# Patient Record
Sex: Female | Born: 1945 | Race: White | Smoking: Former smoker
Health system: Southern US, Community
[De-identification: ages and names within clinical notes are randomized; demographics above are authoritative.]

## PROBLEM LIST (undated history)

## (undated) DIAGNOSIS — M797 Fibromyalgia: Secondary | ICD-10-CM

## (undated) DIAGNOSIS — E785 Hyperlipidemia, unspecified: Secondary | ICD-10-CM

## (undated) DIAGNOSIS — D649 Anemia, unspecified: Secondary | ICD-10-CM

## (undated) DIAGNOSIS — E039 Hypothyroidism, unspecified: Secondary | ICD-10-CM

## (undated) DIAGNOSIS — K589 Irritable bowel syndrome without diarrhea: Secondary | ICD-10-CM

## (undated) DIAGNOSIS — F32A Depression, unspecified: Secondary | ICD-10-CM

## (undated) DIAGNOSIS — I10 Essential (primary) hypertension: Secondary | ICD-10-CM

## (undated) DIAGNOSIS — Z8719 Personal history of other diseases of the digestive system: Secondary | ICD-10-CM

## (undated) DIAGNOSIS — G709 Myoneural disorder, unspecified: Secondary | ICD-10-CM

## (undated) DIAGNOSIS — F419 Anxiety disorder, unspecified: Secondary | ICD-10-CM

## (undated) DIAGNOSIS — J449 Chronic obstructive pulmonary disease, unspecified: Secondary | ICD-10-CM

## (undated) DIAGNOSIS — F329 Major depressive disorder, single episode, unspecified: Secondary | ICD-10-CM

## (undated) DIAGNOSIS — E079 Disorder of thyroid, unspecified: Secondary | ICD-10-CM

## (undated) DIAGNOSIS — A0472 Enterocolitis due to Clostridium difficile, not specified as recurrent: Secondary | ICD-10-CM

## (undated) DIAGNOSIS — H269 Unspecified cataract: Secondary | ICD-10-CM

## (undated) HISTORY — DX: Anemia, unspecified: D64.9

## (undated) HISTORY — PX: FRACTURE SURGERY: SHX138

## (undated) HISTORY — DX: Hypothyroidism, unspecified: E03.9

## (undated) HISTORY — PX: DILATION AND CURETTAGE OF UTERUS: SHX78

## (undated) HISTORY — DX: Anxiety disorder, unspecified: F41.9

## (undated) HISTORY — DX: Personal history of other diseases of the digestive system: Z87.19

## (undated) HISTORY — PX: OTHER SURGICAL HISTORY: SHX169

## (undated) HISTORY — DX: Hyperlipidemia, unspecified: E78.5

## (undated) HISTORY — DX: Depression, unspecified: F32.A

## (undated) HISTORY — DX: Chronic obstructive pulmonary disease, unspecified: J44.9

## (undated) HISTORY — DX: Major depressive disorder, single episode, unspecified: F32.9

## (undated) HISTORY — PX: CLAVICLE SURGERY: SHX598

## (undated) HISTORY — DX: Fibromyalgia: M79.7

## (undated) HISTORY — DX: Myoneural disorder, unspecified: G70.9

## (undated) HISTORY — DX: Irritable bowel syndrome, unspecified: K58.9

---

## 2001-07-13 ENCOUNTER — Ambulatory Visit (HOSPITAL_COMMUNITY): Admission: RE | Admit: 2001-07-13 | Discharge: 2001-07-13 | Payer: Self-pay | Admitting: *Deleted

## 2002-05-09 ENCOUNTER — Ambulatory Visit (HOSPITAL_COMMUNITY): Admission: RE | Admit: 2002-05-09 | Discharge: 2002-05-09 | Payer: Self-pay | Admitting: Gastroenterology

## 2008-10-31 ENCOUNTER — Emergency Department (HOSPITAL_COMMUNITY): Admission: EM | Admit: 2008-10-31 | Discharge: 2008-10-31 | Payer: Self-pay | Admitting: Emergency Medicine

## 2009-10-15 ENCOUNTER — Ambulatory Visit: Payer: Self-pay | Admitting: Interventional Radiology

## 2009-10-15 ENCOUNTER — Emergency Department (HOSPITAL_BASED_OUTPATIENT_CLINIC_OR_DEPARTMENT_OTHER): Admission: EM | Admit: 2009-10-15 | Discharge: 2009-10-15 | Payer: Self-pay | Admitting: Emergency Medicine

## 2010-01-28 ENCOUNTER — Emergency Department (HOSPITAL_COMMUNITY): Admission: EM | Admit: 2010-01-28 | Discharge: 2010-01-28 | Payer: Self-pay | Admitting: Emergency Medicine

## 2010-02-21 ENCOUNTER — Ambulatory Visit: Payer: Self-pay | Admitting: Diagnostic Radiology

## 2010-02-21 ENCOUNTER — Emergency Department (HOSPITAL_BASED_OUTPATIENT_CLINIC_OR_DEPARTMENT_OTHER): Admission: EM | Admit: 2010-02-21 | Discharge: 2010-02-21 | Payer: Self-pay | Admitting: Emergency Medicine

## 2010-10-31 LAB — CBC
HCT: 42.6 % (ref 36.0–46.0)
HCT: 42.2 % (ref 36.0–46.0)
Hemoglobin: 14.5 g/dL (ref 12.0–15.0)
Hemoglobin: 14.7 g/dL (ref 12.0–15.0)
MCV: 91.7 fL (ref 78.0–100.0)
Platelets: 227 10*3/uL (ref 150–400)
RBC: 4.66 MIL/uL (ref 3.87–5.11)
RBC: 4.6 MIL/uL (ref 3.87–5.11)
RDW: 11.9 % (ref 11.5–15.5)

## 2010-10-31 LAB — DIFFERENTIAL
Basophils Absolute: 0.1 10*3/uL (ref 0.0–0.1)
Basophils Relative: 0 % (ref 0–1)
Basophils Relative: 1 % (ref 0–1)
Eosinophils Absolute: 0 10*3/uL (ref 0.0–0.7)
Eosinophils Relative: 0 % (ref 0–5)
Lymphocytes Relative: 10 % — ABNORMAL LOW (ref 12–46)
Lymphs Abs: 0.9 10*3/uL (ref 0.7–4.0)
Monocytes Absolute: 1 10*3/uL (ref 0.1–1.0)
Monocytes Relative: 10 % (ref 3–12)
Monocytes Relative: 7 % (ref 3–12)
Neutro Abs: 8.1 10*3/uL — ABNORMAL HIGH (ref 1.7–7.7)
Neutro Abs: 7.2 10*3/uL (ref 1.7–7.7)
Neutrophils Relative %: 80 % — ABNORMAL HIGH (ref 43–77)
Neutrophils Relative %: 82 % — ABNORMAL HIGH (ref 43–77)

## 2010-10-31 LAB — COMPREHENSIVE METABOLIC PANEL
ALT: 39 U/L — ABNORMAL HIGH (ref 0–35)
AST: 39 U/L — ABNORMAL HIGH (ref 0–37)
Albumin: 4.4 g/dL (ref 3.5–5.2)
Alkaline Phosphatase: 82 U/L (ref 39–117)
BUN: 11 mg/dL (ref 6–23)
BUN: 9 mg/dL (ref 6–23)
CO2: 27 mEq/L (ref 19–32)
CO2: 22 mEq/L (ref 19–32)
GFR calc Af Amer: >60 mL/min (ref >60)
GFR calc non Af Amer: >60 mL/min (ref >60)
Potassium: 4.3 mEq/L (ref 3.5–5.1)
Sodium: 137 mEq/L (ref 135–145)
Sodium: 137 mEq/L (ref 135–145)

## 2010-10-31 LAB — URINALYSIS, ROUTINE W REFLEX MICROSCOPIC
Bilirubin Urine: NEGATIVE
Glucose, UA: NEGATIVE mg/dL
Hgb urine dipstick: NEGATIVE
Ketones, ur: 15 mg/dL — AB
Nitrite: NEGATIVE
Protein, ur: NEGATIVE mg/dL
Specific Gravity, Urine: 1.011 (ref 1.005–1.030)

## 2010-10-31 LAB — GLUCOSE, CAPILLARY: Glucose-Capillary: 117 mg/dL — ABNORMAL HIGH (ref 70–99)

## 2010-10-31 LAB — HEMOCCULT GUIAC POC 1CARD (OFFICE): Fecal Occult Bld: NEGATIVE

## 2010-11-08 LAB — BASIC METABOLIC PANEL
BUN: 14 mg/dL (ref 6–23)
CO2: 27 mEq/L (ref 19–32)
Calcium: 9.2 mg/dL (ref 8.4–10.5)
Chloride: 105 mEq/L (ref 96–112)
Creatinine, Ser: 0.7 mg/dL (ref 0.4–1.2)
GFR calc Af Amer: >60 mL/min (ref >60)
GFR calc non Af Amer: >60 mL/min (ref >60)
Glucose, Bld: 104 mg/dL — ABNORMAL HIGH (ref 70–99)
Potassium: 4.2 mEq/L (ref 3.5–5.1)
Sodium: 142 mEq/L (ref 135–145)

## 2010-11-08 LAB — DIFFERENTIAL
Basophils Absolute: 0.2 10*3/uL — ABNORMAL HIGH (ref 0.0–0.1)
Monocytes Absolute: 1 10*3/uL (ref 0.1–1.0)
Monocytes Relative: 8 % (ref 3–12)

## 2010-11-08 LAB — CBC
HCT: 42.2 % (ref 36.0–46.0)
Hemoglobin: 14.4 g/dL (ref 12.0–15.0)
MCHC: 34.1 g/dL (ref 30.0–36.0)
MCV: 90.9 fL (ref 78.0–100.0)
Platelets: 217 10*3/uL (ref 150–400)
RBC: 4.64 MIL/uL (ref 3.87–5.11)
RDW: 12.1 % (ref 11.5–15.5)
WBC: 12.6 10*3/uL — ABNORMAL HIGH (ref 4.0–10.5)

## 2010-11-08 LAB — URINALYSIS, ROUTINE W REFLEX MICROSCOPIC
Glucose, UA: NEGATIVE mg/dL
Leukocytes, UA: NEGATIVE
Specific Gravity, Urine: 1.013 (ref 1.005–1.030)
Urobilinogen, UA: 0.2 mg/dL (ref 0.0–1.0)
pH: 5.5 (ref 5.0–8.0)

## 2010-11-08 LAB — URINE MICROSCOPIC-ADD ON

## 2010-11-08 LAB — URINE CULTURE

## 2010-11-25 LAB — CBC
HCT: 42.7 % (ref 36.0–46.0)
Platelets: 221 10*3/uL (ref 150–400)
RDW: 12.6 % (ref 11.5–15.5)
WBC: 11.1 10*3/uL — ABNORMAL HIGH (ref 4.0–10.5)

## 2010-11-25 LAB — BASIC METABOLIC PANEL
BUN: 12 mg/dL (ref 6–23)
Calcium: 9.5 mg/dL (ref 8.4–10.5)
Creatinine, Ser: 0.64 mg/dL (ref 0.4–1.2)
GFR calc non Af Amer: >60 mL/min (ref >60)
Glucose, Bld: 119 mg/dL — ABNORMAL HIGH (ref 70–99)
Potassium: 3.6 mEq/L (ref 3.5–5.1)

## 2010-11-25 LAB — DIFFERENTIAL
Basophils Absolute: 0 10*3/uL (ref 0.0–0.1)
Lymphocytes Relative: 6 % — ABNORMAL LOW (ref 12–46)
Lymphs Abs: 0.7 10*3/uL (ref 0.7–4.0)
Neutro Abs: 9.6 10*3/uL — ABNORMAL HIGH (ref 1.7–7.7)
Neutrophils Relative %: 87 % — ABNORMAL HIGH (ref 43–77)

## 2010-11-25 LAB — TROPONIN I: Troponin I: 0.01 ng/mL (ref 0.00–0.06)

## 2010-11-25 LAB — MAGNESIUM: Magnesium: 1.9 mg/dL (ref 1.5–2.5)

## 2010-11-25 LAB — BRAIN NATRIURETIC PEPTIDE: Pro B Natriuretic peptide (BNP): <30 pg/mL (ref 0.0–100.0)

## 2010-12-31 NOTE — Op Note (Signed)
   NAMEHALENA, MOHAR                         ACCOUNT NO.:  [id number]   MEDICAL RECORD NO.:  [id number]                   PATIENT TYPE:  AMB   LOCATION:  ENDO                                 FACILITY:  [facility]   PHYSICIAN:  James L. Malon Kindle., M.D.          DATE OF BIRTH:  1946-01-18   DATE OF PROCEDURE:  05/09/2002  DATE OF DISCHARGE:                                 OPERATIVE REPORT   PROCEDURE PERFORMED:  Colonoscopy.   ENDOSCOPIST:  Llana Aliment. Edwards, M.D.   MEDICATIONS:  Fentanyl 150 mcg, Versed 10 mg IV.   INSTRUMENT USED:  Pediatric Olympus video colonoscope.   INDICATIONS FOR PROCEDURE:  Rectal bleeding in a 65 year old with a very  strong family history of colon cancer.   DESCRIPTION OF PROCEDURE:  The procedure had been explained to the patient  and consent obtained.  With the patient in the left lateral decubitus  position, the pediatric Olympus video colonoscope was inserted and advanced  under direct visualization.  The prep was excellent and we were able to  reach the cecum.  The ileocecal valve and appendiceal orifice were seen.  The scope was withdrawn and the cecum, ascending colon, hepatic flexure,  transverse colon, splenic flexure, descending and sigmoid colon were seen  well.  Moderate diverticulosis throughout the entire left colon.  The scope  was withdrawn down to the rectum.  Internal hemorrhoids in the rectum.  The  scope was withdrawn.  The patient tolerated the procedure well, maintained  on low-flow oxygen and pulse oximeter throughout the procedure.   ASSESSMENT:  1. Rectal bleeding probably due to internal hemorrhoids.  2. Diverticulosis.  3. Family history of colon cancer.   PLAN:  Will follow up in the office as needed.  Give hemorrhoid sheet and  recommend repeating in five years.                                                James L. Malon Kindle., M.D.    Waldron Session  D:  05/09/2002  T:  05/09/2002  Job:  [id number]   cc:   Molly Maduro L.  Foy Gu[place], M.D.  [address] [place]  [place] [address]  Fax: [phone]

## 2011-02-28 IMAGING — CR DG CHEST 2V
2 series · 2 of 2 positions shown · non-contrast
Comparison: None

CLINICAL DATA: Short of breath and dizziness.

CHEST - 2 VIEW

[w chest pa]
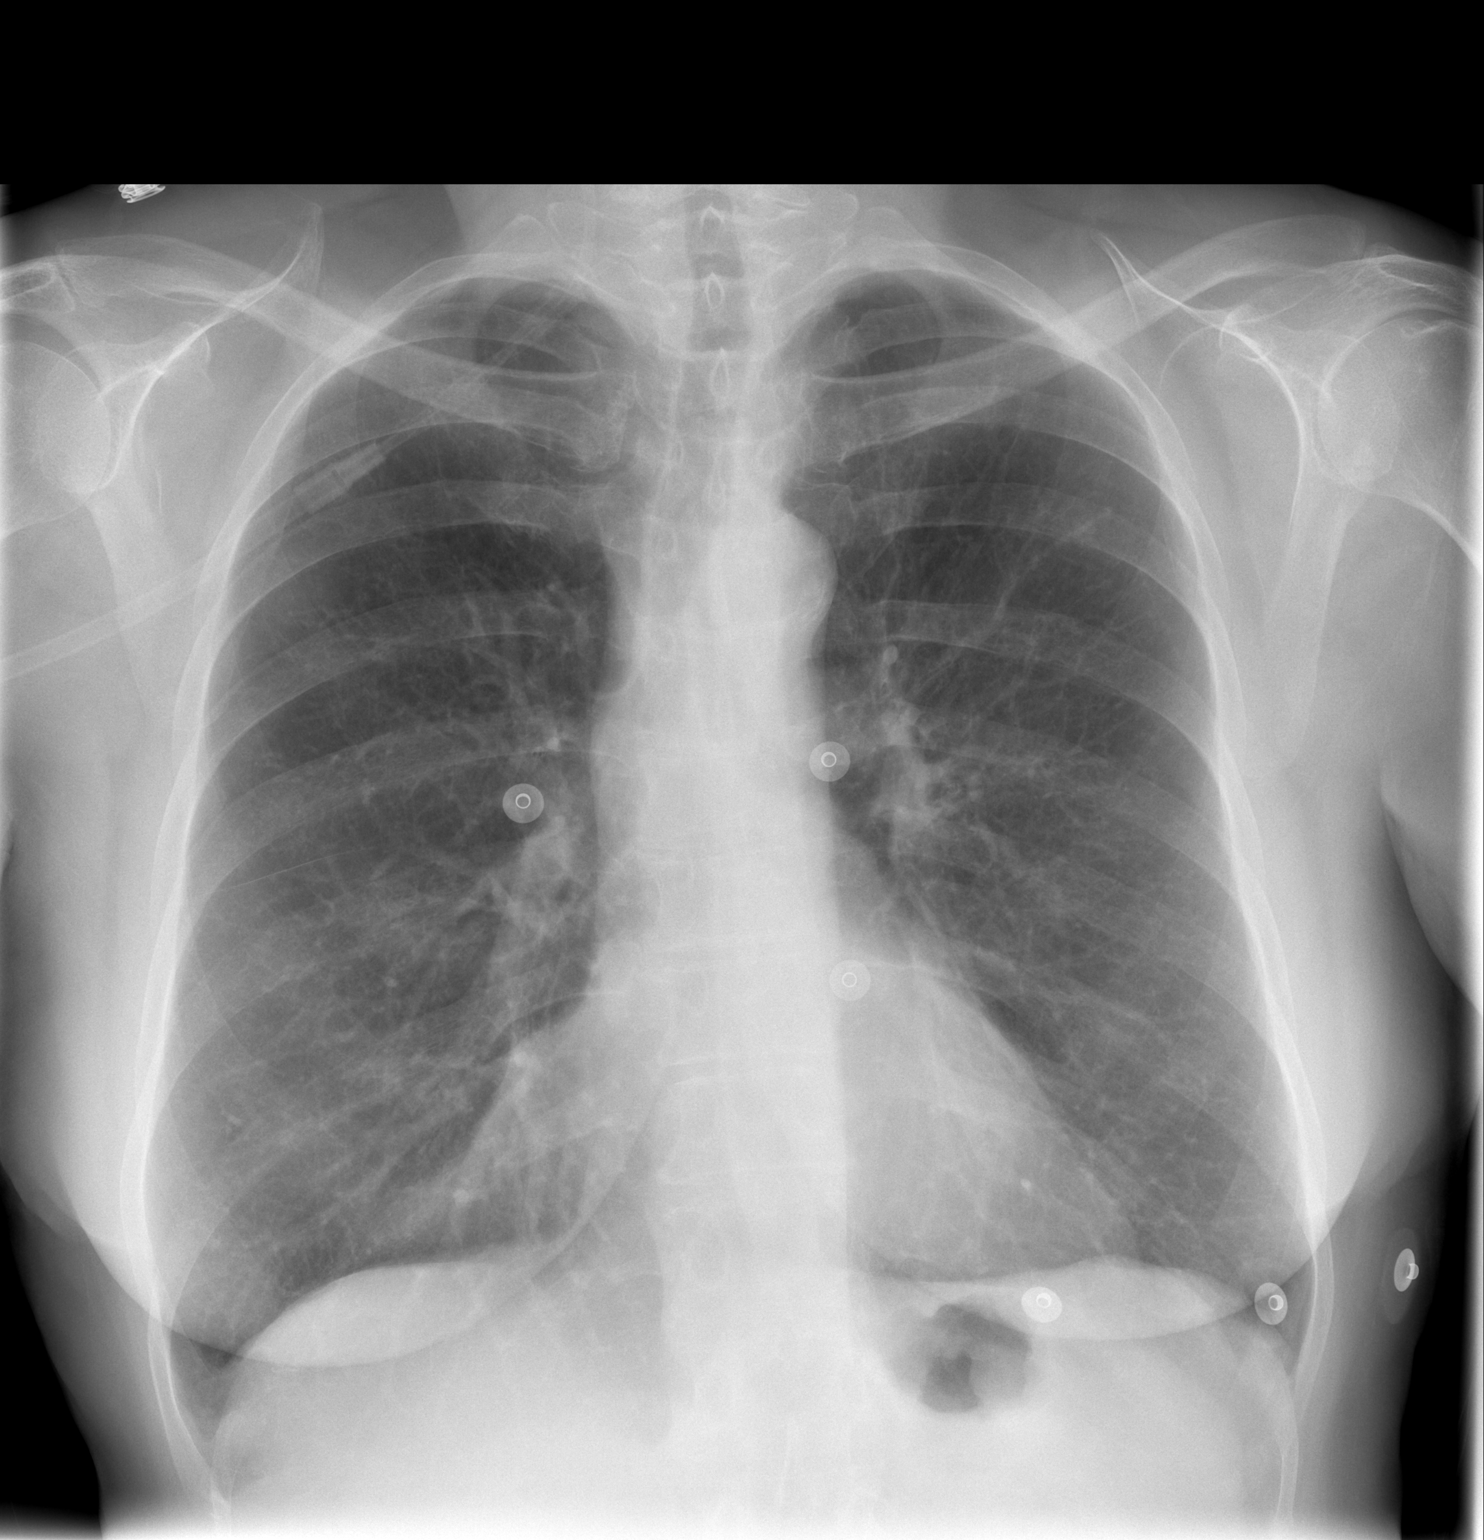

[w chest lat]
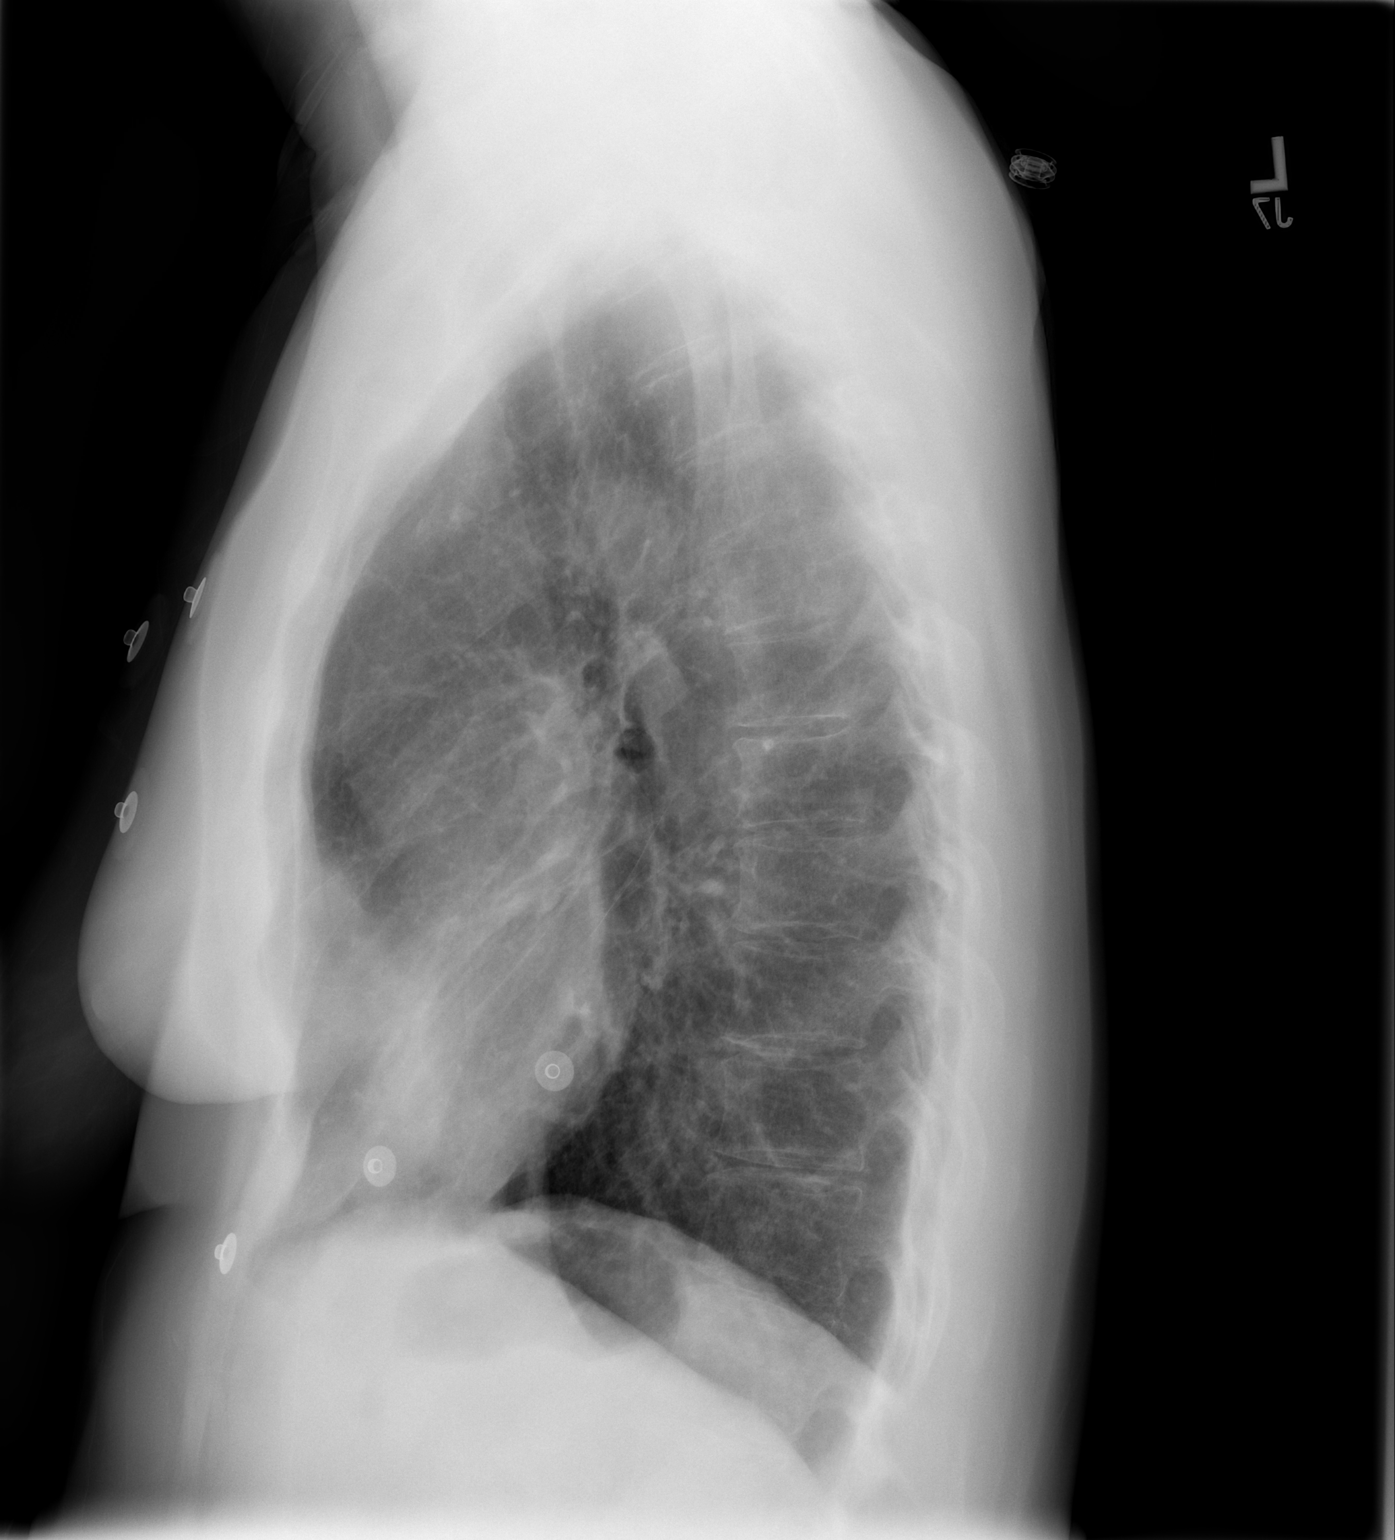

[2 of 2 positions shown; findings below may reference images not displayed]

FINDINGS: Heart size is normal and the vascularity is normal.  The
lungs show mild hyperinflation due to chronic lung disease.
Question early infiltrate in the right middle lobe best seen on the
lateral view.  No prior studies available for comparison to
determine if this is an acute finding.
IMPRESSION: COPD.

Possible early infiltrate in the right middle lobe.  This could
also be due to scarring or atelectasis.

## 2013-04-12 ENCOUNTER — Encounter: Payer: Self-pay | Admitting: *Deleted

## 2013-04-12 ENCOUNTER — Emergency Department (INDEPENDENT_AMBULATORY_CARE_PROVIDER_SITE_OTHER)
Admission: EM | Admit: 2013-04-12 | Discharge: 2013-04-12 | Disposition: A | Discharge status: Home / Self Care | Payer: Medicare Other | Source: Home / Self Care | Attending: Family Medicine | Admitting: Family Medicine

## 2013-04-12 ENCOUNTER — Emergency Department (INDEPENDENT_AMBULATORY_CARE_PROVIDER_SITE_OTHER): Payer: Medicare Other

## 2013-04-12 ENCOUNTER — Ambulatory Visit (INDEPENDENT_AMBULATORY_CARE_PROVIDER_SITE_OTHER): Payer: Medicare Other | Admitting: Sports Medicine

## 2013-04-12 DIAGNOSIS — M25519 Pain in unspecified shoulder: Secondary | ICD-10-CM

## 2013-04-12 DIAGNOSIS — M7710 Lateral epicondylitis, unspecified elbow: Secondary | ICD-10-CM

## 2013-04-12 DIAGNOSIS — M19019 Primary osteoarthritis, unspecified shoulder: Secondary | ICD-10-CM

## 2013-04-12 DIAGNOSIS — M778 Other enthesopathies, not elsewhere classified: Secondary | ICD-10-CM

## 2013-04-12 DIAGNOSIS — M25529 Pain in unspecified elbow: Secondary | ICD-10-CM

## 2013-04-12 DIAGNOSIS — M25511 Pain in right shoulder: Secondary | ICD-10-CM

## 2013-04-12 DIAGNOSIS — M7711 Lateral epicondylitis, right elbow: Secondary | ICD-10-CM | POA: Insufficient documentation

## 2013-04-12 HISTORY — DX: Disorder of thyroid, unspecified: E07.9

## 2013-04-12 HISTORY — DX: Essential (primary) hypertension: I10

## 2013-04-12 MED ORDER — MELOXICAM 15 MG PO TABS: ORAL_TABLET | ORAL | Status: DC

## 2013-04-12 NOTE — ED Notes (Addendum)
[place] c/o right arm and elbow pain x 2 weeks without injury. Pain is constant and worse with movement.

## 2013-04-12 NOTE — Progress Notes (Signed)
   Subjective:    I'm seeing this patient as a consultation for:  Dr. Alvester Morin  CC: Right elbow pain  HPI: This is a very pleasant 67 year old female who I've been asked to consult on by Dr. Alvester Morin, she has had a several week history of pain that she localizes over her right lateral upper condyle, without any change in her usual level of activity. Pain is localized, doesn't radiate, mild to moderate. She has not yet tried any medications for this. No pain in the neck, no numbness or tingling in the fingers. She also has some mild pain over the deltoid in her shoulder with overhead activities that she would like to discuss it later date.  Past medical history, Surgical history, Family history not pertinant except as noted below, Social history, Allergies, and medications have been entered into the medical record, reviewed, and no changes needed.   Review of Systems: No headache, visual changes, nausea, vomiting, diarrhea, constipation, dizziness, abdominal pain, skin rash, fevers, chills, night sweats, weight loss, swollen lymph nodes, body aches, joint swelling, muscle aches, chest pain, shortness of breath, mood changes, visual or auditory hallucinations.   Objective:   General: Well Developed, well nourished, and in no acute distress.  Neuro/Psych: Alert and oriented x3, extra-ocular muscles intact, able to move all 4 extremities, sensation grossly intact. Skin: Warm and dry, no rashes noted.  Respiratory: Not using accessory muscles, speaking in full sentences, trachea midline.  Cardiovascular: Pulses palpable, no extremity edema. Abdomen: Does not appear distended. Right Elbow: Unremarkable to inspection. Range of motion full pronation, supination, flexion, extension. Strength is full to all of the above directions Stable to varus, valgus stress. Negative moving valgus stress test. Tender to palpation over the common extensor tendon origin. Ulnar nerve does not sublux. Negative cubital  tunnel Tinel's.  X-rays were reviewed and are negative.  Impression and Recommendations:   This case required medical decision making of moderate complexity.

## 2013-04-12 NOTE — Assessment & Plan Note (Signed)
We will start conservatively with an elbow brace, Mobic, home exercises, she has some Lidoderm patches but she wants to put on which I think is appropriate. She'll come back to see me on an as-needed basis in 2 weeks and we can consider injection if no better.  She also had some right shoulder pain which we can address it at future visit.

## 2013-04-12 NOTE — ED Provider Notes (Signed)
CSN: [id number]     Arrival date & time 04/12/13  1310 History   First MD Initiated Contact with Patient 04/12/13 1311     Chief Complaint  Patient presents with  . Arm Pain    HPI  R elbow and shoulder pain x 1 week  No known injury.  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  Pain predominantly in R lateral elbow.  Minimal radicular sxs.  Grip strength intact.   Past Medical History  Diagnosis Date  . Hypertension   . Thyroid disease    History reviewed. No pertinent past surgical history. Family History  Problem Relation Age of Onset  . Cancer Mother     breast CA  . Diabetes Father   . Hypertension Father    History  Substance Use Topics  . Smoking status: Never Smoker   . Smokeless tobacco: Never Used  . Alcohol Use: No   OB History   Grav Para Term Preterm Abortions TAB SAB Ect Mult Living                 Review of Systems  All other systems reviewed and are negative.    Allergies  Demerol and Erythromycin  Home Medications   Current Outpatient Rx  Name  Route  Sig  Dispense  Refill  . busPIRone (BUSPAR) 15 MG tablet   Oral   Take 15 mg by mouth 3 (three) times daily.         . hydrALAZINE (APRESOLINE) 10 MG tablet   Oral   Take 10 mg by mouth 3 (three) times daily.         Marland Kitchen levothyroxine (SYNTHROID, LEVOTHROID) 50 MCG tablet   Oral   Take 50 mcg by mouth daily before breakfast.         . losartan (COZAAR) 100 MG tablet   Oral   Take 100 mg by mouth daily.          BP 176/75  Pulse 95  Resp 14  Ht 5' 7.5" (1.715 m)  Wt 163 lb (73.936 kg)  BMI 25.14 kg/m2  SpO2 100% Physical Exam  Constitutional: She appears well-developed and well-nourished.  HENT:  Head: Normocephalic and atraumatic.  Eyes: Conjunctivae are normal. Pupils are equal, round, and reactive to light.  Neck: Normal range of motion.  Cardiovascular: Regular rhythm.   Pulmonary/Chest: Effort normal and breath sounds normal.  Abdominal: Soft.  Musculoskeletal:     Arms: Neurological: She is alert.    ED Course  Procedures (including critical care time) Labs Review Labs Reviewed - No data to display Imaging Review Dg Shoulder Right  04/12/2013   CLINICAL DATA:  Shoulder pain.  EXAM: RIGHT SHOULDER - 2+ VIEW  COMPARISON:  None.  FINDINGS: The bones appear mildly demineralized. There is no evidence of acute fracture or dislocation. The subacromial space is preserved. There are acromioclavicular degenerative changes.  IMPRESSION: Acromioclavicular degenerative changes. No acute osseous findings or malalignment.   Electronically Signed   By: Roxy Horseman   On: 04/12/2013 14:06   Dg Elbow Complete Right  04/12/2013   CLINICAL DATA:  Elbow pain for 2 weeks. No known injury.  EXAM: RIGHT ELBOW - COMPLETE 3+ VIEW  COMPARISON:  None.  FINDINGS: The bones appear mildly demineralized. The joint spaces are preserved. There is no evidence of acute fracture, dislocation or elbow joint effusion.  IMPRESSION: No acute osseous findings.   Electronically Signed   By: Roxy Horseman   On:  04/12/2013 14:07    MDM   1. Pain in joint, shoulder region, right   2. Lateral epicondylitis  of elbow   3. Triceps tendonitis    Noted multi joint/tendon involvement.  Likely over use injury.  Pt reports multiple medication allergies including NSAIDs.  Will consult sports medicine to help with management.  Treatment and follow up per sports medicine.     The patient and/or caregiver has been counseled thoroughly with regard to treatment plan and/or medications prescribed including dosage, schedule, interactions, rationale for use, and possible side effects and they verbalize understanding. Diagnoses and expected course of recovery discussed and will return if not improved as expected or if the condition worsens. Patient and/or caregiver verbalized understanding.         Doree Albee, MD 04/12/13 [phone]

## 2013-07-16 ENCOUNTER — Encounter (HOSPITAL_BASED_OUTPATIENT_CLINIC_OR_DEPARTMENT_OTHER): Payer: Self-pay | Admitting: Emergency Medicine

## 2013-07-16 ENCOUNTER — Emergency Department (HOSPITAL_BASED_OUTPATIENT_CLINIC_OR_DEPARTMENT_OTHER)
Admission: EM | Admit: 2013-07-16 | Discharge: 2013-07-16 | Disposition: A | Discharge status: Home / Self Care | Payer: Medicare Other | Attending: Emergency Medicine | Admitting: Emergency Medicine

## 2013-07-16 ENCOUNTER — Emergency Department (HOSPITAL_BASED_OUTPATIENT_CLINIC_OR_DEPARTMENT_OTHER): Payer: Medicare Other

## 2013-07-16 DIAGNOSIS — IMO0002 Reserved for concepts with insufficient information to code with codable children: Secondary | ICD-10-CM | POA: Insufficient documentation

## 2013-07-16 DIAGNOSIS — Z79899 Other long term (current) drug therapy: Secondary | ICD-10-CM | POA: Insufficient documentation

## 2013-07-16 DIAGNOSIS — J40 Bronchitis, not specified as acute or chronic: Secondary | ICD-10-CM

## 2013-07-16 DIAGNOSIS — R079 Chest pain, unspecified: Secondary | ICD-10-CM | POA: Insufficient documentation

## 2013-07-16 DIAGNOSIS — E079 Disorder of thyroid, unspecified: Secondary | ICD-10-CM | POA: Insufficient documentation

## 2013-07-16 DIAGNOSIS — I10 Essential (primary) hypertension: Secondary | ICD-10-CM | POA: Insufficient documentation

## 2013-07-16 LAB — TROPONIN I: Troponin I: <0.3 ng/mL (ref <0.30)

## 2013-07-16 LAB — CBC WITH DIFFERENTIAL/PLATELET
Eosinophils Absolute: 0 10*3/uL (ref 0.0–0.7)
Hemoglobin: 13.9 g/dL (ref 12.0–15.0)
Lymphs Abs: 1.2 10*3/uL (ref 0.7–4.0)
MCH: 31.2 pg (ref 26.0–34.0)
Monocytes Relative: 10 % (ref 3–12)
Neutro Abs: 6.8 10*3/uL (ref 1.7–7.7)
Neutrophils Relative %: 76 % (ref 43–77)
Platelets: 232 10*3/uL (ref 150–400)
RBC: 4.46 MIL/uL (ref 3.87–5.11)
WBC: 9 10*3/uL (ref 4.0–10.5)

## 2013-07-16 LAB — BASIC METABOLIC PANEL
BUN: 15 mg/dL (ref 6–23)
Chloride: 101 mEq/L (ref 96–112)
GFR calc Af Amer: >90 mL/min (ref >90)
GFR calc non Af Amer: 87 mL/min — ABNORMAL LOW (ref >90)
Glucose, Bld: 113 mg/dL — ABNORMAL HIGH (ref 70–99)
Potassium: 3.7 mEq/L (ref 3.5–5.1)
Sodium: 137 mEq/L (ref 135–145)

## 2013-07-16 MED ORDER — PREDNISONE 20 MG PO TABS: 60.0000 mg | ORAL_TABLET | Freq: Every day | ORAL | Status: DC

## 2013-07-16 MED ORDER — IOHEXOL 350 MG/ML SOLN
100.0000 mL | Freq: Once | INTRAVENOUS | Status: AC | PRN
  Administered 2013-07-16: 100 mL via INTRAVENOUS

## 2013-07-16 MED ORDER — PREDNISONE 50 MG PO TABS
60.0000 mg | ORAL_TABLET | Freq: Once | ORAL | Status: AC
  Administered 2013-07-16: 60 mg via ORAL
  Filled 2013-07-16 (×2): qty 1

## 2013-07-16 MED ORDER — ALBUTEROL SULFATE HFA 108 (90 BASE) MCG/ACT IN AERS
2.0000 | INHALATION_SPRAY | Freq: Once | RESPIRATORY_TRACT | Status: AC
  Administered 2013-07-16: 2 via RESPIRATORY_TRACT
  Filled 2013-07-16: qty 6.7

## 2013-07-16 MED ORDER — ALBUTEROL SULFATE HFA 108 (90 BASE) MCG/ACT IN AERS: 2.0000 | INHALATION_SPRAY | RESPIRATORY_TRACT | Status: DC | PRN

## 2013-07-16 MED ORDER — ONDANSETRON HCL 4 MG/2ML IJ SOLN
4.0000 mg | Freq: Once | INTRAMUSCULAR | Status: AC
  Administered 2013-07-16: 4 mg via INTRAVENOUS
  Filled 2013-07-16: qty 2

## 2013-07-16 NOTE — ED Notes (Signed)
Pt returns from ct scan, states she feels "shakey". Pt given warm blankets, denies any other c/o..."nothing other than my usual aches and pains that I have every day.Marland KitchenMarland Kitchen"

## 2013-07-16 NOTE — ED Notes (Signed)
Patient transported to CT 

## 2013-07-16 NOTE — ED Notes (Signed)
Assisted Pt to restroom, Pt ambulated to restroom and then I place Pt in wheel chair from the restroom to Pts room

## 2013-07-16 NOTE — ED Provider Notes (Signed)
CSN: [id number]     Arrival date & time 07/16/13  1403 History   First MD Initiated Contact with Patient 07/16/13 1500     Chief Complaint  Patient presents with  . Shortness of Breath  . pain under left breast radiates to back    (Consider location/radiation/quality/duration/timing/severity/associated sxs/prior Treatment) Patient is a 67 y.o. female presenting with shortness of breath.  Shortness of Breath Associated symptoms: chest pain   Associated symptoms: no abdominal pain, no cough, no fever and no vomiting     This is a 67 year old female who presents with shortness of breath and chest pain. Patient reports onset of pain last night at approximately 10 PM. She reports pain under her left breast that radiates to her back. It is pleuritic and sharp. Patient also reports left lower extremity "charley horses." She denies any swelling or history of blood clots but does have family history of blood clots. Patient has a history of hypertension and an early family history of heart disease. She is a former smoker. Currently she rates her pain a 6/10.  She denies any recent fevers or cough.  Past Medical History  Diagnosis Date  . Hypertension   . Thyroid disease    History reviewed. No pertinent past surgical history. Family History  Problem Relation Age of Onset  . Cancer Mother     breast CA  . Diabetes Father   . Hypertension Father    History  Substance Use Topics  . Smoking status: Never Smoker   . Smokeless tobacco: Never Used  . Alcohol Use: No   OB History   Grav Para Term Preterm Abortions TAB SAB Ect Mult Living                 Review of Systems  Constitutional: Negative for fever.  Respiratory: Positive for shortness of breath. Negative for cough.   Cardiovascular: Positive for chest pain. Negative for leg swelling.  Gastrointestinal: Negative for nausea, vomiting and abdominal pain.  Genitourinary: Negative for dysuria.    Allergies  Clindamycin/lincomycin;  Demerol; Erythromycin; and Sulfa antibiotics  Home Medications   Current Outpatient Rx  Name  Route  Sig  Dispense  Refill  . cyclobenzaprine (FLEXERIL) 10 MG tablet   Oral   Take 10 mg by mouth 3 (three) times daily as needed for muscle spasms.         Marland Kitchen albuterol (PROVENTIL HFA;VENTOLIN HFA) 108 (90 BASE) MCG/ACT inhaler   Inhalation   Inhale 2 puffs into the lungs every 4 (four) hours as needed for wheezing or shortness of breath.   1 Inhaler   0   . busPIRone (BUSPAR) 15 MG tablet   Oral   Take 15 mg by mouth 3 (three) times daily.         . hydrALAZINE (APRESOLINE) 10 MG tablet   Oral   Take 10 mg by mouth 3 (three) times daily.         Marland Kitchen levothyroxine (SYNTHROID, LEVOTHROID) 50 MCG tablet   Oral   Take 50 mcg by mouth daily before breakfast.         . losartan (COZAAR) 100 MG tablet   Oral   Take 100 mg by mouth daily.         . meloxicam (MOBIC) 15 MG tablet      One tab PO qAM with breakfast for 2 weeks, then daily prn pain.   30 tablet   3   . predniSONE (DELTASONE) 20 MG tablet  Oral   Take 3 tablets (60 mg total) by mouth daily.   12 tablet   0    BP 174/83  Pulse 89  Temp(Src) 98 F (36.7 C) (Oral)  Resp 20  Ht 5\' 7"  (1.702 m)  Wt 162 lb (73.483 kg)  BMI 25.37 kg/m2  SpO2 95% Physical Exam  Nursing note and vitals reviewed. Constitutional: She is oriented to person, place, and time. She appears well-developed and well-nourished.  HENT:  Head: Normocephalic and atraumatic.  Eyes: Pupils are equal, round, and reactive to light.  Neck: Neck supple.  Cardiovascular: Normal rate, regular rhythm and normal heart sounds.   No murmur heard. Pulmonary/Chest: Effort normal. No respiratory distress. She has wheezes.  Scant expiratory wheezing in the bases  Abdominal: Soft. Bowel sounds are normal. There is no tenderness. There is no rebound.  Musculoskeletal: She exhibits no edema.  Tenderness to palpation of the left calf, no  asymmetrical swelling noted, good DP pulses  Neurological: She is alert and oriented to person, place, and time.  Skin: Skin is warm and dry.  Psychiatric: She has a normal mood and affect.    ED Course  Procedures (including critical care time) Labs Review Labs Reviewed  BASIC METABOLIC PANEL - Abnormal; Notable for the following:    Glucose, Bld 113 (*)    GFR calc non Af Amer 87 (*)    All other components within normal limits  CBC WITH DIFFERENTIAL  TROPONIN I  TROPONIN I   Imaging Review Dg Chest 2 View  07/16/2013   CLINICAL DATA:  History of hypertension now with chest pain and dyspnea  EXAM: CHEST  2 VIEW  COMPARISON:  Chest x-ray of November 10, 2012  FINDINGS: The lungs remain hyperinflated consistent with COPD. The cardiac silhouette is normal in size. The pulmonary vascularity is not engorged. The mediastinum is normal in width. Stable coarse lung markings are present in the right infrahilar region most compatible with pulmonary vessels. There is no pleural effusion or pneumothorax. The cardiopericardial silhouette is normal in size. The trachea is midline. The observed portions of the bony thorax exhibit no acute abnormalities.  IMPRESSION: There is hyperinflation consistent with COPD. There is no evidence of pneumonia nor CHF. I cannot exclude acute bronchitis in the appropriate clinical setting.   Electronically Signed   By: David  [place]   On: 07/16/2013 15:05   Ct Angio Chest W/cm &/or Wo Cm  07/16/2013   CLINICAL DATA:  Chest pain, back pain, and shortness of breath.  EXAM: CT ANGIOGRAPHY CHEST WITH CONTRAST  TECHNIQUE: Multidetector CT imaging of the chest was performed using the standard protocol during bolus administration of intravenous contrast. Multiplanar CT image reconstructions including MIPs were obtained to evaluate the vascular anatomy.  CONTRAST:  OMNIPAQUE IOHEXOL 350 MG/ML SOLN  COMPARISON:  Chest x-ray dated 07/16/2013  FINDINGS: There are no pulmonary  emboli, infiltrates, or effusions. RV/ LV ratio is normal at 0.77. The patient has diffuse emphysematous changes primarily in the upper lobes. Heart size is normal. No appreciable coronary artery calcification.  No acute osseous abnormality.  Degenerative disc disease at T8-9.  Tiny intrapulmonary nodes are seen along the left hemidiaphragm and along the right minor fissure. These are not felt to be significant. No adenopathy or mass lesions. Right lobe of the thyroid gland is slightly prominent but there is no definable nodule.  Review of the MIP images confirms the above findings.  IMPRESSION: 1. No pulmonary emboli or other acute  abnormalities. 2. Emphysema.   Electronically Signed   By: Geanie Cooley M.D.   On: 07/16/2013 17:51    EKG Interpretation    Date/Time:  Tuesday July 16 2013 14:23:46 EST Ventricular Rate:  103 PR Interval:  150 QRS Duration: 76 QT Interval:  338 QTC Calculation: 442 R Axis:   80 Text Interpretation:  Sinus tachycardia Biatrial enlargement Abnormal ECG Tachycardia present now when compared to prior Confirmed by Allaya Abbasi  MD, Hien Cunliffe ([id number]) on 07/16/2013 11:34:27 PM            MDM   1. Bronchitis   2. Chest pain    Patient presents with shortness of breath and chest pain. She is nontoxic-appearing on exam. Initial vital signs are notable for pulse of 102 and a blood pressure of 191/80. Considerations include PE, ACS, COPD (although the patient denies any formal diagnosis of COPD).  Patient has a moderate Wells risk score. For this reason she was sent for T. CT scan. Initial troponin is negative. EKG is nonischemic and shows sinus tachycardia. Chest x-ray shows evidence of hyperinflation consistent with COPD. CT scan is negative for PE but did show evidence of emphysema.  Patient was given albuterol and prednisone. Feel her chest pain may be secondary to COPD exacerbation given wheezing and imaging evidence of emphysema. Delta troponin is reassuring.   Patient has risk factors of early family history and hypertension. Her TIMI risk score is 1. I discussed with the patient and her family that I felt her chest pain shortness of breath was likely secondary to bronchitis in the setting of COPD. However, she does have risk factors for ACS and I feel she should have close followup with stress testing. Patient states understanding. She will call cardiology tomorrow to set up stress testing. Patient was given prednisone and albuterol for discharge. She was also instructed to followup with her primary care physician regarding formal testing for COPD.  After history, exam, and medical workup I feel the patient has been appropriately medically screened and is safe for discharge home. Pertinent diagnoses were discussed with the patient. Patient was given return precautions.     Shon Baton, MD 07/16/13 2337

## 2013-07-25 ENCOUNTER — Ambulatory Visit (INDEPENDENT_AMBULATORY_CARE_PROVIDER_SITE_OTHER): Payer: Medicare Other | Admitting: Cardiology

## 2013-07-25 ENCOUNTER — Encounter: Payer: Self-pay | Admitting: Cardiology

## 2013-07-25 VITALS — BP 166/72 | HR 80 | Ht 67.0 in | Wt 163.0 lb

## 2013-07-25 DIAGNOSIS — R079 Chest pain, unspecified: Secondary | ICD-10-CM

## 2013-07-25 DIAGNOSIS — I82409 Acute embolism and thrombosis of unspecified deep veins of unspecified lower extremity: Secondary | ICD-10-CM

## 2013-07-25 MED ORDER — FLUTICASONE-SALMETEROL 250-50 MCG/DOSE IN AEPB: INHALATION_SPRAY | RESPIRATORY_TRACT | Status: DC

## 2013-07-25 MED ORDER — CLONIDINE HCL 0.2 MG PO TABS: 0.2000 mg | ORAL_TABLET | Freq: Two times a day (BID) | ORAL | Status: DC

## 2013-07-25 NOTE — Patient Instructions (Addendum)
Your physician has requested that you have en exercise stress myoview. For further information please visit [email or web address] Please follow instruction sheet, as given.  Your physician has recommended you make the following change in your medication: inhale 2 puffs of Advair twice daily and take Clonidine 0.2 mg twice daily  Your physician has requested that you have a lower or upper extremity venous duplex. This test is an ultrasound of the veins in the legs or arms. It looks at venous blood flow that carries blood from the heart to the legs or arms. Allow one hour for a Lower Venous exam. Allow thirty minutes for an Upper Venous exam. There are no restrictions or special instructions.  Your physician recommends that you schedule a follow-up appointment in: 3 weeks

## 2013-07-25 NOTE — Progress Notes (Signed)
Patient ID: Kalei Meda, female   DOB: 1945-11-15, 67 y.o.   MRN: [id number]     Patient Name: Megan Glover Date of Encounter: 07/25/2013  Primary Care Provider:  Lenora Boys, MD Primary Cardiologist:  Tobias Alexander, H   Problem List   Past Medical History  Diagnosis Date  . Hypertension   . Thyroid disease    No past surgical history on file.  Allergies  Allergies  Allergen Reactions  . Clindamycin/Lincomycin   . Demerol [Meperidine]   . Erythromycin   . Sulfa Antibiotics     HPI  This is a 67 year old female with poorly controlled HTN who presented to the ER on 07/12/13 with with shortness of breath and chest pain. The pain started at rest, it was under her left breast that radiated to her back. It was pleuritic and sharp. Patient also reports left lower extremity "charley horses" and left lower extremity pain. She denies any swelling or history of blood clots but does have family history of blood clots. CT chest in the ER was negative for pulmonary embolism. Patient has a history of hypertension and an early family history of heart disease. She is a former smoker.   Her pain resolved in the ER, she was found to be wheezing and started on Prednisone and Albuterol for COPD exacerbation. The patient complains of severely elevated BP and states that she stopped taking Prednisone as she felt it was elevating her BP.  She admits to DOE after mild exertion. She has episodic left sided sharp chest pains not related to exertion.    Home Medications  Prior to Admission medications   Medication Sig Start Date End Date Taking? Authorizing Provider  albuterol (PROVENTIL HFA;VENTOLIN HFA) 108 (90 BASE) MCG/ACT inhaler Inhale 2 puffs into the lungs every 4 (four) hours as needed for wheezing or shortness of breath. 07/16/13  Yes Shon Baton, MD  busPIRone (BUSPAR) 15 MG tablet Take 15 mg by mouth 3 (three) times daily.   Yes Historical Provider, MD  cyclobenzaprine  (FLEXERIL) 10 MG tablet Take 10 mg by mouth 3 (three) times daily as needed for muscle spasms.   Yes Historical Provider, MD  hydrALAZINE (APRESOLINE) 10 MG tablet Take 10 mg by mouth 3 (three) times daily.   Yes Historical Provider, MD  levothyroxine (SYNTHROID, LEVOTHROID) 50 MCG tablet Take 50 mcg by mouth daily before breakfast.   Yes Historical Provider, MD  losartan (COZAAR) 100 MG tablet Take 100 mg by mouth daily.   Yes Historical Provider, MD  meloxicam (MOBIC) 15 MG tablet One tab PO qAM with breakfast for 2 weeks, then daily prn pain. 04/12/13  Yes Monica Becton, MD  predniSONE (DELTASONE) 20 MG tablet Take 3 tablets (60 mg total) by mouth daily. 07/16/13  Yes Shon Baton, MD    Family History  Family History  Problem Relation Age of Onset  . Cancer Mother     breast CA  . Diabetes Father   . Hypertension Father     Social History  History   Social History  . Marital Status: Married    Spouse Name: N/A    Number of Children: N/A  . Years of Education: N/A   Occupational History  . Not on file.   Social History Main Topics  . Smoking status: Never Smoker   . Smokeless tobacco: Never Used  . Alcohol Use: No  . Drug Use: No  . Sexual Activity: Not on file   Other Topics  Concern  . Not on file   Social History Narrative  . No narrative on file     Review of Systems, as per HPI, otherwise negative General:  No chills, fever, night sweats or weight changes.  Cardiovascular:  No chest pain, dyspnea on exertion, edema, orthopnea, palpitations, paroxysmal nocturnal dyspnea. Dermatological: No rash, lesions/masses Respiratory: No cough, dyspnea Urologic: No hematuria, dysuria Abdominal:   No nausea, vomiting, diarrhea, bright red blood per rectum, melena, or hematemesis Neurologic:  No visual changes, wkns, changes in mental status. All other systems reviewed and are otherwise negative except as noted above.  Physical Exam  Blood pressure 166/72,  pulse 80, height 5\' 7"  (1.702 m), weight 163 lb (73.936 kg).  General: Pleasant, NAD Psych: Normal affect. Neuro: Alert and oriented X 3. Moves all extremities spontaneously. HEENT: Normal  Neck: Supple without bruits or JVD. Lungs:  Resp regular and unlabored, wheezing B/L Heart: RRR no s3, s4, or murmurs. Abdomen: Soft, non-tender, non-distended, BS + x 4.  Extremities: No clubbing, cyanosis LLE edema up to the knee, bruising. DP/PT/Radials 2+ and equal bilaterally.  Labs:  No results found for this basename: CKTOTAL, CKMB, TROPONINI,  in the last 72 hours Lab Results  Component Value Date   WBC 9.0 07/16/2013   HGB 13.9 07/16/2013   HCT 40.3 07/16/2013   MCV 90.4 07/16/2013   PLT 232 07/16/2013   No results found for this basename: NA, K, CL, CO2, BUN, CREATININE, CALCIUM, LABALBU, PROT, BILITOT, ALKPHOS, ALT, AST, GLUCOSE,  in the last 168 hours No results found for this basename: CHOL, HDL, LDLCALC, TRIG   No results found for this basename: DDIMER   No components found with this basename: POCBNP,   Accessory Clinical Findings  echocardiogram  ECG - sinus rhythm, 93 beats per minute, biatrial enlargement, 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 Atypical chest pain - force factor including her smoking, poorly controlled hypertension, hyperlipidemia significant family history of CAD.   We will schedule a treadmill nuclear stress test dyspnea however do main focus right now should be on treating her hypertensive urgency  2. Hypertension - poorly controlled with hypertensive urgency 10 days ago, patient states that she was intolerant to multiple medication in the past she believes that medication including carvedilol, amlodipine, and bisoprolol were causing her significant hair loss and she states that she would rather have a stroke with high blood  pressure did be out of hair. We will start her on clonidine 0.2 mg twice a day and recheck in 3 weeks. If we cannot control her blood pressure we might consider reanal artery ultrasound, however her kidney function is completely normal so it's rather unlikely and we will wait for now.  3. Left lower extremity edema suspicious for DVT,  we will order venous  Duplex B/L.  4. COPD exacerbation - patient was actively wheezing at today's visit spill she is opposed to the idea of taking oral steroids for concern of elevated blood pressure, we will prescribe Advair diskus 250/50 2 puffs BID.    Lars Masson, MD, [facility] 07/25/2013, 2:26 PM

## 2013-07-26 ENCOUNTER — Ambulatory Visit (HOSPITAL_COMMUNITY): Payer: Medicare Other | Attending: Cardiology

## 2013-07-26 ENCOUNTER — Encounter: Payer: Self-pay | Admitting: Internal Medicine

## 2013-07-26 DIAGNOSIS — J449 Chronic obstructive pulmonary disease, unspecified: Secondary | ICD-10-CM | POA: Insufficient documentation

## 2013-07-26 DIAGNOSIS — M7989 Other specified soft tissue disorders: Secondary | ICD-10-CM

## 2013-07-26 DIAGNOSIS — R079 Chest pain, unspecified: Secondary | ICD-10-CM

## 2013-07-26 DIAGNOSIS — I10 Essential (primary) hypertension: Secondary | ICD-10-CM | POA: Insufficient documentation

## 2013-07-26 DIAGNOSIS — J4489 Other specified chronic obstructive pulmonary disease: Secondary | ICD-10-CM | POA: Insufficient documentation

## 2013-07-26 DIAGNOSIS — R0602 Shortness of breath: Secondary | ICD-10-CM | POA: Insufficient documentation

## 2013-07-26 DIAGNOSIS — Z87891 Personal history of nicotine dependence: Secondary | ICD-10-CM | POA: Insufficient documentation

## 2013-07-26 DIAGNOSIS — M79609 Pain in unspecified limb: Secondary | ICD-10-CM

## 2013-07-26 DIAGNOSIS — I82409 Acute embolism and thrombosis of unspecified deep veins of unspecified lower extremity: Secondary | ICD-10-CM

## 2013-08-01 ENCOUNTER — Encounter: Payer: Self-pay | Admitting: Cardiology

## 2013-08-01 ENCOUNTER — Ambulatory Visit (HOSPITAL_COMMUNITY): Payer: Medicare Other | Attending: Cardiology | Admitting: Radiology

## 2013-08-01 VITALS — BP 152/71 | HR 60 | Ht 67.5 in | Wt 160.0 lb

## 2013-08-01 DIAGNOSIS — R079 Chest pain, unspecified: Secondary | ICD-10-CM

## 2013-08-01 DIAGNOSIS — R0602 Shortness of breath: Secondary | ICD-10-CM

## 2013-08-01 MED ORDER — TECHNETIUM TC 99M SESTAMIBI GENERIC - CARDIOLITE
11.0000 | Freq: Once | INTRAVENOUS | Status: AC | PRN
  Administered 2013-08-01: 11 via INTRAVENOUS

## 2013-08-01 MED ORDER — TECHNETIUM TC 99M SESTAMIBI GENERIC - CARDIOLITE
33.0000 | Freq: Once | INTRAVENOUS | Status: AC | PRN
  Administered 2013-08-01: 33 via INTRAVENOUS

## 2013-08-01 NOTE — Progress Notes (Signed)
[facility] SITE 3 NUCLEAR MED [address] [place], [place] [address] [id number]    Cardiology Nuclear Med Study  Megan Glover is a 68 y.o. female     MRN : [id number]     DOB: November 08, 1945  Procedure Date: 08/01/2013  Nuclear Med Background Indication for Stress Test:  Evaluation for Ischemia and 07-2013  [facility] for Chest pain, SOB,R/O MI History: No prior known history of CAD, 13 yrs ago: Myocardial Perfusion Imaging-Normal per patient Cardiac Risk Factors: Family History - CAD, History of Smoking, Hypertension and Lipids  Symptoms: Chest Pain with/without exertion (last occurrence yesterday), Dizziness, DOE, Palpitations and SOB   Nuclear Pre-Procedure Caffeine/Decaff Intake:  None NPO After: 8:00pm   Lungs:  clear O2 Sat: 97% on room air. IV 0.9% NS with Angio Cath:  22g  IV Site: R Hand  IV Started by:  Bonnita Levan, RN  Chest Size (in):  40 Cup Size: B  Height: 5' 7.5" (1.715 m)  Weight:  160 lb (72.576 kg)  BMI:  Body mass index is 24.68 kg/(m^2). Tech Comments:  N/A    Nuclear Med Study 1 or 2 day study: 1 day  Stress Test Type:  Stress  Reading MD: [name], MD  Order Authorizing Provider:  Tobias Alexander, MD  Resting Radionuclide: Technetium 103m Sestamibi  Resting Radionuclide Dose: 11.0 mCi   Stress Radionuclide:  Technetium 74m Sestamibi  Stress Radionuclide Dose: 33.0 mCi           Stress Protocol Rest HR: 60 Stress HR: 144  Rest BP: 152/71 Stress BP: 220/69  Exercise Time (min): 5:32 METS: 7.0   Predicted Max HR: 152 bpm % Max HR: 94.74 bpm Rate Pressure Product: 21308   Dose of Adenosine (mg):  n/a Dose of Lexiscan: n/a mg  Dose of Atropine (mg): n/a Dose of Dobutamine: n/a mcg/kg/min (at max HR)  Stress Test Technologist: Irean Hong, RN  Nuclear Technologist:  Domenic Polite, CNMT     Rest Procedure:  Myocardial perfusion imaging was performed at rest 45 minutes following the intravenous administration of  Technetium 9m Sestamibi. Rest ECG: NSR, mildly peaked T waves.  Stress Procedure:  The patient exercised on the treadmill utilizing the Bruce Protocol for 5:32 minutes, RPE= 16. The patient stopped due to DOE and denied any chest pain. There was a marked hypertensive response to exercise with BP medications taken this am. Technetium 34m Sestamibi was injected at peak exercise and myocardial perfusion imaging was performed after a brief delay. Stress ECG: No significant ST segment change suggestive of ischemia.  QPS Raw Data Images:  Acquisition technically good; normal left ventricular size. Stress Images:  Normal homogeneous uptake in all areas of the myocardium. Rest Images:  Normal homogeneous uptake in all areas of the myocardium. Subtraction (SDS):  No evidence of ischemia. Transient Ischemic Dilatation (Normal <1.22):  0.98 Lung/Heart Ratio (Normal <0.45):  0.29  Quantitative Gated Spect Images QGS EDV:  68 ml QGS ESV:  19 ml  Impression Exercise Capacity:  Fair exercise capacity. BP Response:  Hypertensive blood pressure response. Clinical Symptoms:  There is dyspnea. ECG Impression:  No significant ST segment change suggestive of ischemia. Comparison with Prior Nuclear Study: No images to compare  Overall Impression:  Normal stress nuclear study.  LV Ejection Fraction: 73%.  LV Wall Motion:  NL LV Function; NL Wall Motion  [name]

## 2013-08-06 ENCOUNTER — Encounter: Payer: Self-pay | Admitting: *Deleted

## 2013-08-14 ENCOUNTER — Ambulatory Visit (HOSPITAL_COMMUNITY)
Admission: RE | Admit: 2013-08-14 | Discharge: 2013-08-14 | Disposition: A | Discharge status: Home / Self Care | Payer: Medicare Other | Source: Ambulatory Visit | Attending: Cardiology | Admitting: Cardiology

## 2013-08-14 ENCOUNTER — Encounter: Payer: Self-pay | Admitting: Cardiology

## 2013-08-14 ENCOUNTER — Ambulatory Visit (INDEPENDENT_AMBULATORY_CARE_PROVIDER_SITE_OTHER): Payer: Medicare Other | Admitting: Cardiology

## 2013-08-14 VITALS — BP 164/88 | HR 50 | Ht 67.5 in | Wt 161.0 lb

## 2013-08-14 DIAGNOSIS — R062 Wheezing: Secondary | ICD-10-CM | POA: Insufficient documentation

## 2013-08-14 DIAGNOSIS — J189 Pneumonia, unspecified organism: Secondary | ICD-10-CM

## 2013-08-14 DIAGNOSIS — R0989 Other specified symptoms and signs involving the circulatory and respiratory systems: Secondary | ICD-10-CM

## 2013-08-14 DIAGNOSIS — R079 Chest pain, unspecified: Secondary | ICD-10-CM | POA: Insufficient documentation

## 2013-08-14 DIAGNOSIS — J40 Bronchitis, not specified as acute or chronic: Secondary | ICD-10-CM | POA: Insufficient documentation

## 2013-08-14 MED ORDER — ISOSORBIDE MONONITRATE ER 30 MG PO TB24: 30.0000 mg | ORAL_TABLET | Freq: Every day | ORAL | Status: DC

## 2013-08-14 NOTE — Patient Instructions (Signed)
New Medication: Imdur 30mg  daily  Continue with your Albuterol as needed  A chest x-ray takes a picture of the organs and structures inside the chest, including the heart, lungs, and blood vessels. This test can show several things, including, whether the heart is enlarges; whether fluid is building up in the lungs; and whether pacemaker / defibrillator leads are still in place.   Your physician recommends that you schedule a follow-up appointment in: 1 month with Dr. Delton See

## 2013-08-14 NOTE — Progress Notes (Signed)
Patient ID: Megan Glover, female   DOB: 09-Jun-1946, 67 y.o.   MRN: [id number]     Patient Name: Megan Glover Date of Encounter: 08/14/2013  Primary Care Provider:  Lenora Boys, MD Primary Cardiologist:  Tobias Alexander, H   Problem List   Past Medical History  Diagnosis Date  . Hypertension   . Thyroid disease    No past surgical history on file.  Allergies  Allergies  Allergen Reactions  . Clindamycin/Lincomycin   . Demerol [Meperidine]   . Erythromycin   . Sulfa Antibiotics     HPI  This is a 67 year old female with poorly controlled HTN who presented to the ER on 07/12/13 with with shortness of breath and chest pain. The pain started at rest, it was under her left breast that radiated to her back. It was pleuritic and sharp. Patient also reports left lower extremity "charley horses" and left lower extremity pain. She denies any swelling or history of blood clots but does have family history of blood clots. CT chest in the ER was negative for pulmonary embolism. Patient has a history of hypertension and an early family history of heart disease. She is a former smoker.   Her pain resolved in the ER, she was found to be wheezing and started on Prednisone and Albuterol for COPD exacerbation. The patient complains of severely elevated BP and states that she stopped taking Prednisone as she felt it was elevating her BP.  She admits to DOE after mild exertion. She has episodic left sided sharp chest pains not related to exertion.   This is 2 week follow up. The patient feels that her SOB is improved but she is still wheezing.   Home Medications  Prior to Admission medications   Medication Sig Start Date End Date Taking? Authorizing Provider  albuterol (PROVENTIL HFA;VENTOLIN HFA) 108 (90 BASE) MCG/ACT inhaler Inhale 2 puffs into the lungs every 4 (four) hours as needed for wheezing or shortness of breath. 07/16/13  Yes Shon Baton, MD  busPIRone (BUSPAR) 15 MG  tablet Take 15 mg by mouth 3 (three) times daily.   Yes Historical Provider, MD  cyclobenzaprine (FLEXERIL) 10 MG tablet Take 10 mg by mouth 3 (three) times daily as needed for muscle spasms.   Yes Historical Provider, MD  hydrALAZINE (APRESOLINE) 10 MG tablet Take 10 mg by mouth 3 (three) times daily.   Yes Historical Provider, MD  levothyroxine (SYNTHROID, LEVOTHROID) 50 MCG tablet Take 50 mcg by mouth daily before breakfast.   Yes Historical Provider, MD  losartan (COZAAR) 100 MG tablet Take 100 mg by mouth daily.   Yes Historical Provider, MD  meloxicam (MOBIC) 15 MG tablet One tab PO qAM with breakfast for 2 weeks, then daily prn pain. 04/12/13  Yes Monica Becton, MD  predniSONE (DELTASONE) 20 MG tablet Take 3 tablets (60 mg total) by mouth daily. 07/16/13  Yes Shon Baton, MD    Family History  Family History  Problem Relation Age of Onset  . Cancer Mother     breast CA  . Diabetes Father   . Hypertension Father     Social History  History   Social History  . Marital Status: Married    Spouse Name: N/A    Number of Children: N/A  . Years of Education: N/A   Occupational History  . Not on file.   Social History Main Topics  . Smoking status: Never Smoker   . Smokeless tobacco: Never Used  .  Alcohol Use: No  . Drug Use: No  . Sexual Activity: Not on file   Other Topics Concern  . Not on file   Social History Narrative  . No narrative on file     Review of Systems, as per HPI, otherwise negative General:  No chills, fever, night sweats or weight changes.  Cardiovascular:  No chest pain, dyspnea on exertion, edema, orthopnea, palpitations, paroxysmal nocturnal dyspnea. Dermatological: No rash, lesions/masses Respiratory: No cough, dyspnea Urologic: No hematuria, dysuria Abdominal:   No nausea, vomiting, diarrhea, bright red blood per rectum, melena, or hematemesis Neurologic:  No visual changes, wkns, changes in mental status. All other systems  reviewed and are otherwise negative except as noted above.  Physical Exam  There were no vitals taken for this visit.  General: Pleasant, NAD Psych: Normal affect. Neuro: Alert and oriented X 3. Moves all extremities spontaneously. HEENT: Normal  Neck: Supple without bruits or JVD. Lungs:  Resp regular and unlabored, wheezing B/L Heart: RRR no s3, s4, or murmurs. Abdomen: Soft, non-tender, non-distended, BS + x 4.  Extremities: No clubbing, cyanosis LLE edema up to the knee, bruising. DP/PT/Radials 2+ and equal bilaterally.  Labs:  No results found for this basename: CKTOTAL, CKMB, TROPONINI,  in the last 72 hours Lab Results  Component Value Date   WBC 9.0 07/16/2013   HGB 13.9 07/16/2013   HCT 40.3 07/16/2013   MCV 90.4 07/16/2013   PLT 232 07/16/2013   No results found for this basename: NA, K, CL, CO2, BUN, CREATININE, CALCIUM, LABALBU, PROT, BILITOT, ALKPHOS, ALT, AST, GLUCOSE,  in the last 168 hours No results found for this basename: CHOL,  HDL,  LDLCALC,  TRIG   No results found for this basename: DDIMER   No components found with this basename: POCBNP,   Accessory Clinical Findings  Echocardiogram - none  ECG - sinus rhythm, 93 beats per minute, biatrial enlargement, 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  BP Response: Hypertensive blood pressure response.  Clinical Symptoms: There is dyspnea.  ECG Impression: No significant ST segment change suggestive of ischemia.  Comparison with Prior Nuclear Study: No images to compare  Overall Impression: Normal stress nuclear study.  LV Ejection Fraction: 73%. LV Wall Motion: NL LV Function; NL Wall Motion  [name]    Assessment & Plan  67 year old female  1. Atypical chest pain - risk factor including her smoking, poorly  controlled hypertension, hyperlipidemia significant family history of CAD. Exercise nuclear stress test showed poor exercise capacity but no ischemia and normal LVEF.   She had hypertensive response to exercise, we will focus on her BP control.  2. Hypertension - poorly controlled with hypertensive urgency the last month, patient states that she was intolerant to multiple medication in the past she believes that medication including carvedilol, amlodipine, and bisoprolol were causing her significant hair loss and she states that she would rather have a stroke with high blood pressure did be out of hair. She stopped taking hydralazine because of GI problems. Clonidine gave her fatigue and low BP. She was started on Coreg 6.25 PO BID. We will add Imdur 30 mg po daily and follow in 1 month.  3. Left lower extremity edema - no DVT on venous  Duplex B/L.  4. COPD exacerbation - started  on advair at the last visit, she still has residual wheezing, we will add albuterol x 5 days and order a CXR to rule out pneumonia.    Lars Masson, MD, [facility] 08/14/2013, 1:27 PM

## 2013-08-19 ENCOUNTER — Telehealth: Payer: Self-pay | Admitting: Cardiology

## 2013-08-19 NOTE — Telephone Encounter (Signed)
Pt given her CXR results, she verbalized understanding. The pt states that she cannot use the Advailr inhaler because

## 2013-08-19 NOTE — Telephone Encounter (Signed)
New Prob    Pt returning call regarding chest X-Ray results. Please call.

## 2013-08-23 NOTE — Telephone Encounter (Signed)
Spoke with Megan Glover, per my chart the Megan Glover has been having headaches since starting the isosorbide. Megan Glover instructed to take 1/2 tablet to see if she can tolerate. Also the advair is causing her to have ulcers in her mouth, Megan Glover instructed to stop the advair, she will use saline gargle to help her mouth. She has an albuterol inhaler to use if she needs it. She also wants to know if she can get an antibiotic,amoxicillin only, for bronchitis. Aware dr Delton Seenelson is not here today will forward for her review. Megan Glover agreed with this plan.

## 2013-08-26 NOTE — Telephone Encounter (Signed)
Hi Megan Glover, Thank you for managing this. I agree with taking half of isosorbide and not using Advair.  However if she wants to be treated with antibiotics, she needs to see her PCP or a doctor at the urgent care. At the last visit I didn't feel she needed antibiotics. Thank you, Aris LotKatarina

## 2013-08-27 NOTE — Telephone Encounter (Signed)
Spoke with pt, she has had to stop the isosorbide because the headaches did not change with decreasing the dosage. She has a follow up appt with her primary care tomorrow and will discuss antibiotics at that time. Will make dr Delton Seenelson aware she stopped the isosorbide.

## 2013-08-29 ENCOUNTER — Encounter: Payer: Self-pay | Admitting: *Deleted

## 2013-09-12 ENCOUNTER — Emergency Department (HOSPITAL_COMMUNITY): Payer: Medicare Other

## 2013-09-12 ENCOUNTER — Encounter (HOSPITAL_COMMUNITY): Payer: Self-pay | Admitting: Emergency Medicine

## 2013-09-12 ENCOUNTER — Ambulatory Visit (INDEPENDENT_AMBULATORY_CARE_PROVIDER_SITE_OTHER): Payer: Medicare Other | Admitting: Cardiology

## 2013-09-12 ENCOUNTER — Observation Stay (HOSPITAL_COMMUNITY)
Admission: EM | Admit: 2013-09-12 | Discharge: 2013-09-13 | Disposition: A | Discharge status: Home / Self Care | Payer: Medicare Other | Attending: Internal Medicine | Admitting: Internal Medicine

## 2013-09-12 ENCOUNTER — Encounter: Payer: Self-pay | Admitting: Cardiology

## 2013-09-12 VITALS — BP 218/110 | HR 94 | Ht 67.5 in | Wt 162.0 lb

## 2013-09-12 DIAGNOSIS — M7711 Lateral epicondylitis, right elbow: Secondary | ICD-10-CM

## 2013-09-12 DIAGNOSIS — R52 Pain, unspecified: Secondary | ICD-10-CM

## 2013-09-12 DIAGNOSIS — I10 Essential (primary) hypertension: Secondary | ICD-10-CM

## 2013-09-12 DIAGNOSIS — I160 Hypertensive urgency: Secondary | ICD-10-CM | POA: Insufficient documentation

## 2013-09-12 DIAGNOSIS — K581 Irritable bowel syndrome with constipation: Secondary | ICD-10-CM | POA: Diagnosis present

## 2013-09-12 DIAGNOSIS — M797 Fibromyalgia: Secondary | ICD-10-CM | POA: Diagnosis present

## 2013-09-12 DIAGNOSIS — R109 Unspecified abdominal pain: Secondary | ICD-10-CM

## 2013-09-12 DIAGNOSIS — R1013 Epigastric pain: Principal | ICD-10-CM

## 2013-09-12 DIAGNOSIS — R1011 Right upper quadrant pain: Secondary | ICD-10-CM | POA: Insufficient documentation

## 2013-09-12 DIAGNOSIS — E039 Hypothyroidism, unspecified: Secondary | ICD-10-CM | POA: Insufficient documentation

## 2013-09-12 DIAGNOSIS — F411 Generalized anxiety disorder: Secondary | ICD-10-CM | POA: Insufficient documentation

## 2013-09-12 DIAGNOSIS — IMO0001 Reserved for inherently not codable concepts without codable children: Secondary | ICD-10-CM

## 2013-09-12 DIAGNOSIS — K589 Irritable bowel syndrome without diarrhea: Secondary | ICD-10-CM

## 2013-09-12 DIAGNOSIS — K5730 Diverticulosis of large intestine without perforation or abscess without bleeding: Secondary | ICD-10-CM | POA: Insufficient documentation

## 2013-09-12 LAB — URINALYSIS, ROUTINE W REFLEX MICROSCOPIC
Bilirubin Urine: NEGATIVE
Glucose, UA: NEGATIVE mg/dL
Hgb urine dipstick: NEGATIVE
Ketones, ur: 15 mg/dL — AB
Leukocytes, UA: NEGATIVE
Nitrite: NEGATIVE
Protein, ur: NEGATIVE mg/dL
Specific Gravity, Urine: 1.006 (ref 1.005–1.030)
Urobilinogen, UA: 0.2 mg/dL (ref 0.0–1.0)
pH: 6 (ref 5.0–8.0)

## 2013-09-12 LAB — LACTIC ACID, PLASMA: Lactic Acid, Venous: 1 mmol/L (ref 0.5–2.2)

## 2013-09-12 LAB — CBC WITH DIFFERENTIAL/PLATELET
Basophils Absolute: 0 10*3/uL (ref 0.0–0.1)
Basophils Relative: 0 % (ref 0–1)
Eosinophils Absolute: 0.1 10*3/uL (ref 0.0–0.7)
Eosinophils Relative: 1 % (ref 0–5)
HCT: 39.8 % (ref 36.0–46.0)
Hemoglobin: 14.3 g/dL (ref 12.0–15.0)
Lymphocytes Relative: 18 % (ref 12–46)
Lymphs Abs: 1.6 10*3/uL (ref 0.7–4.0)
MCH: 31.9 pg (ref 26.0–34.0)
MCHC: 35.9 g/dL (ref 30.0–36.0)
MCV: 88.8 fL (ref 78.0–100.0)
Monocytes Absolute: 0.7 10*3/uL (ref 0.1–1.0)
Monocytes Relative: 8 % (ref 3–12)
Neutro Abs: 6.5 10*3/uL (ref 1.7–7.7)
Neutrophils Relative %: 74 % (ref 43–77)
Platelets: 197 10*3/uL (ref 150–400)
RBC: 4.48 MIL/uL (ref 3.87–5.11)
RDW: 12.5 % (ref 11.5–15.5)
WBC: 8.8 10*3/uL (ref 4.0–10.5)

## 2013-09-12 LAB — COMPREHENSIVE METABOLIC PANEL
ALT: 21 U/L (ref 0–35)
AST: 29 U/L (ref 0–37)
Albumin: 4.1 g/dL (ref 3.5–5.2)
Alkaline Phosphatase: 66 U/L (ref 39–117)
BUN: 13 mg/dL (ref 6–23)
CO2: 21 mEq/L (ref 19–32)
Calcium: 9.6 mg/dL (ref 8.4–10.5)
Chloride: 101 mEq/L (ref 96–112)
Creatinine, Ser: 0.68 mg/dL (ref 0.50–1.10)
GFR calc Af Amer: >90 mL/min (ref >90)
GFR calc non Af Amer: 88 mL/min — ABNORMAL LOW (ref >90)
Glucose, Bld: 101 mg/dL — ABNORMAL HIGH (ref 70–99)
Potassium: 4.1 mEq/L (ref 3.7–5.3)
Sodium: 139 mEq/L (ref 137–147)
Total Bilirubin: 0.5 mg/dL (ref 0.3–1.2)
Total Protein: 7.6 g/dL (ref 6.0–8.3)

## 2013-09-12 LAB — TROPONIN I: Troponin I: <0.3 ng/mL (ref <0.30)

## 2013-09-12 LAB — LIPASE, BLOOD: Lipase: 40 U/L (ref 11–59)

## 2013-09-12 MED ORDER — ONDANSETRON HCL 4 MG/2ML IJ SOLN
4.0000 mg | Freq: Once | INTRAMUSCULAR | Status: AC
  Administered 2013-09-12: 4 mg via INTRAVENOUS
  Filled 2013-09-12: qty 2

## 2013-09-12 MED ORDER — MORPHINE SULFATE 4 MG/ML IJ SOLN
4.0000 mg | Freq: Once | INTRAMUSCULAR | Status: AC
  Administered 2013-09-12: 4 mg via INTRAVENOUS
  Filled 2013-09-12: qty 1

## 2013-09-12 MED ORDER — IOHEXOL 300 MG/ML  SOLN
100.0000 mL | Freq: Once | INTRAMUSCULAR | Status: AC | PRN
  Administered 2013-09-12: 100 mL via INTRAVENOUS

## 2013-09-12 MED ORDER — IOHEXOL 300 MG/ML  SOLN
25.0000 mL | INTRAMUSCULAR | Status: AC
  Administered 2013-09-12: 25 mL via ORAL

## 2013-09-12 MED ORDER — GI COCKTAIL ~~LOC~~
30.0000 mL | Freq: Once | ORAL | Status: AC
  Administered 2013-09-12: 30 mL via ORAL
  Filled 2013-09-12: qty 30

## 2013-09-12 MED ORDER — FAMOTIDINE IN NACL 20-0.9 MG/50ML-% IV SOLN
20.0000 mg | Freq: Once | INTRAVENOUS | Status: AC
  Administered 2013-09-12: 20 mg via INTRAVENOUS
  Filled 2013-09-12: qty 50

## 2013-09-12 NOTE — ED Notes (Signed)
Pt transproted to [place]

## 2013-09-12 NOTE — ED Notes (Signed)
CT notified of finished contrast.  

## 2013-09-12 NOTE — Progress Notes (Signed)
Patient ID: Megan FloroKathleen Kaestner, female   DOB: 1945-09-09, 68 y.o.   MRN: [id number]     Patient Name: Megan Glover Date of Encounter: 09/12/2013  Primary Care Provider:  Lenora BoysFRIED, ROBERT L, MD Primary Cardiologist:  Tobias AlexanderNELSON, Marcella Charlson, H   Problem List   Past Medical History  Diagnosis Date  . Hypertension   . Thyroid disease   . History of diverticulosis   . Fibromyalgia   . Hypothyroidism   . IBS (irritable bowel syndrome)    Past Surgical History  Procedure Laterality Date  . Dilation and curettage of uterus      Allergies  Allergies  Allergen Reactions  . Clindamycin/Lincomycin   . Demerol [Meperidine]   . Erythromycin   . Sulfa Antibiotics     HPI  This is a 68 year old female with poorly controlled HTN who presented to the ER on 07/12/13 with with shortness of breath and chest pain. The pain started at rest, it was under her left breast that radiated to her back. It was pleuritic and sharp. Patient also reports left lower extremity "charley horses" and left lower extremity pain. She denies any swelling or history of blood clots but does have family history of blood clots. CT chest in the ER was negative for pulmonary embolism. Patient has a history of hypertension and an early family history of heart disease. She is a former smoker. She was diagnosed with COPD exacerbation and treated.   She has been coming to my clinic with poorly controlled hypertension. The patient is intoleratant to multiple medications including betablockers (hair loss), hydralazine (abdominal pain, constipation), nitrates (headaches), colchicine (headaches, hypotension).  She has experienced severe hypertension in the last week, with measurements 190-210. Her PCO increased carvedilol to 12.5 po BID, losartan 100 mg po daily and hydralazine to 10 mg po TID (she didn't tolerate 25 mg).  She recently underwent a stress test, where she showed poor exercise capacity and hypertensive response to exercise  and no ischemia.  She is coming today complaining of fatigue, epigastric and abdominal pain, worsening after food intake. She complains of constipation.    Home Medications  Prior to Admission medications   Medication Sig Start Date End Date Taking? Authorizing Provider  albuterol (PROVENTIL HFA;VENTOLIN HFA) 108 (90 BASE) MCG/ACT inhaler Inhale 2 puffs into the lungs every 4 (four) hours as needed for wheezing or shortness of breath. 07/16/13  Yes Shon Batonourtney F Horton, MD  busPIRone (BUSPAR) 15 MG tablet Take 15 mg by mouth 3 (three) times daily.   Yes Historical Provider, MD  cyclobenzaprine (FLEXERIL) 10 MG tablet Take 10 mg by mouth 3 (three) times daily as needed for muscle spasms.   Yes Historical Provider, MD  hydrALAZINE (APRESOLINE) 10 MG tablet Take 10 mg by mouth 3 (three) times daily.   Yes Historical Provider, MD  levothyroxine (SYNTHROID, LEVOTHROID) 50 MCG tablet Take 50 mcg by mouth daily before breakfast.   Yes Historical Provider, MD  losartan (COZAAR) 100 MG tablet Take 100 mg by mouth daily.   Yes Historical Provider, MD  meloxicam (MOBIC) 15 MG tablet One tab PO qAM with breakfast for 2 weeks, then daily prn pain. 04/12/13  Yes Monica Bectonhomas J Thekkekandam, MD  predniSONE (DELTASONE) 20 MG tablet Take 3 tablets (60 mg total) by mouth daily. 07/16/13  Yes Shon Batonourtney F Horton, MD    Family History  Family History  Problem Relation Age of Onset  . Breast cancer Mother     breast CA  . Diabetes Father   .  Hypertension Father   . Colon cancer      Social History  History   Social History  . Marital Status: Married    Spouse Name: N/A    Number of Children: N/A  . Years of Education: N/A   Occupational History  . Not on file.   Social History Main Topics  . Smoking status: Never Smoker   . Smokeless tobacco: Never Used  . Alcohol Use: No  . Drug Use: No  . Sexual Activity: Not on file   Other Topics Concern  . Not on file   Social History Narrative  . No  narrative on file     Review of Systems, as per HPI, otherwise negative General:  No chills, fever, night sweats or weight changes.  Cardiovascular:  No chest pain, dyspnea on exertion, edema, orthopnea, palpitations, paroxysmal nocturnal dyspnea. Dermatological: No rash, lesions/masses Respiratory: No cough, dyspnea Urologic: No hematuria, dysuria Abdominal:   No nausea, vomiting, diarrhea, bright red blood per rectum, melena, or hematemesis Neurologic:  No visual changes, wkns, changes in mental status. All other systems reviewed and are otherwise negative except as noted above.  Physical Exam  Blood pressure 218/110, pulse 94, height 5' 7.5" (1.715 m), weight 162 lb (73.483 kg).  General: Pleasant, NAD Psych: Normal affect. Neuro: Alert and oriented X 3. Moves all extremities spontaneously. HEENT: Normal  Neck: Supple without bruits or JVD. Lungs:  Resp regular and unlabored, wheezing B/L Heart: RRR no s3, s4, or murmurs. Abdomen: Soft, non-tender, non-distended, BS + x 4.  Extremities: No clubbing, cyanosis LLE edema up to the knee, bruising. DP/PT/Radials 2+ and equal bilaterally.  Labs:  No results found for this basename: CKTOTAL, CKMB, TROPONINI,  in the last 72 hours Lab Results  Component Value Date   WBC 9.0 07/16/2013   HGB 13.9 07/16/2013   HCT 40.3 07/16/2013   MCV 90.4 07/16/2013   PLT 232 07/16/2013    Accessory Clinical Findings  Echocardiogram - none  ECG - sinus rhythm, 93 beats per minute, biatrial enlargement, 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  BP Response: Hypertensive blood pressure response.  Clinical Symptoms: There is dyspnea.  ECG Impression: No significant ST segment change suggestive of ischemia.  Comparison with Prior Nuclear Study: No images to  compare  Overall Impression: Normal stress nuclear study.  LV Ejection Fraction: 73%. LV Wall Motion: NL LV Function; NL Wall Motion  [name]    Assessment & Plan  68 year old female  1. Hypertensive urgency - patient is intolerant to multiple medications (see above), today's BP 218/110 despite taking losartan and carvedilol this morning, the patient is complaining of abdominal pain what might represent anginal equivalent.  We will send her to the ER for further work up.  2. Abdominal pain - differential is broad and includes anginal equivalent, but is reproducible and might represent cholecystitis, ischemic colitis.  She will require CT abdomen and angiography of abdominal vessels.  Total time spent with patient care was 45 minutes.    Tobias Alexander, Rexene Edison, MD, [facility] 09/12/2013, 2:25 PM

## 2013-09-12 NOTE — ED Notes (Signed)
Pt transported to xray 

## 2013-09-12 NOTE — ED Notes (Signed)
Pt transported to CT ?

## 2013-09-12 NOTE — H&P (Signed)
PCP:   Lenora Boys, MD   Chief Complaint:  abd pain  HPI: 68 yo female h/o labile htn, fibromyalgia, ibs, diverticulosis comes to ED sent by her cardiologist Dr. Delton See for w/u of epigastric abd pain with associated nausea no vomiting.  Denies fevers.  No le edema or swelling.  No cp.  No sob.  Pain has been relieved earlier in ED with gi cocktail and morphine, she is not sure which one really helped.  Her ct scan of abd/pelvis done shows no acute process and labs are all normal.  Pt htn was uncontrolled on arrival but resolved to almost normal with treatment of her abd pain.  It was deemed pt to be discharged home, however pt has refused to go home, thus [facility] was called for admission.  Pt reports she is upset because she claims Dr. Delton See told her she would be a full admission for several days, and not observed.  Pt specifically does not want obs status due to cost of observation status vs admit status.  Pt had normal myocardial perfusion study last month.  Review of Systems:  Positive and negative as per HPI otherwise all other systems are negative  Past Medical History: Past Medical History  Diagnosis Date  . Hypertension   . Thyroid disease   . History of diverticulosis   . Fibromyalgia   . Hypothyroidism   . IBS (irritable bowel syndrome)    Past Surgical History  Procedure Laterality Date  . Dilation and curettage of uterus      Medications: Prior to Admission medications   Medication Sig Start Date End Date Taking? Authorizing Provider  acetaminophen (TYLENOL) 500 MG tablet Take 1,000 mg by mouth daily as needed for moderate pain.   Yes Historical Provider, MD  albuterol (PROVENTIL HFA;VENTOLIN HFA) 108 (90 BASE) MCG/ACT inhaler Inhale 2 puffs into the lungs every 8 (eight) hours as needed for wheezing or shortness of breath.   Yes Historical Provider, MD  ALPRAZolam (XANAX) 0.25 MG tablet Take 0.25-0.75 mg by mouth See admin instructions. Patient can take 1-3 tablets  by mouth at bedtime as needed for sleep.   Yes Historical Provider, MD  B Complex-C (B-COMPLEX WITH VITAMIN C) tablet Take 1 tablet by mouth daily.   Yes Historical Provider, MD  Biotin (PA BIOTIN) 1000 MCG tablet Take 1,000 mcg by mouth daily.   Yes Historical Provider, MD  busPIRone (BUSPAR) 15 MG tablet Take 15 mg by mouth 3 (three) times daily.   Yes Historical Provider, MD  carvedilol (COREG) 12.5 MG tablet Take 12.5 mg by mouth 2 (two) times daily with a meal.   Yes Historical Provider, MD  cholecalciferol (VITAMIN D) 1000 UNITS tablet Take 2,000 Units by mouth daily.   Yes Historical Provider, MD  cyclobenzaprine (FLEXERIL) 10 MG tablet Take 10 mg by mouth daily as needed for muscle spasms.    Yes Historical Provider, MD  hydrALAZINE (APRESOLINE) 10 MG tablet Take 10 mg by mouth 3 (three) times daily.   Yes Historical Provider, MD  levothyroxine (SYNTHROID, LEVOTHROID) 50 MCG tablet Take 50 mcg by mouth daily before breakfast.   Yes Historical Provider, MD  losartan (COZAAR) 100 MG tablet Take 100 mg by mouth daily.   Yes Historical Provider, MD  magnesium oxide (MAG-OX) 400 MG tablet Take 400 mg by mouth daily.   Yes Historical Provider, MD  Multiple Vitamins-Minerals (ZINC PO) Take 1 capsule by mouth daily.   Yes Historical Provider, MD  OVER THE COUNTER  MEDICATION Apply 2 drops to eye at bedtime as needed (for itchy dry eyes.).   Yes Historical Provider, MD  vitamin E 400 UNIT capsule Take 400 Units by mouth daily.   Yes Historical Provider, MD    Allergies:   Allergies  Allergen Reactions  . Clindamycin/Lincomycin     CDIF  . Demerol [Meperidine] Nausea And Vomiting, Other (See Comments) and Hypertension    Severe headache  . Erythromycin Diarrhea  . Sulfa Antibiotics Other (See Comments)    Anal itching    Social History:  reports that she has never smoked. She has never used smokeless tobacco. She reports that she does not drink alcohol or use illicit drugs.  Family  History: Family History  Problem Relation Age of Onset  . Breast cancer Mother     breast CA  . Diabetes Father   . Hypertension Father   . Colon cancer      Physical Exam: Filed Vitals:   09/12/13 1745 09/12/13 2022 09/12/13 2102 09/12/13 2215  BP: 173/81 167/75 159/70 160/86  Pulse: 96 85 83 95  Temp:      TempSrc:      Resp:  14 18 20   SpO2: 99% 99% 97% 95%   General appearance: alert, cooperative and no distress Head: Normocephalic, without obvious abnormality, atraumatic Eyes: negative Nose: Nares normal. Septum midline. Mucosa normal. No drainage or sinus tenderness. Neck: no JVD and supple, symmetrical, trachea midline Lungs: clear to auscultation bilaterally Heart: regular rate and rhythm, S1, S2 normal, no murmur, click, rub or gallop Abdomen: soft, non-tender; bowel sounds normal; no masses,  no organomegaly  nonacute abd Extremities: extremities normal, atraumatic, no cyanosis or edema Pulses: 2+ and symmetric Skin: Skin color, texture, turgor normal. No rashes or lesions Neurologic: Grossly normal    Labs on Admission:   Recent Labs  09/12/13 1647  NA 139  K 4.1  CL 101  CO2 21  GLUCOSE 101*  BUN 13  CREATININE 0.68  CALCIUM 9.6    Recent Labs  09/12/13 1647  AST 29  ALT 21  ALKPHOS 66  BILITOT 0.5  PROT 7.6  ALBUMIN 4.1    Recent Labs  09/12/13 1647  WBC 8.8  NEUTROABS 6.5  HGB 14.3  HCT 39.8  MCV 88.8  PLT 197    Radiological Exams on Admission: Dg Chest 2 View  09/12/2013   CLINICAL DATA:  Hypertension, abdominal pain  EXAM: CHEST  2 VIEW  COMPARISON:  DG CHEST 2 VIEW dated 08/14/2013  FINDINGS: The lungs are hyperinflated likely secondary to COPD. There is no focal parenchymal opacity, pleural effusion, or pneumothorax. The heart and mediastinal contours are unremarkable.  The osseous structures are unremarkable.  IMPRESSION: No active cardiopulmonary disease.   Electronically Signed   By: Elige KoHetal  Patel   On: 09/12/2013 16:14    Ct Abdomen Pelvis W Contrast  09/12/2013   CLINICAL DATA:  Mid upper abdominal pain, right-sided abdominal pain, nausea and constipation, history of IBS, diverticulitis  EXAM: CT ABDOMEN AND PELVIS WITH CONTRAST  TECHNIQUE: Multidetector CT imaging of the abdomen and pelvis was performed using the standard protocol following bolus administration of intravenous contrast.  CONTRAST:  100mL OMNIPAQUE IOHEXOL 300 MG/ML  SOLN  COMPARISON:  US ABDOMEN LIMITED dated 09/12/2013; CT ABD/PELVIS W CM dated 02/21/2010; CT ABD/PELV WO CM dated 10/15/2009  FINDINGS: Normal hepatic contour. A minimal amount of focal fatty infiltration is noted adjacent to the fissure for ligamentum teres. Normal appearance of the gallbladder.  No intra or extrahepatic biliary duct dilatation. No ascites.  There is symmetric enhancement and excretion of the bilateral kidneys. No definite renal stones on this postcontrast examination. No renal lesions. Incidental note is again made of a right-sided extrarenal pelvis. No urinary obstruction or perinephric stranding. Normal appearance of the urinary bladder given degree of distention. Normal appearance of the bilateral adrenal glands, pancreas and spleen.  Extensive colonic diverticulosis without definite evidence of diverticulitis. Normal appearance of the appendix. No pneumoperitoneum, pneumatosis or portal venous gas.  Rather extensive amount of primarily calcified atherosclerotic plaque with a normal caliber abdominal aorta. The major branch vessels of the abdominal aorta appear patent on this non CTA examination, though there is a suspected hemodynamically significant narrowing involving the origin of the right common iliac artery, incompletely evaluated.  No retroperitoneal, mesenteric, pelvic or inguinal lymphadenopathy.  Normal appearance of the pelvic organs for age. No discrete adnexal lesion. No free fluid within the pelvis.  Limited visualization of the lower thorax demonstrates minimal  grossly symmetric subpleural ground-glass atelectasis. No focal airspace opacities. No pleural effusion. Normal heart size. No pericardial effusion.  No acute or aggressive osseous abnormalities. Schmorl's nodes are demonstrated within the endplates of multiple thoracic and vertebral bodies. Moderate bilateral facet degenerative change of the lower lumbar spine. A bone island is incidentally noted within the proximal left femoral diaphysis. Right-sided mesenteric fat containing inguinal hernia.  IMPRESSION: 1. No definite explanation for patient's upper/right-sided abdominal pain. Specifically, no evidence of enteric or urinary obstruction. Normal appearance of the appendix. 2. Extensive colonic diverticulosis without definite evidence of diverticulitis. 3. Atherosclerotic plaque within a normal caliber abdominal aorta. 4. Suspected hemodynamically significant narrowing involving the origin of the right common iliac artery, incompletely evaluated on this non CTA examination.   Electronically Signed   By: Simonne Come M.D.   On: 09/12/2013 20:38   US Abdomen Limited  09/12/2013   CLINICAL DATA:  Right upper quadrant pain  EXAM: US ABDOMEN LIMITED - RIGHT UPPER QUADRANT  COMPARISON:  None.  FINDINGS: Gallbladder:  No gallstones or wall thickening visualized. No sonographic Murphy sign noted.  Common bile duct:  Diameter: 5 mm  Liver:  No focal lesion identified. Within normal limits in parenchymal echogenicity.  IMPRESSION: No acute abnormality noted.   Electronically Signed   By: Alcide Clever M.D.   On: 09/12/2013 18:44    Assessment/Plan  68 yo female with abd pain of unclear etiology  Principal Problem:   Abdominal pain, acute, epigastric- per dr Delton See note today concerns were for anginal equivalent or ischemic bowel.  Initial w/u revealed nml lfts, normal abd [place] and neg ct scan of abd pelvis.  Pain was improved with gi cocktail and morphine, with improvement of her bp with tx of her pain.  A troponin  was ordered later in ED visit along with a lipase which are also normal.  i spoke with the radiologist dr Miles Costain who does not think cta of abd/pelvis is needed as her vessels are well viewed on study already done and are patent with no evidence of ischemia anywhere, lactic acid level is normal.  ekg no acute changes.  Will obs pt overnight to finish serial cardiac enzymes, have called dr Sherlie Ban also on call tonight for cardiology who also does not think this is anginal equivalent, he will relay info to dr Delton See in am to see patient.  Active Problems:   Hypertensive urgency   Fibromyalgia   IBS (irritable bowel syndrome)  Jacqulyne Gladue A 09/12/2013, 11:06 PM

## 2013-09-12 NOTE — ED Notes (Signed)
Dr. Kohut at bedside 

## 2013-09-12 NOTE — ED Provider Notes (Signed)
CSN: [id number]     Arrival date & time 09/12/13  1459 History   First MD Initiated Contact with Patient 09/12/13 1514     Chief Complaint  Patient presents with  . Hypertension  . Abdominal Pain   (Consider location/radiation/quality/duration/timing/severity/associated sxs/prior Treatment) HPI  68 year old female presenting with abdominal pain and hypertension. She was seen by her cardiologist shortly before arrival. Routine visit. Referred to emergency room for further evaluation of her abdominal pain. Pain is in the right upper quadrant and epigastrium. Gradual onset. Initially Just hurts. Denies postprandial pain, but does endorse early satiety. No urinary complaints. No fevers or chills. No nausea or vomiting. No shortness of breath. She has her gallbladder. No past history kidney stones. Very strong family history of CAD. Recent admit for CP. Had stress test where she showed poor exercise capacity and hypertensive response to exercise and no ischemia.   Past Medical History  Diagnosis Date  . Hypertension   . Thyroid disease   . History of diverticulosis   . Fibromyalgia   . Hypothyroidism   . IBS (irritable bowel syndrome)    Past Surgical History  Procedure Laterality Date  . Dilation and curettage of uterus     Family History  Problem Relation Age of Onset  . Breast cancer Mother     breast CA  . Diabetes Father   . Hypertension Father   . Colon cancer     History  Substance Use Topics  . Smoking status: Never Smoker   . Smokeless tobacco: Never Used  . Alcohol Use: No   OB History   Grav Para Term Preterm Abortions TAB SAB Ect Mult Living                 Review of Systems  All systems reviewed and negative, other than as noted in HPI.   Allergies  Clindamycin/lincomycin; Demerol; Erythromycin; and Sulfa antibiotics  Home Medications   Current Outpatient Rx  Name  Route  Sig  Dispense  Refill  . acetaminophen (TYLENOL) 500 MG tablet   Oral   Take  1,000 mg by mouth daily as needed for moderate pain.         Marland Kitchen. albuterol (PROVENTIL HFA;VENTOLIN HFA) 108 (90 BASE) MCG/ACT inhaler   Inhalation   Inhale 2 puffs into the lungs every 8 (eight) hours as needed for wheezing or shortness of breath.         . ALPRAZolam (XANAX) 0.25 MG tablet   Oral   Take 0.25-0.75 mg by mouth See admin instructions. Patient can take 1-3 tablets by mouth at bedtime as needed for sleep.         . B Complex-C (B-COMPLEX WITH VITAMIN C) tablet   Oral   Take 1 tablet by mouth daily.         . Biotin (PA BIOTIN) 1000 MCG tablet   Oral   Take 1,000 mcg by mouth daily.         . busPIRone (BUSPAR) 15 MG tablet   Oral   Take 15 mg by mouth 3 (three) times daily.         . carvedilol (COREG) 12.5 MG tablet   Oral   Take 12.5 mg by mouth 2 (two) times daily with a meal.         . cholecalciferol (VITAMIN D) 1000 UNITS tablet   Oral   Take 2,000 Units by mouth daily.         . cyclobenzaprine (FLEXERIL) 10  MG tablet   Oral   Take 10 mg by mouth daily as needed for muscle spasms.          . hydrALAZINE (APRESOLINE) 10 MG tablet   Oral   Take 10 mg by mouth 3 (three) times daily.         Marland Kitchen levothyroxine (SYNTHROID, LEVOTHROID) 50 MCG tablet   Oral   Take 50 mcg by mouth daily before breakfast.         . losartan (COZAAR) 100 MG tablet   Oral   Take 100 mg by mouth daily.         . magnesium oxide (MAG-OX) 400 MG tablet   Oral   Take 400 mg by mouth daily.         . Multiple Vitamins-Minerals (ZINC PO)   Oral   Take 1 capsule by mouth daily.         Marland Kitchen OVER THE COUNTER MEDICATION   Ophthalmic   Apply 2 drops to eye at bedtime as needed (for itchy dry eyes.).         Marland Kitchen vitamin E 400 UNIT capsule   Oral   Take 400 Units by mouth daily.          BP 219/95  Pulse 98  Temp(Src) 97.6 F (36.4 C) (Oral)  Resp 22  SpO2 98% Physical Exam  Nursing note and vitals reviewed. Constitutional: She appears  well-developed and well-nourished. No distress.  HENT:  Head: Normocephalic and atraumatic.  Eyes: Conjunctivae are normal. Right eye exhibits no discharge. Left eye exhibits no discharge.  Neck: Neck supple.  Cardiovascular: Normal rate, regular rhythm and normal heart sounds.  Exam reveals no gallop and no friction rub.   No murmur heard. Pulmonary/Chest: Effort normal and breath sounds normal. No respiratory distress. She exhibits tenderness.  Tenderness to palpation right lateral in the mid to lower thoracic region. No concerning skin lesions noted.  Abdominal: Soft. She exhibits no distension and no mass. There is tenderness. There is no rebound and no guarding.  Tenderness to palpation in right upper quadrant and epigastrium without rebound or guarding. No distention.  Musculoskeletal: She exhibits no edema and no tenderness.  Neurological: She is alert.  Skin: Skin is warm and dry.  Psychiatric: She has a normal mood and affect. Her behavior is normal. Thought content normal.    ED Course  Procedures (including critical care time) Labs Review Labs Reviewed  COMPREHENSIVE METABOLIC PANEL - Abnormal; Notable for the following:    Glucose, Bld 101 (*)    GFR calc non Af Amer 88 (*)    All other components within normal limits  URINALYSIS, ROUTINE W REFLEX MICROSCOPIC - Abnormal; Notable for the following:    Ketones, ur 15 (*)    All other components within normal limits  CBC WITH DIFFERENTIAL  TROPONIN I  LIPASE, BLOOD  LACTIC ACID, PLASMA   Imaging Review Dg Chest 2 View  09/12/2013   CLINICAL DATA:  Hypertension, abdominal pain  EXAM: CHEST  2 VIEW  COMPARISON:  DG CHEST 2 VIEW dated 08/14/2013  FINDINGS: The lungs are hyperinflated likely secondary to COPD. There is no focal parenchymal opacity, pleural effusion, or pneumothorax. The heart and mediastinal contours are unremarkable.  The osseous structures are unremarkable.  IMPRESSION: No active cardiopulmonary disease.    Electronically Signed   By: Elige Ko   On: 09/12/2013 16:14   Ct Abdomen Pelvis W Contrast  09/12/2013   CLINICAL DATA:  Mid  upper abdominal pain, right-sided abdominal pain, nausea and constipation, history of IBS, diverticulitis  EXAM: CT ABDOMEN AND PELVIS WITH CONTRAST  TECHNIQUE: Multidetector CT imaging of the abdomen and pelvis was performed using the standard protocol following bolus administration of intravenous contrast.  CONTRAST:  OMNIPAQUE IOHEXOL 300 MG/ML  SOLN  COMPARISON:  US ABDOMEN LIMITED dated 09/12/2013; CT ABD/PELVIS W CM dated 02/21/2010; CT ABD/PELV WO CM dated 10/15/2009  FINDINGS: Normal hepatic contour. A minimal amount of focal fatty infiltration is noted adjacent to the fissure for ligamentum teres. Normal appearance of the gallbladder. No intra or extrahepatic biliary duct dilatation. No ascites.  There is symmetric enhancement and excretion of the bilateral kidneys. No definite renal stones on this postcontrast examination. No renal lesions. Incidental note is again made of a right-sided extrarenal pelvis. No urinary obstruction or perinephric stranding. Normal appearance of the urinary bladder given degree of distention. Normal appearance of the bilateral adrenal glands, pancreas and spleen.  Extensive colonic diverticulosis without definite evidence of diverticulitis. Normal appearance of the appendix. No pneumoperitoneum, pneumatosis or portal venous gas.  Rather extensive amount of primarily calcified atherosclerotic plaque with a normal caliber abdominal aorta. The major branch vessels of the abdominal aorta appear patent on this non CTA examination, though there is a suspected hemodynamically significant narrowing involving the origin of the right common iliac artery, incompletely evaluated.  No retroperitoneal, mesenteric, pelvic or inguinal lymphadenopathy.  Normal appearance of the pelvic organs for age. No discrete adnexal lesion. No free fluid within the pelvis.   Limited visualization of the lower thorax demonstrates minimal grossly symmetric subpleural ground-glass atelectasis. No focal airspace opacities. No pleural effusion. Normal heart size. No pericardial effusion.  No acute or aggressive osseous abnormalities. Schmorl's nodes are demonstrated within the endplates of multiple thoracic and vertebral bodies. Moderate bilateral facet degenerative change of the lower lumbar spine. A bone island is incidentally noted within the proximal left femoral diaphysis. Right-sided mesenteric fat containing inguinal hernia.  IMPRESSION: 1. No definite explanation for patient's upper/right-sided abdominal pain. Specifically, no evidence of enteric or urinary obstruction. Normal appearance of the appendix. 2. Extensive colonic diverticulosis without definite evidence of diverticulitis. 3. Atherosclerotic plaque within a normal caliber abdominal aorta. 4. Suspected hemodynamically significant narrowing involving the origin of the right common iliac artery, incompletely evaluated on this non CTA examination.   Electronically Signed   By: Simonne Come M.D.   On: 09/12/2013 20:38   US Abdomen Limited  09/12/2013   CLINICAL DATA:  Right upper quadrant pain  EXAM: US ABDOMEN LIMITED - RIGHT UPPER QUADRANT  COMPARISON:  None.  FINDINGS: Gallbladder:  No gallstones or wall thickening visualized. No sonographic Murphy sign noted.  Common bile duct:  Diameter: 5 mm  Liver:  No focal lesion identified. Within normal limits in parenchymal echogenicity.  IMPRESSION: No acute abnormality noted.   Electronically Signed   By: Alcide Clever M.D.   On: 09/12/2013 18:44    EKG Interpretation    Date/Time:  Thursday September 12 2013 15:08:39 EST Ventricular Rate:  91 PR Interval:  196 QRS Duration: 74 QT Interval:  356 QTC Calculation: 437 R Axis:   74 Text Interpretation:  Normal sinus rhythm Biatrial enlargement Confirmed by Juleen China  MD, Lanard Arguijo ([id number]) on 09/12/2013 10:49:36 PM             MDM   1. Abdominal pain   2. Fibromyalgia   3. IBS (irritable bowel syndrome)   4. Hypertension  67yF with poorly controlled hypertension and abdominal pain. Long standing hx of HTN and intolerance to several classes of medicines. Improved in ED without vasoactive agents. RUQ/epigastric pain. [place] normal. CT with evidence of diffuse atherosclerotic disease, but not angiographic study. Could potentially be etiology of her pain. I do not have an obvious alternative explanation at this time. Tenderness is pretty well localized though. Mentioned postprandial pain to another provider but not giving me a clear history of this.    Pt any family understandably frustrated with difficulty with consistent BP control. Under the impression that she was to get cardiology evaluation again in ED and be admitted. I do not think her BP is acutely related to to her abdominal pain although likely contributing to underlying atherosclerosis. Discussed with pt and family acute versus chronic BP management and indications for emergent reduction.  Discussed that cardiology likely would not provide further management of her abdominal pain or necessarily guide recommendation for future studies.Discussed with on call cardiology who gratefully agreeable to see her.  This may be abdominal angina, but I do not think it is an anginal equivalent. Just had stress testing with no ischemic changes. Pretty unremarkable EKG today. Very atypical symptoms in character and very reprodudible. Troponin ordered at hospitalist request.    Raeford Razor, MD 09/13/13 [phone]

## 2013-09-12 NOTE — ED Notes (Signed)
Attempted to start IV x2, will attempt with another RN

## 2013-09-12 NOTE — ED Notes (Signed)
Pt states she feels SOB, HA, crazy spikes in BP, coinciding with pain in abdomen.

## 2013-09-12 NOTE — ED Notes (Signed)
Pt reports she has had spikes in BP this week, states 3 times. Sent by Cardiologist for further evaluation. Pt also states she has abdominal pain. Denies blurred vision or headache. BP >200 systolic.

## 2013-09-13 ENCOUNTER — Encounter (HOSPITAL_COMMUNITY): Payer: Self-pay | Admitting: Gastroenterology

## 2013-09-13 DIAGNOSIS — R109 Unspecified abdominal pain: Secondary | ICD-10-CM

## 2013-09-13 LAB — TROPONIN I
Troponin I: <0.3 ng/mL (ref <0.30)
Troponin I: <0.3 ng/mL (ref <0.30)
Troponin I: <0.3 ng/mL (ref <0.30)

## 2013-09-13 MED ORDER — ACETAMINOPHEN 500 MG PO TABS
1000.0000 mg | ORAL_TABLET | Freq: Every day | ORAL | Status: DC | PRN
  Administered 2013-09-13: 1000 mg via ORAL
  Filled 2013-09-13: qty 2

## 2013-09-13 MED ORDER — ENOXAPARIN SODIUM 40 MG/0.4ML ~~LOC~~ SOLN
40.0000 mg | SUBCUTANEOUS | Status: DC
  Administered 2013-09-13: 40 mg via SUBCUTANEOUS
  Filled 2013-09-13: qty 0.4

## 2013-09-13 MED ORDER — VITAMIN D3 25 MCG (1000 UNIT) PO TABS
2000.0000 [IU] | ORAL_TABLET | Freq: Every day | ORAL | Status: DC
  Administered 2013-09-13: 2000 [IU] via ORAL
  Filled 2013-09-13: qty 2

## 2013-09-13 MED ORDER — ALBUTEROL SULFATE (2.5 MG/3ML) 0.083% IN NEBU: 3.0000 mL | INHALATION_SOLUTION | Freq: Three times a day (TID) | RESPIRATORY_TRACT | Status: DC | PRN

## 2013-09-13 MED ORDER — ONDANSETRON HCL 4 MG PO TABS: 4.0000 mg | ORAL_TABLET | Freq: Four times a day (QID) | ORAL | Status: DC | PRN

## 2013-09-13 MED ORDER — SODIUM CHLORIDE 0.9 % IV SOLN: 250.0000 mL | INTRAVENOUS | Status: DC | PRN

## 2013-09-13 MED ORDER — SODIUM CHLORIDE 0.9 % IJ SOLN
3.0000 mL | Freq: Two times a day (BID) | INTRAMUSCULAR | Status: DC
  Administered 2013-09-13 (×2): 3 mL via INTRAVENOUS

## 2013-09-13 MED ORDER — HYDRALAZINE HCL 10 MG PO TABS
10.0000 mg | ORAL_TABLET | Freq: Three times a day (TID) | ORAL | Status: DC
  Filled 2013-09-13 (×3): qty 1

## 2013-09-13 MED ORDER — SODIUM CHLORIDE 0.9 % IJ SOLN: 3.0000 mL | INTRAMUSCULAR | Status: DC | PRN

## 2013-09-13 MED ORDER — ONDANSETRON HCL 4 MG/2ML IJ SOLN: 4.0000 mg | Freq: Four times a day (QID) | INTRAMUSCULAR | Status: DC | PRN

## 2013-09-13 MED ORDER — HYDRALAZINE HCL 10 MG PO TABS
10.0000 mg | ORAL_TABLET | Freq: Three times a day (TID) | ORAL | Status: DC
  Administered 2013-09-13: 10 mg via ORAL
  Filled 2013-09-13 (×2): qty 1

## 2013-09-13 MED ORDER — ALUM & MAG HYDROXIDE-SIMETH 200-200-20 MG/5ML PO SUSP: 30.0000 mL | Freq: Four times a day (QID) | ORAL | Status: DC | PRN

## 2013-09-13 MED ORDER — MORPHINE SULFATE 4 MG/ML IJ SOLN
4.0000 mg | Freq: Once | INTRAMUSCULAR | Status: AC
  Administered 2013-09-13: 4 mg via INTRAVENOUS
  Filled 2013-09-13: qty 1

## 2013-09-13 MED ORDER — HYDRALAZINE HCL 50 MG PO TABS
50.0000 mg | ORAL_TABLET | Freq: Three times a day (TID) | ORAL | Status: DC
  Filled 2013-09-13 (×3): qty 1

## 2013-09-13 MED ORDER — LEVOTHYROXINE SODIUM 50 MCG PO TABS
50.0000 ug | ORAL_TABLET | Freq: Every day | ORAL | Status: DC
  Administered 2013-09-13: 50 ug via ORAL
  Filled 2013-09-13 (×2): qty 1

## 2013-09-13 MED ORDER — CARVEDILOL 12.5 MG PO TABS
12.5000 mg | ORAL_TABLET | Freq: Two times a day (BID) | ORAL | Status: DC
  Administered 2013-09-13: 12.5 mg via ORAL
  Filled 2013-09-13 (×4): qty 1

## 2013-09-13 MED ORDER — B COMPLEX-C PO TABS
1.0000 | ORAL_TABLET | Freq: Every day | ORAL | Status: DC
  Administered 2013-09-13: 1 via ORAL
  Filled 2013-09-13: qty 1

## 2013-09-13 MED ORDER — BUSPIRONE HCL 15 MG PO TABS
15.0000 mg | ORAL_TABLET | Freq: Three times a day (TID) | ORAL | Status: DC
Start: 2013-09-13 — End: 2013-09-13
  Administered 2013-09-13: 15 mg via ORAL
  Filled 2013-09-13 (×3): qty 1

## 2013-09-13 MED ORDER — CYCLOBENZAPRINE HCL 10 MG PO TABS: 10.0000 mg | ORAL_TABLET | Freq: Every day | ORAL | Status: DC | PRN

## 2013-09-13 MED ORDER — ALPRAZOLAM 0.25 MG PO TABS: 0.2500 mg | ORAL_TABLET | Freq: Every evening | ORAL | Status: DC | PRN

## 2013-09-13 MED ORDER — SODIUM CHLORIDE 0.9 % IJ SOLN: 3.0000 mL | Freq: Two times a day (BID) | INTRAMUSCULAR | Status: DC

## 2013-09-13 MED ORDER — LOSARTAN POTASSIUM 50 MG PO TABS
50.0000 mg | ORAL_TABLET | Freq: Every day | ORAL | Status: DC
  Administered 2013-09-13: 50 mg via ORAL
  Filled 2013-09-13: qty 1

## 2013-09-13 MED ORDER — VITAMIN E 180 MG (400 UNIT) PO CAPS
400.0000 [IU] | ORAL_CAPSULE | Freq: Every day | ORAL | Status: DC
  Administered 2013-09-13: 400 [IU] via ORAL
  Filled 2013-09-13: qty 1

## 2013-09-13 MED ORDER — LORAZEPAM 2 MG/ML IJ SOLN
1.0000 mg | Freq: Once | INTRAMUSCULAR | Status: AC
  Administered 2013-09-13: 1 mg via INTRAVENOUS
  Filled 2013-09-13: qty 1

## 2013-09-13 NOTE — Progress Notes (Signed)
UR completed 

## 2013-09-13 NOTE — Progress Notes (Signed)
Patient refusal of hydralazine 50mg  tablet noted and made Dr. Thedore MinsSingh aware of this.  Pt reports she will agree to take hydralazine 10mg  TID as ordered at home.  Received orders from Dr. Thedore MinsSingh to modify order for hydralazine.  Aliece Honold, Di KindleHRISTOPHER S RN, BSN

## 2013-09-13 NOTE — Consult Note (Signed)
Reason for Consult: Abdominal pain Referring Physician: Hospital team  Megan Glover is an 68 y.o. female.  HPI: Patient with a long history of GI problems followed in [place] with 2007 colonoscopy and endoscopy and a followup colonoscopy in 2011 with a small polyp requiring five-year follow who has IBS and her mother has IBS but no other GI problems run in the family and no gallbladder problems run in the family and her pain is overall better just mild right upper quadrant discomfort but it is not related to eating or moving her bowels and currently she is constipated but does not want MiraLax and uses a home concoction which works most of the time and her office chart and hospital computer chart were reviewed and her case were discussed with the patient and her husband and her labs were normal in her CT was okay without signs of ischemia per radiology and she has no other specific GI complaint  Past Medical History  Diagnosis Date  . Hypertension   . Thyroid disease   . History of diverticulosis   . Fibromyalgia   . Hypothyroidism   . IBS (irritable bowel syndrome)     Past Surgical History  Procedure Laterality Date  . Dilation and curettage of uterus      Family History  Problem Relation Age of Onset  . Breast cancer Mother     breast CA  . Diabetes Father   . Hypertension Father   . Colon cancer      Social History:  reports that she has never smoked. She has never used smokeless tobacco. She reports that she does not drink alcohol or use illicit drugs.  Allergies:  Allergies  Allergen Reactions  . Clindamycin/Lincomycin     CDIF  . Demerol [Meperidine] Nausea And Vomiting, Other (See Comments) and Hypertension    Severe headache  . Erythromycin Diarrhea  . Sulfa Antibiotics Other (See Comments)    Anal itching    Medications: I have reviewed the patient's current medications.  Results for orders placed during the hospital encounter of 09/12/13 (from the past  48 hour(s))  CBC WITH DIFFERENTIAL     Status: None   Collection Time    09/12/13  4:47 PM      Result Value Range   WBC 8.8  4.0 - 10.5 K/uL   RBC 4.48  3.87 - 5.11 MIL/uL   Hemoglobin 14.3  12.0 - 15.0 g/dL   HCT 39.8  36.0 - 46.0 %   MCV 88.8  78.0 - 100.0 fL   MCH 31.9  26.0 - 34.0 pg   MCHC 35.9  30.0 - 36.0 g/dL   RDW 12.5  11.5 - 15.5 %   Platelets 197  150 - 400 K/uL   Neutrophils Relative % 74  43 - 77 %   Neutro Abs 6.5  1.7 - 7.7 K/uL   Lymphocytes Relative 18  12 - 46 %   Lymphs Abs 1.6  0.7 - 4.0 K/uL   Monocytes Relative 8  3 - 12 %   Monocytes Absolute 0.7  0.1 - 1.0 K/uL   Eosinophils Relative 1  0 - 5 %   Eosinophils Absolute 0.1  0.0 - 0.7 K/uL   Basophils Relative 0  0 - 1 %   Basophils Absolute 0.0  0.0 - 0.1 K/uL  COMPREHENSIVE METABOLIC PANEL     Status: Abnormal   Collection Time    09/12/13  4:47 PM  Result Value Range   Sodium 139  137 - 147 mEq/L   Potassium 4.1  3.7 - 5.3 mEq/L   Chloride 101  96 - 112 mEq/L   CO2 21  19 - 32 mEq/L   Glucose, Bld 101 (*) 70 - 99 mg/dL   BUN 13  6 - 23 mg/dL   Creatinine, Ser 0.68  0.50 - 1.10 mg/dL   Calcium 9.6  8.4 - 10.5 mg/dL   Total Protein 7.6  6.0 - 8.3 g/dL   Albumin 4.1  3.5 - 5.2 g/dL   AST 29  0 - 37 U/L   ALT 21  0 - 35 U/L   Alkaline Phosphatase 66  39 - 117 U/L   Total Bilirubin 0.5  0.3 - 1.2 mg/dL   GFR calc non Af Amer 88 (*) >90 mL/min   GFR calc Af Amer >90  >90 mL/min   Comment: (NOTE)     The eGFR has been calculated using the CKD EPI equation.     This calculation has not been validated in all clinical situations.     eGFR's persistently <90 mL/min signify possible Chronic Kidney     Disease.  URINALYSIS, ROUTINE W REFLEX MICROSCOPIC     Status: Abnormal   Collection Time    09/12/13  5:08 PM      Result Value Range   Color, Urine YELLOW  YELLOW   APPearance CLEAR  CLEAR   Specific Gravity, Urine 1.006  1.005 - 1.030   pH 6.0  5.0 - 8.0   Glucose, UA NEGATIVE  NEGATIVE mg/dL    Hgb urine dipstick NEGATIVE  NEGATIVE   Bilirubin Urine NEGATIVE  NEGATIVE   Ketones, ur 15 (*) NEGATIVE mg/dL   Protein, ur NEGATIVE  NEGATIVE mg/dL   Urobilinogen, UA 0.2  0.0 - 1.0 mg/dL   Nitrite NEGATIVE  NEGATIVE   Leukocytes, UA NEGATIVE  NEGATIVE   Comment: MICROSCOPIC NOT DONE ON URINES WITH NEGATIVE PROTEIN, BLOOD, LEUKOCYTES, NITRITE, OR GLUCOSE <1000 mg/dL.  LACTIC ACID, PLASMA     Status: None   Collection Time    09/12/13 10:28 PM      Result Value Range   Lactic Acid, Venous 1.0  0.5 - 2.2 mmol/L  TROPONIN I     Status: None   Collection Time    09/12/13 10:45 PM      Result Value Range   Troponin I <0.30  <0.30 ng/mL   Comment:            Due to the release kinetics of cTnI,     a negative result within the first hours     of the onset of symptoms does not rule out     myocardial infarction with certainty.     If myocardial infarction is still suspected,     repeat the test at appropriate intervals.  LIPASE, BLOOD     Status: None   Collection Time    09/12/13 11:00 PM      Result Value Range   Lipase 40  11 - 59 U/L  TROPONIN I     Status: None   Collection Time    09/13/13  4:02 AM      Result Value Range   Troponin I <0.30  <0.30 ng/mL   Comment:            Due to the release kinetics of cTnI,     a negative result within the first hours  of the onset of symptoms does not rule out     myocardial infarction with certainty.     If myocardial infarction is still suspected,     repeat the test at appropriate intervals.  TROPONIN I     Status: None   Collection Time    09/13/13  7:45 AM      Result Value Range   Troponin I <0.30  <0.30 ng/mL   Comment:            Due to the release kinetics of cTnI,     a negative result within the first hours     of the onset of symptoms does not rule out     myocardial infarction with certainty.     If myocardial infarction is still suspected,     repeat the test at appropriate intervals.  TROPONIN I      Status: None   Collection Time    09/13/13  1:40 PM      Result Value Range   Troponin I <0.30  <0.30 ng/mL   Comment:            Due to the release kinetics of cTnI,     a negative result within the first hours     of the onset of symptoms does not rule out     myocardial infarction with certainty.     If myocardial infarction is still suspected,     repeat the test at appropriate intervals.    Dg Chest 2 View  09/12/2013   CLINICAL DATA:  Hypertension, abdominal pain  EXAM: CHEST  2 VIEW  COMPARISON:  DG CHEST 2 VIEW dated 08/14/2013  FINDINGS: The lungs are hyperinflated likely secondary to COPD. There is no focal parenchymal opacity, pleural effusion, or pneumothorax. The heart and mediastinal contours are unremarkable.  The osseous structures are unremarkable.  IMPRESSION: No active cardiopulmonary disease.   Electronically Signed   By: Kathreen Devoid   On: 09/12/2013 16:14   Ct Abdomen Pelvis W Contrast  09/12/2013   ADDENDUM REPORT: 09/12/2013 23:55  ADDENDUM: CT exam and the reconstructions were re- evaluated to assess for mesenteric vessel patency. Aorta is calcified however the celiac, SMA, and IMA appear patent. No evidence of significant mesenteric proximal vascular occlusive process. Superior mesenteric vein also patent.  Negative for bowel obstruction, dilatation, ileus, free air, or pneumatosis. No area of bowel wall thickening.  No CT signs to suggest bowel ischemia.  Findings discussed with Dr. Meda Coffee.   Electronically Signed   By: Daryll Brod M.D.   On: 09/12/2013 23:55   09/12/2013   CLINICAL DATA:  Mid upper abdominal pain, right-sided abdominal pain, nausea and constipation, history of IBS, diverticulitis  EXAM: CT ABDOMEN AND PELVIS WITH CONTRAST  TECHNIQUE: Multidetector CT imaging of the abdomen and pelvis was performed using the standard protocol following bolus administration of intravenous contrast.  CONTRAST:  16m OMNIPAQUE IOHEXOL 300 MG/ML  SOLN  COMPARISON:  [place] ABDOMEN LIMITED dated 09/12/2013; CT ABD/PELVIS W CM dated 02/21/2010; CT ABD/PELV WO CM dated 10/15/2009  FINDINGS: Normal hepatic contour. A minimal amount of focal fatty infiltration is noted adjacent to the fissure for ligamentum teres. Normal appearance of the gallbladder. No intra or extrahepatic biliary duct dilatation. No ascites.  There is symmetric enhancement and excretion of the bilateral kidneys. No definite renal stones on this postcontrast examination. No renal lesions. Incidental note is again made of a right-sided extrarenal pelvis. No urinary obstruction or perinephric  stranding. Normal appearance of the urinary bladder given degree of distention. Normal appearance of the bilateral adrenal glands, pancreas and spleen.  Extensive colonic diverticulosis without definite evidence of diverticulitis. Normal appearance of the appendix. No pneumoperitoneum, pneumatosis or portal venous gas.  Rather extensive amount of primarily calcified atherosclerotic plaque with a normal caliber abdominal aorta. The major branch vessels of the abdominal aorta appear patent on this non CTA examination, though there is a suspected hemodynamically significant narrowing involving the origin of the right common iliac artery, incompletely evaluated.  No retroperitoneal, mesenteric, pelvic or inguinal lymphadenopathy.  Normal appearance of the pelvic organs for age. No discrete adnexal lesion. No free fluid within the pelvis.  Limited visualization of the lower thorax demonstrates minimal grossly symmetric subpleural ground-glass atelectasis. No focal airspace opacities. No pleural effusion. Normal heart size. No pericardial effusion.  No acute or aggressive osseous abnormalities. Schmorl's nodes are demonstrated within the endplates of multiple thoracic and vertebral bodies. Moderate bilateral facet degenerative change of the lower lumbar spine. A bone island is incidentally noted within the proximal left femoral diaphysis.  Right-sided mesenteric fat containing inguinal hernia.  IMPRESSION: 1. No definite explanation for patient's upper/right-sided abdominal pain. Specifically, no evidence of enteric or urinary obstruction. Normal appearance of the appendix. 2. Extensive colonic diverticulosis without definite evidence of diverticulitis. 3. Atherosclerotic plaque within a normal caliber abdominal aorta. 4. Suspected hemodynamically significant narrowing involving the origin of the right common iliac artery, incompletely evaluated on this non CTA examination.  Electronically Signed: By: Sandi Mariscal M.D. On: 09/12/2013 20:38   US Abdomen Limited  09/12/2013   CLINICAL DATA:  Right upper quadrant pain  EXAM: US ABDOMEN LIMITED - RIGHT UPPER QUADRANT  COMPARISON:  None.  FINDINGS: Gallbladder:  No gallstones or wall thickening visualized. No sonographic Murphy sign noted.  Common bile duct:  Diameter: 5 mm  Liver:  No focal lesion identified. Within normal limits in parenchymal echogenicity.  IMPRESSION: No acute abnormality noted.   Electronically Signed   By: Inez Catalina M.D.   On: 09/12/2013 18:44    ROS negative except above Blood pressure 136/60, pulse 76, temperature 98.4 F (36.9 C), temperature source Oral, resp. rate 18, height 5' 7.5" (1.715 m), weight 72.1 kg (158 lb 15.2 oz), SpO2 96.00%. Physical Exam vital signs stable afebrile no acute distress abdomen is soft nontender cannot reduplicate the pain good peripheral pulses no pedal edema labs normal CT okay  Assessment/Plan: Multiple medical problems including chronic IBS with improved right upper quadrant pain and constipation Plan: Okay with me to do outpatient workup if needed possibly a CCK PIPIDA scan next and either she can follow up with me in the office in a few weeks or return to her [place] [facility] and please call us back if we could be of any further assistance with this hospital stay  [facility] 09/13/2013, 5:06 PM

## 2013-09-13 NOTE — Care Management Note (Unsigned)
    Page 1 of 1   09/13/2013     4:34:31 PM   CARE MANAGEMENT NOTE 09/13/2013  Patient:  Megan Glover, Megan Glover   Account Number:  [id number]  Date Initiated:  09/13/2013  Documentation initiated by:  Erika Slaby  Subjective/Objective Assessment:   PT ADM ON 1/29 WITH ABD PAIN, HTN.  PTA, PT INDEPENDENT, LIVES WITH SPOUSE.     Action/Plan:   WILL FOLLOW FOR DC NEEDS AS PT PROGRESSES.   Anticipated DC Date:  09/14/2013   Anticipated DC Plan:  [place]  CM consult      Choice offered to / List presented to:             Status of service:  In process, will continue to follow Medicare Important Message given?   (If response is "NO", the following Medicare IM given date fields will be blank) Date Medicare IM given:   Date Additional Medicare IM given:    Discharge Disposition:    Per UR Regulation:  Reviewed for med. necessity/level of care/duration of stay  If discussed at [place] of Stay Meetings, dates discussed:    Comments:  09/12/13 Megan Fangman,RN,BSN [phone] MET WITH PT AND SPOUSE TO DISCUSS INPATIENT VS [place].  THEY ARE CONCERNED THAT HOSP BILL WILL NOT BE PAID FOR BECAUSE PT IS ADMITTED AS OBSERVATION.  EXPLAINED THAT OBSERVATION IS A DIFFERENT WAY OF BILLING, IT DOES NOT MEAN THAT INSURANCE WILL NOT PAY.  ANSWERED ALL OF THEIR QUESTIONS; THEY SEEM MUCH LESS ANXIOUS AFTER MY VISIT, AND THANKED ME FOR MY HELP.

## 2013-09-13 NOTE — Discharge Summary (Signed)
Megan Glover, is a 68 y.o. female  DOB 02-Oct-1945  MRN [id number].  Admission date:  09/12/2013  Admitting Physician  Haydee Monica, MD  Discharge Date:  09/13/2013   Primary MD  Lenora Boys, MD  Recommendations for primary care physician for things to follow:   Follow clinically   Admission Diagnosis  Hypertension [401.9] IBS (irritable bowel syndrome) [564.1] Fibromyalgia [729.1] Hypertensive urgency [401.9] Abdominal pain, acute, epigastric [789.06, 338.19] Abdominal pain [789.00]  Discharge Diagnosis   IBS, poorly controlled BP  Principal Problem:   Abdominal pain, acute, epigastric Active Problems:   Hypertensive urgency   Fibromyalgia   IBS (irritable bowel syndrome)      Past Medical History  Diagnosis Date  . Hypertension   . Thyroid disease   . History of diverticulosis   . Fibromyalgia   . Hypothyroidism   . IBS (irritable bowel syndrome)     Past Surgical History  Procedure Laterality Date  . Dilation and curettage of uterus       Discharge Condition: stable    Follow UP  Follow-up Information   Follow up with FRIED, ROBERT L, MD. Schedule an appointment as soon as possible for a visit in 1 week.   Specialty:  Family Medicine   Contact information:   [address] [place] [address] [phone]       Follow up with [facility], MD. Schedule an appointment as soon as possible for a visit in 1 week.   Specialty:  Gastroenterology   Contact information:   [address], [address] [place] [address] [phone]         Consults obtained - GI   Discharge Medications      Medication List         acetaminophen 500 MG tablet  Commonly known as:  TYLENOL  Take 1,000 mg by mouth daily as needed for moderate pain.     albuterol 108 (90 BASE) MCG/ACT inhaler    Commonly known as:  PROVENTIL HFA;VENTOLIN HFA  Inhale 2 puffs into the lungs every 8 (eight) hours as needed for wheezing or shortness of breath.     B-complex with vitamin C tablet  Take 1 tablet by mouth daily.     busPIRone 15 MG tablet  Commonly known as:  BUSPAR  Take 15 mg by mouth 3 (three) times daily.     carvedilol 12.5 MG tablet  Commonly known as:  COREG  Take 12.5 mg by mouth 2 (two) times daily with a meal.     cholecalciferol 1000 UNITS tablet  Commonly known as:  VITAMIN D  Take 2,000 Units by mouth daily.     cyclobenzaprine 10 MG tablet  Commonly known as:  FLEXERIL  Take 10 mg by mouth daily as needed for muscle spasms.     hydrALAZINE 10 MG tablet  Commonly known as:  APRESOLINE  Take 10 mg by mouth 3 (three) times daily.     levothyroxine 50 MCG tablet  Commonly  known as:  SYNTHROID, LEVOTHROID  Take 50 mcg by mouth daily before breakfast.     losartan 100 MG tablet  Commonly known as:  COZAAR  Take 100 mg by mouth daily.     magnesium oxide 400 MG tablet  Commonly known as:  MAG-OX  Take 400 mg by mouth daily.     OVER THE COUNTER MEDICATION  Apply 2 drops to eye at bedtime as needed (for itchy dry eyes.).     PA BIOTIN 1000 MCG tablet  Generic drug:  Biotin  Take 1,000 mcg by mouth daily.     vitamin E 400 UNIT capsule  Take 400 Units by mouth daily.     XANAX 0.25 MG tablet  Generic drug:  ALPRAZolam  Take 0.25-0.75 mg by mouth See admin instructions. Patient can take 1-3 tablets by mouth at bedtime as needed for sleep.     ZINC PO  Take 1 capsule by mouth daily.         Diet and Activity recommendation: See Discharge Instructions below   Discharge Instructions     Follow with Primary MD FRIED, ROBERT L, MD in 7 days   Get CBC, CMP, checked 7 days by Primary MD and again as instructed by your Primary MD.    Activity: As tolerated with Full fall precautions use walker/cane & assistance as needed   Disposition Home      Diet: Heart Healthy    For Heart failure patients - Check your Weight same time everyday, if you gain over 2 pounds, or you develop in leg swelling, experience more shortness of breath or chest pain, call your Primary MD immediately. Follow Cardiac Low Salt Diet and 1.8 lit/day fluid restriction.   On your next visit with her primary care physician please Get Medicines reviewed and adjusted.  Please request your Prim.MD to go over all Hospital Tests and Procedure/Radiological results at the follow up, please get all Hospital records sent to your Prim MD by signing hospital release before you go home.   If you experience worsening of your admission symptoms, develop shortness of breath, life threatening emergency, suicidal or homicidal thoughts you must seek medical attention immediately by calling 911 or calling your MD immediately  if symptoms less severe.  You Must read complete instructions/literature along with all the possible adverse reactions/side effects for all the Medicines you take and that have been prescribed to you. Take any new Medicines after you have completely understood and accpet all the possible adverse reactions/side effects.   Do not drive and provide baby sitting services if your were admitted for syncope or siezures until you have seen by Primary MD or a Neurologist and advised to do so again.  Do not drive when taking Pain medications.    Do not take more than prescribed Pain, Sleep and Anxiety Medications  Special Instructions: If you have smoked or chewed Tobacco  in the last 2 yrs please stop smoking, stop any regular Alcohol  and or any Recreational drug use.  Wear Seat belts while driving.   Please note  You were cared for by a hospitalist during your hospital stay. If you have any questions about your discharge medications or the care you received while you were in the hospital after you are discharged, you can call the unit and asked to speak with  the hospitalist on call if the hospitalist that took care of you is not available. Once you are discharged, your primary care physician will handle  any further medical issues. Please note that NO REFILLS for any discharge medications will be authorized once you are discharged, as it is imperative that you return to your primary care physician (or establish a relationship with a primary care physician if you do not have one) for your aftercare needs so that they can reassess your need for medications and monitor your lab values.    Major procedures and Radiology Reports - PLEASE review detailed and final reports for all details, in brief -       Dg Chest 2 View  09/12/2013   CLINICAL DATA:  Hypertension, abdominal pain  EXAM: CHEST  2 VIEW  COMPARISON:  DG CHEST 2 VIEW dated 08/14/2013  FINDINGS: The lungs are hyperinflated likely secondary to COPD. There is no focal parenchymal opacity, pleural effusion, or pneumothorax. The heart and mediastinal contours are unremarkable.  The osseous structures are unremarkable.  IMPRESSION: No active cardiopulmonary disease.   Electronically Signed   By: Elige Ko   On: 09/12/2013 16:14   Ct Abdomen Pelvis W Contrast  09/12/2013   ADDENDUM REPORT: 09/12/2013 23:55  ADDENDUM: CT exam and the reconstructions were re- evaluated to assess for mesenteric vessel patency. Aorta is calcified however the celiac, SMA, and IMA appear patent. No evidence of significant mesenteric proximal vascular occlusive process. Superior mesenteric vein also patent.  Negative for bowel obstruction, dilatation, ileus, free air, or pneumatosis. No area of bowel wall thickening.  No CT signs to suggest bowel ischemia.  Findings discussed with Dr. Delton See.   Electronically Signed   By: Ruel Favors M.D.   On: 09/12/2013 23:55   09/12/2013   CLINICAL DATA:  Mid upper abdominal pain, right-sided abdominal pain, nausea and constipation, history of IBS, diverticulitis  EXAM: CT ABDOMEN AND  PELVIS WITH CONTRAST  TECHNIQUE: Multidetector CT imaging of the abdomen and pelvis was performed using the standard protocol following bolus administration of intravenous contrast.  CONTRAST:  OMNIPAQUE IOHEXOL 300 MG/ML  SOLN  COMPARISON:  US ABDOMEN LIMITED dated 09/12/2013; CT ABD/PELVIS W CM dated 02/21/2010; CT ABD/PELV WO CM dated 10/15/2009  FINDINGS: Normal hepatic contour. A minimal amount of focal fatty infiltration is noted adjacent to the fissure for ligamentum teres. Normal appearance of the gallbladder. No intra or extrahepatic biliary duct dilatation. No ascites.  There is symmetric enhancement and excretion of the bilateral kidneys. No definite renal stones on this postcontrast examination. No renal lesions. Incidental note is again made of a right-sided extrarenal pelvis. No urinary obstruction or perinephric stranding. Normal appearance of the urinary bladder given degree of distention. Normal appearance of the bilateral adrenal glands, pancreas and spleen.  Extensive colonic diverticulosis without definite evidence of diverticulitis. Normal appearance of the appendix. No pneumoperitoneum, pneumatosis or portal venous gas.  Rather extensive amount of primarily calcified atherosclerotic plaque with a normal caliber abdominal aorta. The major branch vessels of the abdominal aorta appear patent on this non CTA examination, though there is a suspected hemodynamically significant narrowing involving the origin of the right common iliac artery, incompletely evaluated.  No retroperitoneal, mesenteric, pelvic or inguinal lymphadenopathy.  Normal appearance of the pelvic organs for age. No discrete adnexal lesion. No free fluid within the pelvis.  Limited visualization of the lower thorax demonstrates minimal grossly symmetric subpleural ground-glass atelectasis. No focal airspace opacities. No pleural effusion. Normal heart size. No pericardial effusion.  No acute or aggressive osseous abnormalities.  Schmorl's nodes are demonstrated within the endplates of multiple thoracic and vertebral bodies. Moderate  bilateral facet degenerative change of the lower lumbar spine. A bone island is incidentally noted within the proximal left femoral diaphysis. Right-sided mesenteric fat containing inguinal hernia.  IMPRESSION: 1. No definite explanation for patient's upper/right-sided abdominal pain. Specifically, no evidence of enteric or urinary obstruction. Normal appearance of the appendix. 2. Extensive colonic diverticulosis without definite evidence of diverticulitis. 3. Atherosclerotic plaque within a normal caliber abdominal aorta. 4. Suspected hemodynamically significant narrowing involving the origin of the right common iliac artery, incompletely evaluated on this non CTA examination.  Electronically Signed: By: Simonne ComeJohn  Watts M.D. On: 09/12/2013 20:38   [place] Abdomen Limited  09/12/2013   CLINICAL DATA:  Right upper quadrant pain  EXAM: US ABDOMEN LIMITED - RIGHT UPPER QUADRANT  COMPARISON:  None.  FINDINGS: Gallbladder:  No gallstones or wall thickening visualized. No sonographic Murphy sign noted.  Common bile duct:  Diameter: 5 mm  Liver:  No focal lesion identified. Within normal limits in parenchymal echogenicity.  IMPRESSION: No acute abnormality noted.   Electronically Signed   By: Alcide CleverMark  Lukens M.D.   On: 09/12/2013 18:44    Micro Results      No results found for this or any previous visit (from the past 240 hour(s)).   History of present illness and  Hospital Course:     Kindly see H&P for history of present illness and admission details, please review complete Labs, Consult reports and Test reports for all details in brief Megan Glover, is a 68 y.o. female, patient with history of  IBS, fibromyalgia, anxiety, HTN, Hypothyroidism was admitted from the Cardiologists office for evaluation of Chrnic Abd pain and poorly controlled BP, Abd pain evaluation was unremarkable including Abd CT and RUQ US ,  she was seen by GI Dr Doneta PublicMaygod and cleared for Home DC, pain likely IBS related, she will follow with GI post DC.  HTN poor control but patient refused Med titration, she will commence Home Meds unchanged and follow with her PCP in a timely fasshion, her Abd pain is much better.         Today   Subjective:   Megan FloroKathleen Rommel today has no headache,no chest abdominal pain,no new weakness tingling or numbness, feels much better wants to go home today.    Objective:   Blood pressure 136/60, pulse 76, temperature 98.4 F (36.9 C), temperature source Oral, resp. rate 18, height 5' 7.5" (1.715 m), weight 72.1 kg (158 lb 15.2 oz), SpO2 96.00%.   Intake/Output Summary (Last 24 hours) at 09/13/13 1746 Last data filed at 09/13/13 1550  Gross per 24 hour  Intake    363 ml  Output      0 ml  Net    363 ml    Exam Awake Alert, Oriented *3, No new F.N deficits, Normal affect [place].AT,PERRAL Supple Neck,No JVD, No cervical lymphadenopathy appriciated.  Symmetrical Chest wall movement, Good air movement bilaterally, CTAB RRR,No Gallops,Rubs or new Murmurs, No Parasternal Heave +ve B.Sounds, Abd Soft, Non tender, No organomegaly appriciated, No rebound -guarding or rigidity. No Cyanosis, Clubbing or edema, No new Rash or bruise  Data Review   No results found for this basename: TSH     CBC w Diff: Lab Results  Component Value Date   WBC 8.8 09/12/2013   HGB 14.3 09/12/2013   HCT 39.8 09/12/2013   PLT 197 09/12/2013   LYMPHOPCT 18 09/12/2013   MONOPCT 8 09/12/2013   EOSPCT 1 09/12/2013   BASOPCT 0 09/12/2013    CMP: Lab  Results  Component Value Date   NA 139 09/12/2013   K 4.1 09/12/2013   CL 101 09/12/2013   CO2 21 09/12/2013   BUN 13 09/12/2013   CREATININE 0.68 09/12/2013   PROT 7.6 09/12/2013   ALBUMIN 4.1 09/12/2013   BILITOT 0.5 09/12/2013   ALKPHOS 66 09/12/2013   AST 29 09/12/2013   ALT 21 09/12/2013  .   Total Time in preparing paper work, data evaluation and todays exam - 35  minutes  Leroy Sea M.D on 09/13/2013 at 5:46 PM  [facility] Office  [phone]

## 2013-09-13 NOTE — Discharge Instructions (Signed)
Follow with Primary MD FRIED, ROBERT L, MD in 7 days   Get CBC, CMP, checked 7 days by Primary MD and again as instructed by your Primary MD.    Activity: As tolerated with Full fall precautions use walker/cane & assistance as needed   Disposition Home     Diet: Heart Healthy    For Heart failure patients - Check your Weight same time everyday, if you gain over 2 pounds, or you develop in leg swelling, experience more shortness of breath or chest pain, call your Primary MD immediately. Follow Cardiac Low Salt Diet and 1.8 lit/day fluid restriction.   On your next visit with her primary care physician please Get Medicines reviewed and adjusted.  Please request your Prim.MD to go over all Hospital Tests and Procedure/Radiological results at the follow up, please get all Hospital records sent to your Prim MD by signing hospital release before you go home.   If you experience worsening of your admission symptoms, develop shortness of breath, life threatening emergency, suicidal or homicidal thoughts you must seek medical attention immediately by calling 911 or calling your MD immediately  if symptoms less severe.  You Must read complete instructions/literature along with all the possible adverse reactions/side effects for all the Medicines you take and that have been prescribed to you. Take any new Medicines after you have completely understood and accpet all the possible adverse reactions/side effects.   Do not drive and provide baby sitting services if your were admitted for syncope or siezures until you have seen by Primary MD or a Neurologist and advised to do so again.  Do not drive when taking Pain medications.    Do not take more than prescribed Pain, Sleep and Anxiety Medications  Special Instructions: If you have smoked or chewed Tobacco  in the last 2 yrs please stop smoking, stop any regular Alcohol  and or any Recreational drug use.  Wear Seat belts while  driving.   Please note  You were cared for by a hospitalist during your hospital stay. If you have any questions about your discharge medications or the care you received while you were in the hospital after you are discharged, you can call the unit and asked to speak with the hospitalist on call if the hospitalist that took care of you is not available. Once you are discharged, your primary care physician will handle any further medical issues. Please note that NO REFILLS for any discharge medications will be authorized once you are discharged, as it is imperative that you return to your primary care physician (or establish a relationship with a primary care physician if you do not have one) for your aftercare needs so that they can reassess your need for medications and monitor your lab values.

## 2013-09-13 NOTE — Progress Notes (Signed)
Patient Demographics  Megan Glover, is a 68 y.o. female, DOB - 1946-05-30, WJX:[id number]  Admit date - 09/12/2013   Admitting Physician Haydee Monicaachal A David, MD  Outpatient Primary MD for the patient is FRIED, Doris CheadleOBERT L, MD  LOS - 1   Chief Complaint  Patient presents with  . Hypertension  . Abdominal Pain        Assessment & Plan    1. Poorly controlled hypertension. No evidence of hypertensive crisis in my examination, have adjusted her blood pressure medications and essentially increased hydralazine, continue present dose Coreg ARB at home dose and monitor.   2. Nonspecific Abdominal pain, acute, epigastric - CT reports by radiologist read, no acute findings, bicarbonate stable, doubt this is ischemic bowel, requested GI to evaluate. She does have history of IBS.    3. Anxiety. Continue Xanax at home dose, continue BuSpar.    4. Chronic muscle aches/fibromyalgia. On Flexeril which will be continued.    5. Hypothyroidism. on home dose Synthroid.     Code Status: full  Family Communication:    Disposition Plan: home   Procedures CT Abd Pelvis   Consults  GI   Medications  Scheduled Meds: . B-complex with vitamin C  1 tablet Oral Daily  . busPIRone  15 mg Oral TID  . carvedilol  12.5 mg Oral BID WC  . cholecalciferol  2,000 Units Oral Daily  . hydrALAZINE  50 mg Oral TID  . levothyroxine  50 mcg Oral QAC breakfast  . sodium chloride  3 mL Intravenous Q12H  . sodium chloride  3 mL Intravenous Q12H  . vitamin E  400 Units Oral Daily   Continuous Infusions:  PRN Meds:.sodium chloride, acetaminophen, albuterol, ALPRAZolam, alum & mag hydroxide-simeth, cyclobenzaprine, ondansetron (ZOFRAN) IV, sodium chloride  DVT Prophylaxis  Lovenox   Lab Results  Component Value  Date   PLT 197 09/12/2013    Antibiotics    Anti-infectives   None          Subjective:   Megan Glover today has, No headache, No chest pain, mild epigastric abdominal pain - No Nausea, No new weakness tingling or numbness, No Cough - SOB.    Objective:   Filed Vitals:   09/13/13 0015 09/13/13 0045 09/13/13 0200 09/13/13 0647  BP: 174/70 163/75 173/79 163/68  Pulse: 91 87 87 89  Temp:   98.1 F (36.7 C) 98 F (36.7 C)  TempSrc:   Oral Oral  Resp: 23 19 20 17   Height:   5' 7.5" (1.715 m)   Weight:   72.1 kg (158 lb 15.2 oz)   SpO2: 96% 90% 93% 95%    Wt Readings from Last 3 Encounters:  09/13/13 72.1 kg (158 lb 15.2 oz)  09/12/13 73.483 kg (162 lb)  08/14/13 73.029 kg (161 lb)    No intake or output data in the 24 hours ending 09/13/13 1110  Exam Awake Alert, Oriented X 3, No new F.N deficits, Normal affect [place].AT,PERRAL Supple Neck,No JVD, No cervical lymphadenopathy appriciated.  Symmetrical Chest wall movement, Good air movement bilaterally, CTAB RRR,No Gallops,Rubs or new Murmurs, No Parasternal Heave +ve B.Sounds, Abd Soft, Non tender, No organomegaly appriciated, No rebound - guarding or rigidity. No Cyanosis, Clubbing or edema, No  new Rash or bruise      Data Review   Micro Results No results found for this or any previous visit (from the past 240 hour(s)).  Radiology Reports Dg Chest 2 View  09/12/2013   CLINICAL DATA:  Hypertension, abdominal pain  EXAM: CHEST  2 VIEW  COMPARISON:  DG CHEST 2 VIEW dated 08/14/2013  FINDINGS: The lungs are hyperinflated likely secondary to COPD. There is no focal parenchymal opacity, pleural effusion, or pneumothorax. The heart and mediastinal contours are unremarkable.  The osseous structures are unremarkable.  IMPRESSION: No active cardiopulmonary disease.   Electronically Signed   By: Elige Ko   On: 09/12/2013 16:14   Dg Chest 2 View  08/14/2013   CLINICAL DATA:  Chest pain and congestive heart failure,  wheezing, pneumonia symptoms  EXAM: CHEST  2 VIEW  COMPARISON:  07/16/2013; 11/10/2012; 02/21/2010; chest CT -07/16/2013  FINDINGS: Grossly unchanged cardiac silhouette and mediastinal contours. The lungs remain hyperexpanded with flattening of the bilateral mid diaphragms a mild diffuse slightly nodular thickening of the pulmonary interstitium. No pleural effusion or pneumothorax. No evidence of edema. Unchanged bones.  IMPRESSION: Mild lung hyperexpansion of bronchitic change without acute cardiopulmonary disease. Specifically, no evidence of congestive heart failure or pneumonia.   Electronically Signed   By: Simonne Come M.D.   On: 08/14/2013 14:57   Ct Abdomen Pelvis W Contrast  09/12/2013   ADDENDUM REPORT: 09/12/2013 23:55  ADDENDUM: CT exam and the reconstructions were re- evaluated to assess for mesenteric vessel patency. Aorta is calcified however the celiac, SMA, and IMA appear patent. No evidence of significant mesenteric proximal vascular occlusive process. Superior mesenteric vein also patent.  Negative for bowel obstruction, dilatation, ileus, free air, or pneumatosis. No area of bowel wall thickening.  No CT signs to suggest bowel ischemia.  Findings discussed with Dr. Delton See.   Electronically Signed   By: Ruel Favors M.D.   On: 09/12/2013 23:55   09/12/2013   CLINICAL DATA:  Mid upper abdominal pain, right-sided abdominal pain, nausea and constipation, history of IBS, diverticulitis  EXAM: CT ABDOMEN AND PELVIS WITH CONTRAST  TECHNIQUE: Multidetector CT imaging of the abdomen and pelvis was performed using the standard protocol following bolus administration of intravenous contrast.  CONTRAST:  OMNIPAQUE IOHEXOL 300 MG/ML  SOLN  COMPARISON:  US ABDOMEN LIMITED dated 09/12/2013; CT ABD/PELVIS W CM dated 02/21/2010; CT ABD/PELV WO CM dated 10/15/2009  FINDINGS: Normal hepatic contour. A minimal amount of focal fatty infiltration is noted adjacent to the fissure for ligamentum teres. Normal  appearance of the gallbladder. No intra or extrahepatic biliary duct dilatation. No ascites.  There is symmetric enhancement and excretion of the bilateral kidneys. No definite renal stones on this postcontrast examination. No renal lesions. Incidental note is again made of a right-sided extrarenal pelvis. No urinary obstruction or perinephric stranding. Normal appearance of the urinary bladder given degree of distention. Normal appearance of the bilateral adrenal glands, pancreas and spleen.  Extensive colonic diverticulosis without definite evidence of diverticulitis. Normal appearance of the appendix. No pneumoperitoneum, pneumatosis or portal venous gas.  Rather extensive amount of primarily calcified atherosclerotic plaque with a normal caliber abdominal aorta. The major branch vessels of the abdominal aorta appear patent on this non CTA examination, though there is a suspected hemodynamically significant narrowing involving the origin of the right common iliac artery, incompletely evaluated.  No retroperitoneal, mesenteric, pelvic or inguinal lymphadenopathy.  Normal appearance of the pelvic organs for age. No discrete adnexal  lesion. No free fluid within the pelvis.  Limited visualization of the lower thorax demonstrates minimal grossly symmetric subpleural ground-glass atelectasis. No focal airspace opacities. No pleural effusion. Normal heart size. No pericardial effusion.  No acute or aggressive osseous abnormalities. Schmorl's nodes are demonstrated within the endplates of multiple thoracic and vertebral bodies. Moderate bilateral facet degenerative change of the lower lumbar spine. A bone island is incidentally noted within the proximal left femoral diaphysis. Right-sided mesenteric fat containing inguinal hernia.  IMPRESSION: 1. No definite explanation for patient's upper/right-sided abdominal pain. Specifically, no evidence of enteric or urinary obstruction. Normal appearance of the appendix. 2.  Extensive colonic diverticulosis without definite evidence of diverticulitis. 3. Atherosclerotic plaque within a normal caliber abdominal aorta. 4. Suspected hemodynamically significant narrowing involving the origin of the right common iliac artery, incompletely evaluated on this non CTA examination.  Electronically Signed: By: Simonne Come M.D. On: 09/12/2013 20:38   US Abdomen Limited  09/12/2013   CLINICAL DATA:  Right upper quadrant pain  EXAM: US ABDOMEN LIMITED - RIGHT UPPER QUADRANT  COMPARISON:  None.  FINDINGS: Gallbladder:  No gallstones or wall thickening visualized. No sonographic Murphy sign noted.  Common bile duct:  Diameter: 5 mm  Liver:  No focal lesion identified. Within normal limits in parenchymal echogenicity.  IMPRESSION: No acute abnormality noted.   Electronically Signed   By: Alcide Clever M.D.   On: 09/12/2013 18:44    CBC  Recent Labs Lab 09/12/13 1647  WBC 8.8  HGB 14.3  HCT 39.8  PLT 197  MCV 88.8  MCH 31.9  MCHC 35.9  RDW 12.5  LYMPHSABS 1.6  MONOABS 0.7  EOSABS 0.1  BASOSABS 0.0    Chemistries   Recent Labs Lab 09/12/13 1647  NA 139  K 4.1  CL 101  CO2 21  GLUCOSE 101*  BUN 13  CREATININE 0.68  CALCIUM 9.6  AST 29  ALT 21  ALKPHOS 66  BILITOT 0.5   ------------------------------------------------------------------------------------------------------------------ estimated creatinine clearance is 66.7 ml/min (by C-G formula based on Cr of 0.68). ------------------------------------------------------------------------------------------------------------------ No results found for this basename: HGBA1C,  in the last 72 hours ------------------------------------------------------------------------------------------------------------------ No results found for this basename: CHOL, HDL, LDLCALC, TRIG, CHOLHDL, LDLDIRECT,  in the last 72  hours ------------------------------------------------------------------------------------------------------------------ No results found for this basename: TSH, T4TOTAL, FREET3, T3FREE, THYROIDAB,  in the last 72 hours ------------------------------------------------------------------------------------------------------------------ No results found for this basename: VITAMINB12, FOLATE, FERRITIN, TIBC, IRON, RETICCTPCT,  in the last 72 hours  Coagulation profile No results found for this basename: INR, PROTIME,  in the last 168 hours  No results found for this basename: DDIMER,  in the last 72 hours  Cardiac Enzymes  Recent Labs Lab 09/12/13 2245 09/13/13 0402 09/13/13 0745  TROPONINI <0.30 <0.30 <0.30   ------------------------------------------------------------------------------------------------------------------ No components found with this basename: POCBNP,      Time Spent in minutes  35   Willamae Demby K M.D on 09/13/2013 at 11:10 AM  Between 7am to 7pm - Pager - [phone]  After 7pm go to [email or web address] - password [email or web address]  And look for the night coverage person covering for me after hours  [facility] Office  [phone]

## 2013-12-10 ENCOUNTER — Emergency Department (HOSPITAL_BASED_OUTPATIENT_CLINIC_OR_DEPARTMENT_OTHER)
Admission: EM | Admit: 2013-12-10 | Discharge: 2013-12-10 | Disposition: A | Discharge status: Home / Self Care | Payer: Medicare Other | Attending: Emergency Medicine | Admitting: Emergency Medicine

## 2013-12-10 ENCOUNTER — Emergency Department (HOSPITAL_BASED_OUTPATIENT_CLINIC_OR_DEPARTMENT_OTHER): Payer: Medicare Other

## 2013-12-10 ENCOUNTER — Encounter (HOSPITAL_BASED_OUTPATIENT_CLINIC_OR_DEPARTMENT_OTHER): Payer: Self-pay | Admitting: Emergency Medicine

## 2013-12-10 DIAGNOSIS — F411 Generalized anxiety disorder: Secondary | ICD-10-CM | POA: Insufficient documentation

## 2013-12-10 DIAGNOSIS — E039 Hypothyroidism, unspecified: Secondary | ICD-10-CM | POA: Insufficient documentation

## 2013-12-10 DIAGNOSIS — Z8739 Personal history of other diseases of the musculoskeletal system and connective tissue: Secondary | ICD-10-CM | POA: Insufficient documentation

## 2013-12-10 DIAGNOSIS — Z8719 Personal history of other diseases of the digestive system: Secondary | ICD-10-CM | POA: Insufficient documentation

## 2013-12-10 DIAGNOSIS — Z79899 Other long term (current) drug therapy: Secondary | ICD-10-CM | POA: Insufficient documentation

## 2013-12-10 DIAGNOSIS — F419 Anxiety disorder, unspecified: Secondary | ICD-10-CM

## 2013-12-10 DIAGNOSIS — R51 Headache: Secondary | ICD-10-CM | POA: Insufficient documentation

## 2013-12-10 DIAGNOSIS — I708 Atherosclerosis of other arteries: Secondary | ICD-10-CM | POA: Insufficient documentation

## 2013-12-10 DIAGNOSIS — R519 Headache, unspecified: Secondary | ICD-10-CM

## 2013-12-10 DIAGNOSIS — I10 Essential (primary) hypertension: Secondary | ICD-10-CM

## 2013-12-10 DIAGNOSIS — K4090 Unilateral inguinal hernia, without obstruction or gangrene, not specified as recurrent: Secondary | ICD-10-CM | POA: Insufficient documentation

## 2013-12-10 DIAGNOSIS — I771 Stricture of artery: Secondary | ICD-10-CM

## 2013-12-10 LAB — URINALYSIS, ROUTINE W REFLEX MICROSCOPIC
Bilirubin Urine: NEGATIVE
GLUCOSE, UA: NEGATIVE mg/dL
Hgb urine dipstick: NEGATIVE
Ketones, ur: 15 mg/dL — AB
LEUKOCYTES UA: NEGATIVE
Nitrite: NEGATIVE
PH: 6 (ref 5.0–8.0)
Protein, ur: NEGATIVE mg/dL
SPECIFIC GRAVITY, URINE: 1.009 (ref 1.005–1.030)
Urobilinogen, UA: 0.2 mg/dL (ref 0.0–1.0)

## 2013-12-10 LAB — BASIC METABOLIC PANEL
BUN: 14 mg/dL (ref 6–23)
CHLORIDE: 97 meq/L (ref 96–112)
CO2: 24 meq/L (ref 19–32)
Calcium: 10 mg/dL (ref 8.4–10.5)
Creatinine, Ser: 0.8 mg/dL (ref 0.50–1.10)
GFR calc Af Amer: 86 mL/min — ABNORMAL LOW (ref >90)
GFR calc non Af Amer: 74 mL/min — ABNORMAL LOW (ref >90)
Glucose, Bld: 120 mg/dL — ABNORMAL HIGH (ref 70–99)
Potassium: 4.5 mEq/L (ref 3.7–5.3)
SODIUM: 136 meq/L — AB (ref 137–147)

## 2013-12-10 LAB — CBC WITH DIFFERENTIAL/PLATELET
Basophils Absolute: 0 10*3/uL (ref 0.0–0.1)
Basophils Relative: 0 % (ref 0–1)
Eosinophils Absolute: 0.1 10*3/uL (ref 0.0–0.7)
Eosinophils Relative: 1 % (ref 0–5)
HEMATOCRIT: 41.7 % (ref 36.0–46.0)
Hemoglobin: 14.8 g/dL (ref 12.0–15.0)
LYMPHS ABS: 1.9 10*3/uL (ref 0.7–4.0)
LYMPHS PCT: 20 % (ref 12–46)
MCH: 32 pg (ref 26.0–34.0)
MCHC: 35.5 g/dL (ref 30.0–36.0)
MCV: 90.3 fL (ref 78.0–100.0)
Monocytes Absolute: 1.1 10*3/uL — ABNORMAL HIGH (ref 0.1–1.0)
Monocytes Relative: 12 % (ref 3–12)
NEUTROS ABS: 6.5 10*3/uL (ref 1.7–7.7)
Neutrophils Relative %: 67 % (ref 43–77)
PLATELETS: 218 10*3/uL (ref 150–400)
RBC: 4.62 MIL/uL (ref 3.87–5.11)
RDW: 12.3 % (ref 11.5–15.5)
WBC: 9.7 10*3/uL (ref 4.0–10.5)

## 2013-12-10 LAB — TROPONIN I

## 2013-12-10 MED ORDER — HYDROCODONE-ACETAMINOPHEN 5-325 MG PO TABS: 1.0000 | ORAL_TABLET | ORAL | Status: DC | PRN

## 2013-12-10 MED ORDER — HYDRALAZINE HCL 20 MG/ML IJ SOLN
10.0000 mg | Freq: Once | INTRAMUSCULAR | Status: AC
  Administered 2013-12-10: 10 mg via INTRAVENOUS
  Filled 2013-12-10: qty 1

## 2013-12-10 MED ORDER — MORPHINE SULFATE 4 MG/ML IJ SOLN
4.0000 mg | Freq: Once | INTRAMUSCULAR | Status: AC
  Administered 2013-12-10: 4 mg via INTRAVENOUS
  Filled 2013-12-10: qty 1

## 2013-12-10 MED ORDER — IOHEXOL 350 MG/ML SOLN
100.0000 mL | Freq: Once | INTRAVENOUS | Status: AC | PRN
  Administered 2013-12-10: 100 mL via INTRAVENOUS

## 2013-12-10 NOTE — ED Notes (Signed)
MD at bedside. 

## 2013-12-10 NOTE — ED Provider Notes (Signed)
TIME SEEN: 6:33 PM  CHIEF COMPLAINT: HTN  HPI: HPI Comments: Megan Glover is a 68 y.o. female with history of HTN, hyperthyroidism, fibromyalgia, irritable bowel syndrome who presents to the Emergency Department complaining of elevated blood pressure. She states that she is also having right-sided throbbing headache and eye pain has been present intermittently for the past 3 weeks. She has had similar headaches in the past when her blood pressure was elevated. She states she is in the emergency department today because it is worse than normal and her blood pressure at home is 225/28. She states that she is currently on propanolol, hydralazine, spironolactone and losartan. She is taking his medications today and not missed any doses. She is keeping a log of her blood pressure and will occasionally have blood pressures with systolic readings of 130-140.  She was recently weaned off clonidine and she was experiencing side effects from this medication. Patient is also concerned because she has a CT scan that showed possible right iliac artery stenosis that was performed without contrast. She was told by her PCP that she began experiencing leg pain she should come to the emergency department immediately. She states she has had pain in her right groin for the past 2 weeks that she is now here to be evaluated for. She states that her pain is worse at night at times she will experience her extremities being cooler than normal. No discoloration of her skin. No numbness or weakness. No swelling of her leg. She is also concerned about a DVT she states there is a family history of DVTs. She denies a prior history of DVT or PE for herself. She denies any chest pain or shortness of breath. No nausea, vomiting or diarrhea. No history of head injury. No thunderclap headache or sudden onset headache.  No vision or hearing changes. She has an appointment with her primary care physician Dr. Betty SwazilandJordan tomorrow at 1:15  PM.  ROS: See HPI Constitutional: no fever  Eyes: no drainage  ENT: no runny nose   Cardiovascular:  no chest pain  Resp: no SOB  GI: no vomiting GU: no dysuria Integumentary: no rash  Allergy: no hives  Musculoskeletal: no leg swelling  Neurological: no slurred speech ROS otherwise negative  PAST MEDICAL HISTORY/PAST SURGICAL HISTORY:  Past Medical History  Diagnosis Date  . Hypertension   . Thyroid disease   . History of diverticulosis   . Fibromyalgia   . Hypothyroidism   . IBS (irritable bowel syndrome)     MEDICATIONS:  Prior to Admission medications   Medication Sig Start Date End Date Taking? Authorizing Provider  citalopram (CELEXA) 10 MG tablet Take 10 mg by mouth daily.   Yes Historical Provider, MD  propranolol (INNOPRAN XL) 120 MG 24 hr capsule Take 120 mg by mouth at bedtime.   Yes Historical Provider, MD  SPIRONOLACTONE PO Take by mouth.   Yes Historical Provider, MD  acetaminophen (TYLENOL) 500 MG tablet Take 1,000 mg by mouth daily as needed for moderate pain.    Historical Provider, MD  albuterol (PROVENTIL HFA;VENTOLIN HFA) 108 (90 BASE) MCG/ACT inhaler Inhale 2 puffs into the lungs every 8 (eight) hours as needed for wheezing or shortness of breath.    Historical Provider, MD  ALPRAZolam Prudy Feeler(XANAX) 0.25 MG tablet Take 0.25-0.75 mg by mouth See admin instructions. Patient can take 1-3 tablets by mouth at bedtime as needed for sleep.    Historical Provider, MD  B Complex-C (B-COMPLEX WITH VITAMIN C) tablet  Take 1 tablet by mouth daily.    Historical Provider, MD  Biotin (PA BIOTIN) 1000 MCG tablet Take 1,000 mcg by mouth daily.    Historical Provider, MD  busPIRone (BUSPAR) 15 MG tablet Take 15 mg by mouth 3 (three) times daily.    Historical Provider, MD  carvedilol (COREG) 12.5 MG tablet Take 12.5 mg by mouth 2 (two) times daily with a meal.    Historical Provider, MD  cholecalciferol (VITAMIN D) 1000 UNITS tablet Take 2,000 Units by mouth daily.    Historical  Provider, MD  cyclobenzaprine (FLEXERIL) 10 MG tablet Take 10 mg by mouth daily as needed for muscle spasms.     Historical Provider, MD  hydrALAZINE (APRESOLINE) 10 MG tablet Take 10 mg by mouth 3 (three) times daily.    Historical Provider, MD  levothyroxine (SYNTHROID, LEVOTHROID) 50 MCG tablet Take 50 mcg by mouth daily before breakfast.    Historical Provider, MD  losartan (COZAAR) 100 MG tablet Take 100 mg by mouth daily.    Historical Provider, MD  magnesium oxide (MAG-OX) 400 MG tablet Take 400 mg by mouth daily.    Historical Provider, MD  Multiple Vitamins-Minerals (ZINC PO) Take 1 capsule by mouth daily.    Historical Provider, MD  OVER THE COUNTER MEDICATION Apply 2 drops to eye at bedtime as needed (for itchy dry eyes.).    Historical Provider, MD  vitamin E 400 UNIT capsule Take 400 Units by mouth daily.    Historical Provider, MD    ALLERGIES:  Allergies  Allergen Reactions  . Clindamycin/Lincomycin     CDIF  . Demerol [Meperidine] Nausea And Vomiting, Other (See Comments) and Hypertension    Severe headache  . Erythromycin Diarrhea  . Sulfa Antibiotics Other (See Comments)    Anal itching    SOCIAL HISTORY:  History  Substance Use Topics  . Smoking status: Never Smoker   . Smokeless tobacco: Never Used  . Alcohol Use: No    FAMILY HISTORY: Family History  Problem Relation Age of Onset  . Breast cancer Mother     breast CA  . Diabetes Father   . Hypertension Father   . Colon cancer      EXAM: Triage Vitals: BP 227/141  Pulse 74  Temp(Src) 97.7 F (36.5 C) (Oral)  Resp 20  Ht 5\' 7"  (1.702 m)  Wt 158 lb (71.668 kg)  BMI 24.74 kg/m2  SpO2 98% CONSTITUTIONAL: Alert and oriented and responds appropriately to questions. Well-appearing; well-nourished, well-appearing, nontoxic, pleasant HEAD: Normocephalic EYES: Conjunctivae clear, PERRL ENT: normal nose; no rhinorrhea; moist mucous membranes; pharynx without lesions noted NECK: Supple, no meningismus,  no LAD  CARD: RRR; S1 and S2 appreciated; no murmurs, no clicks, no rubs, no gallops RESP: Normal chest excursion without splinting or tachypnea; breath sounds clear and equal bilaterally; no wheezes, no rhonchi, no rales,  ABD/GI: Normal bowel sounds; non-distended; soft, non-tender, no rebound, no guarding, no hernia appreciated on exam. BACK:  The back appears normal and is non-tender to palpation, there is no CVA tenderness EXT: Normal ROM in all joints; non-tender to palpation; no edema; normal capillary refill; no cyanosis; no calf tenderness or swelling, patient has 2+ femoral pulses bilaterally, no tenderness to palpation of her groin region, no hernia, no sign of infection, no pain with movement of her right hip, 1+ DP pulses bilaterally, feet are slightly cool to touch bilaterally but are perfused SKIN: Normal color for age and race; warm NEURO: Moves all extremities equally,  sensation to light touch intact diffusely, cranial nerves II through XII intact PSYCH: The patient's mood and manner are appropriate. Grooming and personal hygiene are appropriate.  MEDICAL DECISION MAKING: Patient here with very labile blood pressure. She's extremely hypertensive in the emergency department but according to her blood pressure log she will have blood pressures that range from 130s to 230 systolic. She is also complaining of headache and right lower extremity pain. Given she is very well-appearing and neurologically intact, as low suspicion for intracranial hemorrhage we will obtain a CT of her head today. She denies a thunderclap or sudden onset headache. She's had similar headaches in the past. We'll treat her blood pressure as well as her pain. We'll give IV hydralazine and morphine. Her EKG shows no new ischemic changes. We'll obtain screening labs and urine to evaluate for signs of end organ damage from her hypertension. Patient is also complaining of pain in her groin and has had a CT scan which showed  possible right iliac artery stenosis. She has slightly diminished pulses in her feet but this is equal bilaterally. We'll obtain a CT of patient's right lower extremity to evaluate her arteries. We'll also obtain an ultrasound to evaluate for DVT she states she has a family history of DVTs. Does have a history of a right inguinal hernia but there is no hernia appreciated on exam. She has an appointment to followup with her primary care physician tomorrow.  ED PROGRESS: Patient's blood pressure has been improvement but she is less anxious and her pain is been treated. She states her headache is now gone. Head CT shows no intracranial hemorrhage. She has no DVT seen on a venous Doppler ultrasound. 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 Have discussed with patient that I do not feel she needs emergent treatment for this but that she should followup with a vascular surgeon. She does report she has some symptoms of claudication with walking on a treadmill. Have also discussed with patient that her right inguinal pain may be related to her inguinal hernia and she may followup with surgery as needed for this. She has an appointment with her PCP tomorrow. Have discussed strict return precautions. Patient verbalizes understanding and is comfortable with plan for discharge home with close outpatient management.     EKG Interpretation  Date/Time:  Tuesday December 10 2013 18:23:09 EDT Ventricular Rate:  59 PR Interval:  158 QRS Duration: 76 QT Interval:  412 QTC Calculation: 407 R Axis:   53 Text Interpretation:  Sinus bradycardia Possible Left atrial enlargement Borderline ECG No significant change since last tracing Confirmed by Leslieanne Cobarrubias,  DO, Laasya Peyton [id number]) on 12/10/2013 6:30:44 PM        Layla Maw Arriana Lohmann, DO 12/10/13 2352

## 2013-12-10 NOTE — ED Notes (Signed)
Headaches intermittently x 3 weeks and seems worse today.

## 2013-12-10 NOTE — ED Notes (Signed)
Headache. States she took her BP and home and it was 225/188.

## 2013-12-10 NOTE — ED Notes (Signed)
Patient transported to CT 

## 2013-12-10 NOTE — ED Notes (Signed)
Pt still unable to give us a urine sample. RN Aundra MilletMegan informed

## 2013-12-10 NOTE — Discharge Instructions (Signed)
Hypertension As your heart beats, it forces blood through your arteries. This force is your blood pressure. If the pressure is too high, it is called hypertension (HTN) or high blood pressure. HTN is dangerous because you may have it and not know it. High blood pressure may mean that your heart has to work harder to pump blood. Your arteries may be narrow or stiff. The extra work puts you at risk for heart disease, stroke, and other problems.  Blood pressure consists of two numbers, a higher number over a lower, 110/72, for example. It is stated as "110 over 72." The ideal is below 120 for the top number (systolic) and under 80 for the bottom (diastolic). Write down your blood pressure today. You should pay close attention to your blood pressure if you have certain conditions such as:  Heart failure.  Prior heart attack.  Diabetes  Chronic kidney disease.  Prior stroke.  Multiple risk factors for heart disease. To see if you have HTN, your blood pressure should be measured while you are seated with your arm held at the level of the heart. It should be measured at least twice. A one-time elevated blood pressure reading (especially in the Emergency Department) does not mean that you need treatment. There may be conditions in which the blood pressure is different between your right and left arms. It is important to see your caregiver soon for a recheck. Most people have essential hypertension which means that there is not a specific cause. This type of high blood pressure may be lowered by changing lifestyle factors such as:  Stress.  Smoking.  Lack of exercise.  Excessive weight.  Drug/tobacco/alcohol use.  Eating less salt. Most people do not have symptoms from high blood pressure until it has caused damage to the body. Effective treatment can often prevent, delay or reduce that damage. TREATMENT  When a cause has been identified, treatment for high blood pressure is directed at the  cause. There are a large number of medications to treat HTN. These fall into several categories, and your caregiver will help you select the medicines that are best for you. Medications may have side effects. You should review side effects with your caregiver. If your blood pressure stays high after you have made lifestyle changes or started on medicines,   Your medication(s) may need to be changed.  Other problems may need to be addressed.  Be certain you understand your prescriptions, and know how and when to take your medicine.  Be sure to follow up with your caregiver within the time frame advised (usually within two weeks) to have your blood pressure rechecked and to review your medications.  If you are taking more than one medicine to lower your blood pressure, make sure you know how and at what times they should be taken. Taking two medicines at the same time can result in blood pressure that is too low. SEEK IMMEDIATE MEDICAL CARE IF:  You develop a severe headache, blurred or changing vision, or confusion.  You have unusual weakness or numbness, or a faint feeling.  You have severe chest or abdominal pain, vomiting, or breathing problems. MAKE SURE YOU:   Understand these instructions.  Will watch your condition.  Will get help right away if you are not doing well or get worse. Document Released: 08/01/2005 Document Revised: 10/24/2011 Document Reviewed: 03/21/2008 [facility] Patient Information 2014 [place], [place].  Inguinal Hernia, Adult Muscles help keep everything in the body in its proper place. But  if a weak spot in the muscles develops, something can poke through. That is called a hernia. When this happens in the lower part of the belly (abdomen), it is called an inguinal hernia. (It takes its name from a part of the body in this region called the inguinal canal.) A weak spot in the wall of muscles lets some fat or part of the small intestine bulge through. An inguinal  hernia can develop at any age. Men get them more often than women. CAUSES  In adults, an inguinal hernia develops over time.  It can be triggered by:  Suddenly straining the muscles of the lower abdomen.  Lifting heavy objects.  Straining to have a bowel movement. Difficult bowel movements (constipation) can lead to this.  Constant coughing. This may be caused by smoking or lung disease.  Being overweight.  Being pregnant.  Working at a job that requires long periods of standing or heavy lifting.  Having had an inguinal hernia before. One type can be an emergency situation. It is called a strangulated inguinal hernia. It develops if part of the small intestine slips through the weak spot and cannot get back into the abdomen. The blood supply can be cut off. If that happens, part of the intestine may die. This situation requires emergency surgery. SYMPTOMS  Often, a small inguinal hernia has no symptoms. It is found when a healthcare provider does a physical exam. Larger hernias usually have symptoms.   In adults, symptoms may include:  A lump in the groin. This is easier to see when the person is standing. It might disappear when lying down.  In men, a lump in the scrotum.  Pain or burning in the groin. This occurs especially when lifting, straining or coughing.  A dull ache or feeling of pressure in the groin.  Signs of a strangulated hernia can include:  A bulge in the groin that becomes very painful and tender to the touch.  A bulge that turns red or purple.  Fever, nausea and vomiting.  Inability to have a bowel movement or to pass gas. DIAGNOSIS  To decide if you have an inguinal hernia, a healthcare provider will probably do a physical examination.  This will include asking questions about any symptoms you have noticed.  The healthcare provider might feel the groin area and ask you to cough. If an inguinal hernia is felt, the healthcare provider may try to slide  it back into the abdomen.  Usually no other tests are needed. TREATMENT  Treatments can vary. The size of the hernia makes a difference. Options include:  Watchful waiting. This is often suggested if the hernia is small and you have had no symptoms.  No medical procedure will be done unless symptoms develop.  You will need to watch closely for symptoms. If any occur, contact your healthcare provider right away.  Surgery. This is used if the hernia is larger or you have symptoms.  Open surgery. This is usually an outpatient procedure (you will not stay overnight in a hospital). An cut (incision) is made through the skin in the groin. The hernia is put back inside the abdomen. The weak area in the muscles is then repaired by herniorrhaphy or hernioplasty. Herniorrhaphy: in this type of surgery, the weak muscles are sewn back together. Hernioplasty: a patch or mesh is used to close the weak area in the abdominal wall.  Laparoscopy. In this procedure, a surgeon makes small incisions. A thin tube with a tiny  video camera (called a laparoscope) is put into the abdomen. The surgeon repairs the hernia with mesh by looking with the video camera and using two long instruments. HOME CARE INSTRUCTIONS   After surgery to repair an inguinal hernia:  You will need to take pain medicine prescribed by your healthcare provider. Follow all directions carefully.  You will need to take care of the wound from the incision.  Your activity will be restricted for awhile. This will probably include no heavy lifting for several weeks. You also should not do anything too active for a few weeks. When you can return to work will depend on the type of job that you have.  During "watchful waiting" periods, you should:  Maintain a healthy weight.  Eat a diet high in fiber (fruits, vegetables and whole grains).  Drink plenty of fluids to avoid constipation. This means drinking enough water and other liquids to keep  your urine clear or pale yellow.  Do not lift heavy objects.  Do not stand for long periods of time.  Quit smoking. This should keep you from developing a frequent cough. SEEK MEDICAL CARE IF:   A bulge develops in your groin area.  You feel pain, a burning sensation or pressure in the groin. This might be worse if you are lifting or straining.  You develop a fever of more than 100.5 F (38.1 C). SEEK IMMEDIATE MEDICAL CARE IF:   Pain in the groin increases suddenly.  A bulge in the groin gets bigger suddenly and does not go down.  For men, there is sudden pain in the scrotum. Or, the size of the scrotum increases.  A bulge in the groin area becomes red or purple and is painful to touch.  You have nausea or vomiting that does not go away.  You feel your heart beating much faster than normal.  You cannot have a bowel movement or pass gas.  You develop a fever of more than 102.0 F (38.9 C). Document Released: 12/18/2008 Document Revised: 10/24/2011 Document Reviewed: 12/18/2008 [facility] Patient Information 2014 [place], [place].

## 2014-01-22 ENCOUNTER — Ambulatory Visit (INDEPENDENT_AMBULATORY_CARE_PROVIDER_SITE_OTHER): Payer: PRIVATE HEALTH INSURANCE | Admitting: Surgery

## 2014-01-30 ENCOUNTER — Ambulatory Visit (INDEPENDENT_AMBULATORY_CARE_PROVIDER_SITE_OTHER): Payer: Medicare Other | Admitting: Surgery

## 2014-01-30 ENCOUNTER — Encounter (INDEPENDENT_AMBULATORY_CARE_PROVIDER_SITE_OTHER): Payer: Self-pay | Admitting: Surgery

## 2014-01-30 VITALS — BP 146/86 | HR 86 | Temp 98.1°F | Ht 67.0 in | Wt 160.0 lb

## 2014-01-30 DIAGNOSIS — K4030 Unilateral inguinal hernia, with obstruction, without gangrene, not specified as recurrent: Secondary | ICD-10-CM

## 2014-01-30 DIAGNOSIS — K4090 Unilateral inguinal hernia, without obstruction or gangrene, not specified as recurrent: Secondary | ICD-10-CM

## 2014-01-30 DIAGNOSIS — K5900 Constipation, unspecified: Secondary | ICD-10-CM

## 2014-01-30 DIAGNOSIS — K5909 Other constipation: Secondary | ICD-10-CM

## 2014-01-30 NOTE — Patient Instructions (Signed)
Please consider the recommendations that we have given you today:  Consider surgery to laparoscopically repair the hernia in her right groin.  Also checked to make sure there is not one on the left side.  Reconsider Fiber regimen to help improve constipation.  See the Handout(s) we have given you.  Please call our office at [phone] if you wish to schedule surgery or if you have further questions / concerns.   Hernia A hernia occurs when an internal organ pushes out through a weak spot in the abdominal wall. Hernias most commonly occur in the groin and around the navel. Hernias often can be pushed back into place (reduced). Most hernias tend to get worse over time. Some abdominal hernias can get stuck in the opening (irreducible or incarcerated hernia) and cannot be reduced. An irreducible abdominal hernia which is tightly squeezed into the opening is at risk for impaired blood supply (strangulated hernia). A strangulated hernia is a medical emergency. Because of the risk for an irreducible or strangulated hernia, surgery may be recommended to repair a hernia. CAUSES   Heavy lifting.  Prolonged coughing.  Straining to have a bowel movement.  A cut (incision) made during an abdominal surgery. HOME CARE INSTRUCTIONS   Bed rest is not required. You may continue your normal activities.  Avoid lifting more than 10 pounds (4.5 kg) or straining.  Cough gently. If you are a smoker it is best to stop. Even the best hernia repair can break down with the continual strain of coughing. Even if you do not have your hernia repaired, a cough will continue to aggravate the problem.  Do not wear anything tight over your hernia. Do not try to keep it in with an outside bandage or truss. These can damage abdominal contents if they are trapped within the hernia sac.  Eat a normal diet.  Avoid constipation. Straining over long periods of time will increase hernia size and encourage breakdown of  repairs. If you cannot do this with diet alone, stool softeners may be used. SEEK IMMEDIATE MEDICAL CARE IF:   You have a fever.  You develop increasing abdominal pain.  You feel nauseous or vomit.  Your hernia is stuck outside the abdomen, looks discolored, feels hard, or is tender.  You have any changes in your bowel habits or in the hernia that are unusual for you.  You have increased pain or swelling around the hernia.  You cannot push the hernia back in place by applying gentle pressure while lying down. MAKE SURE YOU:   Understand these instructions.  Will watch your condition.  Will get help right away if you are not doing well or get worse. Document Released: 08/01/2005 Document Revised: 10/24/2011 Document Reviewed: 03/20/2008 [facility] Patient Information 2015 [place], [place]. This information is not intended to replace advice given to you by your health care provider. Make sure you discuss any questions you have with your health care provider.  HERNIA REPAIR: POST OP INSTRUCTIONS  1. DIET: Follow a light bland diet the first 24 hours after arrival home, such as soup, liquids, crackers, etc.  Be sure to include lots of fluids daily.  Avoid fast food or heavy meals as your are more likely to get nauseated.  Eat a low fat the next few days after surgery. 2. Take your usually prescribed home medications unless otherwise directed. 3. PAIN CONTROL: a. Pain is best controlled by a usual combination of three different methods TOGETHER: i. Ice/Heat ii. Over the counter  pain medication iii. Prescription pain medication b. Most patients will experience some swelling and bruising around the hernia(s) such as the bellybutton, groins, or old incisions.  Ice packs or heating pads (30-60 minutes up to 6 times a day) will help. Use ice for the first few days to help decrease swelling and bruising, then switch to heat to help relax tight/sore spots and speed recovery.  Some people prefer  to use ice alone, heat alone, alternating between ice & heat.  Experiment to what works for you.  Swelling and bruising can take several weeks to resolve.   c. It is helpful to take an over-the-counter pain medication regularly for the first few weeks.  Choose one of the following that works best for you: i. Naproxen (Aleve, etc)  Two 220mg  tabs twice a day ii. Ibuprofen (Advil, etc) Three 200mg  tabs four times a day (every meal & bedtime) iii. Acetaminophen (Tylenol, etc) 325-650mg  four times a day (every meal & bedtime) d. A  prescription for pain medication should be given to you upon discharge.  Take your pain medication as prescribed.  i. If you are having problems/concerns with the prescription medicine (does not control pain, nausea, vomiting, rash, itching, etc), please call us [phone] to see if we need to switch you to a different pain medicine that will work better for you and/or control your side effect better. ii. If you need a refill on your pain medication, please contact your pharmacy.  They will contact our office to request authorization. Prescriptions will not be filled after 5 pm or on week-ends. 4. Avoid getting constipated.  Between the surgery and the pain medications, it is common to experience some constipation.  Increasing fluid intake and taking a fiber supplement (such as Metamucil, Citrucel, FiberCon, MiraLax, etc) 1-2 times a day regularly will usually help prevent this problem from occurring.  A mild laxative (prune juice, Milk of Magnesia, MiraLax, etc) should be taken according to package directions if there are no bowel movements after 48 hours.   5. Wash / shower every day.  You may shower over the dressings as they are waterproof.   6. Remove your waterproof bandages 5 days after surgery.  You may leave the incision open to air.  You may replace a dressing/Band-Aid to cover the incision for comfort if you wish.  Continue to shower over incision(s) after the  dressing is off.    7. ACTIVITIES as tolerated:   a. You may resume regular (light) daily activities beginning the next day-such as daily self-care, walking, climbing stairs-gradually increasing activities as tolerated.  If you can walk 30 minutes without difficulty, it is safe to try more intense activity such as jogging, treadmill, bicycling, low-impact aerobics, swimming, etc. b. Save the most intensive and strenuous activity for last such as sit-ups, heavy lifting, contact sports, etc  Refrain from any heavy lifting or straining until you are off narcotics for pain control.   c. DO NOT PUSH THROUGH PAIN.  Let pain be your guide: If it hurts to do something, don't do it.  Pain is your body warning you to avoid that activity for another week until the pain goes down. d. You may drive when you are no longer taking prescription pain medication, you can comfortably wear a seatbelt, and you can safely maneuver your car and apply brakes. e. Bonita Quin may have sexual intercourse when it is comfortable.  8. FOLLOW UP in our office a. Please call [facility] at [phone]  to set up an appointment to see your surgeon in the office for a follow-up appointment approximately 2-3 weeks after your surgery. b. Make sure that you call for this appointment the day you arrive home to insure a convenient appointment time. 9.  IF YOU HAVE DISABILITY OR FAMILY LEAVE FORMS, BRING THEM TO THE OFFICE FOR PROCESSING.  DO NOT GIVE THEM TO YOUR DOCTOR.  WHEN TO CALL US [phone]: 1. Poor pain control 2. Reactions / problems with new medications (rash/itching, nausea, etc)  3. Fever over 101.5 F (38.5 C) 4. Inability to urinate 5. Nausea and/or vomiting 6. Worsening swelling or bruising 7. Continued bleeding from incision. 8. Increased pain, redness, or drainage from the incision   The clinic staff is available to answer your questions during regular business hours (8:30am-5pm).  Please don't hesitate to call and ask to  speak to one of our nurses for clinical concerns.   If you have a medical emergency, go to the nearest emergency room or call 911.  [facility] is always on call at the hospitals [facility]  [facility] [place] [address], [address], [place], [place]  [id number] ?  [address] [address], [place], [place]   [id number] MAIN: [phone] ? TOLL FREE: [phone] ? FAX: [phone] [email or web address]  GETTING TO GOOD BOWEL HEALTH. Irregular bowel habits such as constipation and diarrhea can lead to many problems over time.  Having one soft bowel movement a day is the most important way to prevent further problems.  The anorectal canal is designed to handle stretching and feces to safely manage our ability to get rid of solid waste (feces, poop, stool) out of our body.  BUT, hard constipated stools can act like ripping concrete bricks and diarrhea can be a burning fire to this very sensitive area of our body, causing inflamed hemorrhoids, anal fissures, increasing risk is perirectal abscesses, abdominal pain/bloating, an making irritable bowel worse.     The goal: ONE SOFT BOWEL MOVEMENT A DAY!  To have soft, regular bowel movements:    Drink at least 8 tall glasses of water a day.     Take plenty of fiber.  Fiber is the undigested part of plant food that passes into the colon, acting s "natures broom" to encourage bowel motility and movement.  Fiber can absorb and hold large amounts of water. This results in a larger, bulkier stool, which is soft and easier to pass. Work gradually over several weeks up to 6 servings a day of fiber (25g a day even more if needed) in the form of: o Vegetables -- Root (potatoes, carrots, turnips), leafy green (lettuce, salad greens, celery, spinach), or cooked high residue (cabbage, broccoli, etc) o Fruit -- Fresh (unpeeled skin & pulp), Dried (prunes, apricots, cherries, etc ),  or stewed ( applesauce)  o Whole grain  breads, pasta, etc (whole wheat)  o Bran cereals    Bulking Agents -- This type of water-retaining fiber generally is easily obtained each day by one of the following:  o Psyllium bran -- The psyllium plant is remarkable because its ground seeds can retain so much water. This product is available as Metamucil, Konsyl, Effersyllium, Per Diem Fiber, or the less expensive generic preparation in drug and health food stores. Although labeled a laxative, it really is not a laxative.  o Methylcellulose -- This is another fiber derived from wood which also retains water. It is available as Citrucel. o Polyethylene  Glycol - and "artificial" fiber commonly called Miralax or Glycolax.  It is helpful for people with gassy or bloated feelings with regular fiber o Flax Seed - a less gassy fiber than psyllium   No reading or other relaxing activity while on the toilet. If bowel movements take longer than 5 minutes, you are too constipated   AVOID CONSTIPATION.  High fiber and water intake usually takes care of this.  Sometimes a laxative is needed to stimulate more frequent bowel movements, but    Laxatives are not a good long-term solution as it can wear the colon out. o Osmotics (Milk of Magnesia, Fleets phosphosoda, Magnesium citrate, MiraLax, GoLytely) are safer than  o Stimulants (Senokot, Castor Oil, Dulcolax, Ex Lax)    o Do not take laxatives for more than 7days in a row.    IF SEVERELY CONSTIPATED, try a Bowel Retraining Program: o Do not use laxatives.  o Eat a diet high in roughage, such as bran cereals and leafy vegetables.  o Drink six (6) ounces of prune or apricot juice each morning.  o Eat two (2) large servings of stewed fruit each day.  o Take one (1) heaping tablespoon of a psyllium-based bulking agent twice a day. Use sugar-free sweetener when possible to avoid excessive calories.  o Eat a normal breakfast.  o Set aside 15 minutes after breakfast to sit on the toilet, but do not strain to  have a bowel movement.  o If you do not have a bowel movement by the third day, use an enema and repeat the above steps.    Controlling diarrhea o Switch to liquids and simpler foods for a few days to avoid stressing your intestines further. o Avoid dairy products (especially milk & ice cream) for a short time.  The intestines often can lose the ability to digest lactose when stressed. o Avoid foods that cause gassiness or bloating.  Typical foods include beans and other legumes, cabbage, broccoli, and dairy foods.  Every person has some sensitivity to other foods, so listen to our body and avoid those foods that trigger problems for you. o Adding fiber (Citrucel, Metamucil, psyllium, Miralax) gradually can help thicken stools by absorbing excess fluid and retrain the intestines to act more normally.  Slowly increase the dose over a few weeks.  Too much fiber too soon can backfire and cause cramping & bloating. o Probiotics (such as active yogurt, Align, etc) may help repopulate the intestines and colon with normal bacteria and calm down a sensitive digestive tract.  Most studies show it to be of mild help, though, and such products can be costly. o Medicines:   Bismuth subsalicylate (ex. Kayopectate, Pepto Bismol) every 30 minutes for up to 6 doses can help control diarrhea.  Avoid if pregnant.   Loperamide (Immodium) can slow down diarrhea.  Start with two tablets (4mg  total) first and then try one tablet every 6 hours.  Avoid if you are having fevers or severe pain.  If you are not better or start feeling worse, stop all medicines and call your doctor for advice o Call your doctor if you are getting worse or not better.  Sometimes further testing (cultures, endoscopy, X-ray studies, bloodwork, etc) may be needed to help diagnose and treat the cause of the diarrhea. o

## 2014-01-30 NOTE — Progress Notes (Signed)
Subjective:     Patient ID: Megan FloroKathleen Pair, female   DOB: 09/22/1945, 68 y.o.   MRN: [id number]  HPI  Note: Portions of this report may have been transcribed using voice recognition software. Every effort was made to ensure accuracy; however, inadvertent computerized transcription errors may be present.   Any transcriptional errors that result from this process are unintentional.            Megan Glover  09/22/1945 [id number]  Patient Care Team: Lenora Boysobert L Fried, MD as PCP - General (Family Medicine) Betty G SwazilandJordan, MD as Referring Physician (Family Medicine)  This patient is a 68 y.o.female who presents today for surgical evaluation at the request of Dr. SwazilandJordan.   Reason for visit: Right inguinal hernia  Pleasant female with numerous health issues including fibromyalgia and IBS with constipation.  She has had some right groin pain over the past 2 years.  Became more intense.  She was concern.  Underwent CAT scan evaluation concerning for right inguinal hernia.  A surgical consultation requested.  She struggled with chronic constipation felt to be IBS related.  Has not had much help with fiber bowel regimen.  Uses prune juice to have multiple bowel movements in the AM.  She gets up and walks rather regularly but has to stop after 15 minutes secondary to the groin pain.  She has never had any abdominal hernia surgery.  Has a labile blood pressure.  History of diverticulosis.  Claims to have had at least 10 attacks of diverticulitis in the past, none in the past few years.  Followed by [facility] gastro[facility]ology.  Benign polyp removed in 2012 on last colonoscopy according to patient.  Records not available.  No history of MRSA.    Patient Active Problem List   Diagnosis Date Noted  . Abdominal pain, acute, epigastric 09/12/2013  . Hypertensive urgency 09/12/2013  . Fibromyalgia   . IBS (irritable bowel syndrome)   . Lateral epicondylitis of right elbow 04/12/2013    Past Medical  History  Diagnosis Date  . Hypertension   . Thyroid disease   . History of diverticulosis   . Fibromyalgia   . Hypothyroidism   . IBS (irritable bowel syndrome)     Past Surgical History  Procedure Laterality Date  . Dilation and curettage of uterus      History   Social History  . Marital Status: Married    Spouse Name: N/A    Number of Children: N/A  . Years of Education: N/A   Occupational History  . Not on file.   Social History Main Topics  . Smoking status: Never Smoker   . Smokeless tobacco: Never Used  . Alcohol Use: No  . Drug Use: No  . Sexual Activity: Not on file   Other Topics Concern  . Not on file   Social History Narrative  . No narrative on file    Family History  Problem Relation Age of Onset  . Breast cancer Mother     breast CA  . Diabetes Father   . Hypertension Father   . Colon cancer      Current Outpatient Prescriptions  Medication Sig Dispense Refill  . acetaminophen (TYLENOL) 500 MG tablet Take 1,000 mg by mouth daily as needed for moderate pain.      Marland Kitchen. albuterol (PROVENTIL HFA;VENTOLIN HFA) 108 (90 BASE) MCG/ACT inhaler Inhale 2 puffs into the lungs every 8 (eight) hours as needed for wheezing or shortness of breath.      .Marland Kitchen  ALPRAZolam (XANAX) 0.25 MG tablet Take 0.25-0.75 mg by mouth See admin instructions. Patient can take 1-3 tablets by mouth at bedtime as needed for sleep.      . B Complex-C (B-COMPLEX WITH VITAMIN C) tablet Take 1 tablet by mouth daily.      . Biotin (PA BIOTIN) 1000 MCG tablet Take 1,000 mcg by mouth daily.      . busPIRone (BUSPAR) 15 MG tablet Take 15 mg by mouth 3 (three) times daily.      . carvedilol (COREG) 12.5 MG tablet Take 12.5 mg by mouth 2 (two) times daily with a meal.      . cholecalciferol (VITAMIN D) 1000 UNITS tablet Take 2,000 Units by mouth daily.      . citalopram (CELEXA) 10 MG tablet Take 10 mg by mouth daily.      . cyclobenzaprine (FLEXERIL) 10 MG tablet Take 10 mg by mouth daily as  needed for muscle spasms.       . hydrALAZINE (APRESOLINE) 10 MG tablet Take 10 mg by mouth 3 (three) times daily.      Marland Kitchen. levothyroxine (SYNTHROID, LEVOTHROID) 50 MCG tablet Take 50 mcg by mouth daily before breakfast.      . losartan (COZAAR) 100 MG tablet Take 100 mg by mouth daily.      . magnesium oxide (MAG-OX) 400 MG tablet Take 400 mg by mouth daily.      . Multiple Vitamins-Minerals (ZINC PO) Take 1 capsule by mouth daily.      Marland Kitchen. OVER THE COUNTER MEDICATION Apply 2 drops to eye at bedtime as needed (for itchy dry eyes.).      Marland Kitchen. propranolol (INNOPRAN XL) 120 MG 24 hr capsule Take 120 mg by mouth at bedtime.      . SPIRONOLACTONE PO Take by mouth.      . vitamin E 400 UNIT capsule Take 400 Units by mouth daily.       No current facility-administered medications for this visit.     Allergies  Allergen Reactions  . Clindamycin/Lincomycin     CDIF  . Demerol [Meperidine] Nausea And Vomiting, Other (See Comments) and Hypertension    Severe headache  . Erythromycin Diarrhea  . Sulfa Antibiotics Other (See Comments)    Anal itching    BP 146/86  Pulse 86  Temp(Src) 98.1 F (36.7 C)  Ht 5\' 7"  (1.702 m)  Wt 160 lb (72.576 kg)  BMI 25.05 kg/m2  No results found.   Review of Systems  Constitutional: Negative for fever, chills, diaphoresis, appetite change and fatigue.  HENT: Positive for hearing loss. Negative for ear discharge, ear pain, sore throat and trouble swallowing.   Eyes: Negative for photophobia, discharge and visual disturbance.  Respiratory: Negative for cough, choking, chest tightness and shortness of breath.   Cardiovascular: Negative for chest pain and palpitations.  Gastrointestinal: Positive for abdominal pain, constipation and anal bleeding. Negative for nausea, vomiting, diarrhea and rectal pain.  Endocrine: Negative for cold intolerance and heat intolerance.  Genitourinary: Negative for dysuria, frequency and difficulty urinating.  Musculoskeletal:  Negative for gait problem, myalgias and neck pain.  Skin: Negative for color change, pallor and rash.  Allergic/Immunologic: Negative for environmental allergies, food allergies and immunocompromised state.  Neurological: Negative for dizziness, speech difficulty, weakness and numbness.  Hematological: Negative for adenopathy.  Psychiatric/Behavioral: Negative for confusion and agitation. The patient is not nervous/anxious.        Objective:   Physical Exam  Constitutional: She is oriented to  person, place, and time. She appears well-developed and well-nourished. No distress.  HENT:  Head: Normocephalic.  Mouth/Throat: Oropharynx is clear and moist. No oropharyngeal exudate.  Eyes: Conjunctivae and EOM are normal. Pupils are equal, round, and reactive to light. No scleral icterus.  Neck: Normal range of motion. Neck supple. No tracheal deviation present.  Cardiovascular: Normal rate, regular rhythm and intact distal pulses.   Pulmonary/Chest: Effort normal and breath sounds normal. No stridor. No respiratory distress. She exhibits no tenderness.  Abdominal: Soft. She exhibits no distension and no mass. There is no tenderness. A hernia is present. Hernia confirmed positive in the right inguinal area.    Genitourinary: No vaginal discharge found.  Musculoskeletal: Normal range of motion. She exhibits no tenderness.       Right elbow: She exhibits normal range of motion.       Left elbow: She exhibits normal range of motion.       Right wrist: She exhibits normal range of motion.       Left wrist: She exhibits normal range of motion.       Right hand: Normal strength noted.       Left hand: Normal strength noted.  Lymphadenopathy:       Head (right side): No posterior auricular adenopathy present.       Head (left side): No posterior auricular adenopathy present.    She has no cervical adenopathy.    She has no axillary adenopathy.       Right: No inguinal adenopathy present.        Left: No inguinal adenopathy present.  Neurological: She is alert and oriented to person, place, and time. No cranial nerve deficit. She exhibits normal muscle tone. Coordination normal.  Skin: Skin is warm and dry. No rash noted. She is not diaphoretic. No erythema.  Psychiatric: Her speech is normal and behavior is normal. Judgment and thought content normal. Her mood appears anxious.  Apologetic & anxious at first but laughing & in good spirits at the end       Assessment:     Incarcerated right inguinal hernia.  Possible left Inguinal hernia by exam and CT scan as well.     Plan:     I believe she would benefit from repair of the incarcerated hernia.  Laparoscopic approach.  That allowed me to evaluate and treat a left inguinal hernia if found as well.  She wishes to proceed after she finishes some work on closing on a house later this month.  Therefore plan in July.   The anatomy & physiology of the abdominal wall and pelvic floor was discussed.  The pathophysiology of hernias in the inguinal and pelvic region was discussed.  Natural history risks such as progressive enlargement, pain, incarceration & strangulation was discussed.   Contributors to complications such as smoking, obesity, diabetes, prior surgery, etc were discussed.    I feel the risks of no intervention will lead to serious problems that outweigh the operative risks; therefore, I recommended surgery to reduce and repair the hernia.  I explained laparoscopic techniques with possible need for an open approach.  I noted usual use of mesh to patch and/or buttress hernia repair  Risks such as bleeding, infection, abscess, need for further treatment, heart attack, death, and other risks were discussed.  I noted a good likelihood this will help address the problem.   Goals of post-operative recovery were discussed as well.  Possibility that this will not correct all symptoms was  explained.  I stressed the importance of low-impact  activity, aggressive pain control, avoiding constipation, & not pushing through pain to minimize risk of post-operative chronic pain or injury. Possibility of reherniation was discussed.  We will work to minimize complications.     An educational handout further explaining the pathology & treatment options was given as well.  Questions were answered.  The patient expresses understanding & wishes to proceed with surgery.

## 2014-02-11 ENCOUNTER — Ambulatory Visit (INDEPENDENT_AMBULATORY_CARE_PROVIDER_SITE_OTHER): Payer: PRIVATE HEALTH INSURANCE | Admitting: Surgery

## 2014-02-20 ENCOUNTER — Emergency Department (HOSPITAL_COMMUNITY): Payer: Medicare Other

## 2014-02-20 ENCOUNTER — Encounter (HOSPITAL_COMMUNITY): Payer: Self-pay | Admitting: Emergency Medicine

## 2014-02-20 ENCOUNTER — Inpatient Hospital Stay (HOSPITAL_COMMUNITY)
Admission: EM | Admit: 2014-02-20 | Discharge: 2014-02-24 | DRG: 481 | Disposition: A | Discharge status: Skilled Nursing Facility | Payer: Medicare Other | Attending: Internal Medicine | Admitting: Internal Medicine

## 2014-02-20 DIAGNOSIS — Z8249 Family history of ischemic heart disease and other diseases of the circulatory system: Secondary | ICD-10-CM

## 2014-02-20 DIAGNOSIS — S72143A Displaced intertrochanteric fracture of unspecified femur, initial encounter for closed fracture: Principal | ICD-10-CM

## 2014-02-20 DIAGNOSIS — T4275XA Adverse effect of unspecified antiepileptic and sedative-hypnotic drugs, initial encounter: Secondary | ICD-10-CM | POA: Diagnosis not present

## 2014-02-20 DIAGNOSIS — Z888 Allergy status to other drugs, medicaments and biological substances status: Secondary | ICD-10-CM

## 2014-02-20 DIAGNOSIS — K581 Irritable bowel syndrome with constipation: Secondary | ICD-10-CM

## 2014-02-20 DIAGNOSIS — Z79899 Other long term (current) drug therapy: Secondary | ICD-10-CM

## 2014-02-20 DIAGNOSIS — F329 Major depressive disorder, single episode, unspecified: Secondary | ICD-10-CM | POA: Diagnosis present

## 2014-02-20 DIAGNOSIS — Z833 Family history of diabetes mellitus: Secondary | ICD-10-CM

## 2014-02-20 DIAGNOSIS — M797 Fibromyalgia: Secondary | ICD-10-CM

## 2014-02-20 DIAGNOSIS — D649 Anemia, unspecified: Secondary | ICD-10-CM | POA: Diagnosis not present

## 2014-02-20 DIAGNOSIS — Z803 Family history of malignant neoplasm of breast: Secondary | ICD-10-CM

## 2014-02-20 DIAGNOSIS — K5900 Constipation, unspecified: Secondary | ICD-10-CM | POA: Diagnosis present

## 2014-02-20 DIAGNOSIS — R110 Nausea: Secondary | ICD-10-CM | POA: Diagnosis not present

## 2014-02-20 DIAGNOSIS — IMO0001 Reserved for inherently not codable concepts without codable children: Secondary | ICD-10-CM | POA: Diagnosis present

## 2014-02-20 DIAGNOSIS — K436 Other and unspecified ventral hernia with obstruction, without gangrene: Secondary | ICD-10-CM | POA: Diagnosis present

## 2014-02-20 DIAGNOSIS — S72001A Fracture of unspecified part of neck of right femur, initial encounter for closed fracture: Secondary | ICD-10-CM

## 2014-02-20 DIAGNOSIS — S72141A Displaced intertrochanteric fracture of right femur, initial encounter for closed fracture: Secondary | ICD-10-CM

## 2014-02-20 DIAGNOSIS — Z7901 Long term (current) use of anticoagulants: Secondary | ICD-10-CM

## 2014-02-20 DIAGNOSIS — F3289 Other specified depressive episodes: Secondary | ICD-10-CM | POA: Diagnosis present

## 2014-02-20 DIAGNOSIS — F411 Generalized anxiety disorder: Secondary | ICD-10-CM | POA: Diagnosis present

## 2014-02-20 DIAGNOSIS — Z881 Allergy status to other antibiotic agents status: Secondary | ICD-10-CM

## 2014-02-20 DIAGNOSIS — E039 Hypothyroidism, unspecified: Secondary | ICD-10-CM | POA: Diagnosis present

## 2014-02-20 DIAGNOSIS — S72009A Fracture of unspecified part of neck of unspecified femur, initial encounter for closed fracture: Secondary | ICD-10-CM

## 2014-02-20 DIAGNOSIS — W010XXA Fall on same level from slipping, tripping and stumbling without subsequent striking against object, initial encounter: Secondary | ICD-10-CM | POA: Diagnosis present

## 2014-02-20 DIAGNOSIS — Z882 Allergy status to sulfonamides status: Secondary | ICD-10-CM

## 2014-02-20 DIAGNOSIS — Z800 Family history of malignant neoplasm of digestive organs: Secondary | ICD-10-CM

## 2014-02-20 DIAGNOSIS — K589 Irritable bowel syndrome without diarrhea: Secondary | ICD-10-CM | POA: Diagnosis present

## 2014-02-20 DIAGNOSIS — I10 Essential (primary) hypertension: Secondary | ICD-10-CM

## 2014-02-20 HISTORY — DX: Enterocolitis due to Clostridium difficile, not specified as recurrent: A04.72

## 2014-02-20 LAB — CBC WITH DIFFERENTIAL/PLATELET
BASOS PCT: 0 % (ref 0–1)
Basophils Absolute: 0 10*3/uL (ref 0.0–0.1)
EOS ABS: 0 10*3/uL (ref 0.0–0.7)
Eosinophils Relative: 0 % (ref 0–5)
HCT: 35.5 % — ABNORMAL LOW (ref 36.0–46.0)
Hemoglobin: 12.4 g/dL (ref 12.0–15.0)
Lymphocytes Relative: 9 % — ABNORMAL LOW (ref 12–46)
Lymphs Abs: 1.1 10*3/uL (ref 0.7–4.0)
MCH: 31.6 pg (ref 26.0–34.0)
MCHC: 34.9 g/dL (ref 30.0–36.0)
MCV: 90.6 fL (ref 78.0–100.0)
Monocytes Absolute: 0.9 10*3/uL (ref 0.1–1.0)
Monocytes Relative: 7 % (ref 3–12)
Neutro Abs: 10.3 10*3/uL — ABNORMAL HIGH (ref 1.7–7.7)
Neutrophils Relative %: 84 % — ABNORMAL HIGH (ref 43–77)
PLATELETS: 202 10*3/uL (ref 150–400)
RBC: 3.92 MIL/uL (ref 3.87–5.11)
RDW: 12.4 % (ref 11.5–15.5)
WBC: 12.5 10*3/uL — ABNORMAL HIGH (ref 4.0–10.5)

## 2014-02-20 LAB — BASIC METABOLIC PANEL
Anion gap: 12 (ref 5–15)
BUN: 15 mg/dL (ref 6–23)
CO2: 25 mEq/L (ref 19–32)
Calcium: 9.5 mg/dL (ref 8.4–10.5)
Chloride: 99 mEq/L (ref 96–112)
Creatinine, Ser: 0.74 mg/dL (ref 0.50–1.10)
GFR calc Af Amer: >90 mL/min (ref >90)
GFR, EST NON AFRICAN AMERICAN: 85 mL/min — AB (ref >90)
Glucose, Bld: 97 mg/dL (ref 70–99)
Potassium: 4.5 mEq/L (ref 3.7–5.3)
SODIUM: 136 meq/L — AB (ref 137–147)

## 2014-02-20 MED ORDER — FERROUS SULFATE 325 (65 FE) MG PO TABS
325.0000 mg | ORAL_TABLET | Freq: Three times a day (TID) | ORAL | Status: DC
  Filled 2014-02-20 (×10): qty 1

## 2014-02-20 MED ORDER — MAGNESIUM OXIDE 400 (241.3 MG) MG PO TABS
400.0000 mg | ORAL_TABLET | Freq: Every day | ORAL | Status: DC
  Administered 2014-02-21 – 2014-02-24 (×4): 400 mg via ORAL
  Filled 2014-02-20 (×4): qty 1

## 2014-02-20 MED ORDER — MAGNESIUM OXIDE 400 MG PO TABS: 400.0000 mg | ORAL_TABLET | Freq: Every day | ORAL | Status: DC

## 2014-02-20 MED ORDER — ALBUTEROL SULFATE (2.5 MG/3ML) 0.083% IN NEBU
2.5000 mg | INHALATION_SOLUTION | Freq: Three times a day (TID) | RESPIRATORY_TRACT | Status: DC | PRN
  Administered 2014-02-22: 2.5 mg via RESPIRATORY_TRACT
  Filled 2014-02-20 (×2): qty 3

## 2014-02-20 MED ORDER — BUSPIRONE HCL 15 MG PO TABS
15.0000 mg | ORAL_TABLET | Freq: Three times a day (TID) | ORAL | Status: DC
  Administered 2014-02-21 – 2014-02-24 (×2): 15 mg via ORAL
  Filled 2014-02-20 (×13): qty 1

## 2014-02-20 MED ORDER — DOCUSATE SODIUM 100 MG PO CAPS
100.0000 mg | ORAL_CAPSULE | Freq: Two times a day (BID) | ORAL | Status: DC
  Administered 2014-02-20 – 2014-02-23 (×6): 100 mg via ORAL
  Filled 2014-02-20 (×10): qty 1

## 2014-02-20 MED ORDER — FENTANYL CITRATE 0.05 MG/ML IJ SOLN
50.0000 ug | Freq: Once | INTRAMUSCULAR | Status: AC
  Administered 2014-02-20: 50 ug via INTRAVENOUS
  Filled 2014-02-20: qty 2

## 2014-02-20 MED ORDER — NEBIVOLOL HCL 10 MG PO TABS
20.0000 mg | ORAL_TABLET | Freq: Every day | ORAL | Status: DC
  Administered 2014-02-21 – 2014-02-22 (×2): 20 mg via ORAL
  Filled 2014-02-20 (×2): qty 2

## 2014-02-20 MED ORDER — HYDRALAZINE HCL 10 MG PO TABS
10.0000 mg | ORAL_TABLET | Freq: Three times a day (TID) | ORAL | Status: DC
  Administered 2014-02-20 – 2014-02-22 (×4): 10 mg via ORAL
  Filled 2014-02-20 (×7): qty 1

## 2014-02-20 MED ORDER — OXYCODONE HCL 5 MG PO TABS
5.0000 mg | ORAL_TABLET | ORAL | Status: DC | PRN
  Administered 2014-02-21: 10 mg via ORAL
  Administered 2014-02-22: 5 mg via ORAL
  Administered 2014-02-22: 10 mg via ORAL
  Filled 2014-02-20 (×3): qty 2
  Filled 2014-02-20: qty 1

## 2014-02-20 MED ORDER — CYCLOBENZAPRINE HCL 10 MG PO TABS
10.0000 mg | ORAL_TABLET | Freq: Every day | ORAL | Status: DC | PRN
  Administered 2014-02-20 – 2014-02-23 (×3): 10 mg via ORAL
  Filled 2014-02-20 (×3): qty 1

## 2014-02-20 MED ORDER — LOSARTAN POTASSIUM 50 MG PO TABS: 100.0000 mg | ORAL_TABLET | Freq: Every day | ORAL | Status: DC

## 2014-02-20 MED ORDER — LOSARTAN POTASSIUM 50 MG PO TABS
100.0000 mg | ORAL_TABLET | Freq: Every day | ORAL | Status: DC
  Administered 2014-02-22: 100 mg via ORAL
  Filled 2014-02-20 (×2): qty 2

## 2014-02-20 MED ORDER — ONDANSETRON HCL 4 MG/2ML IJ SOLN
4.0000 mg | Freq: Once | INTRAMUSCULAR | Status: AC
Start: 2014-02-20 — End: 2014-02-20
  Administered 2014-02-20: 4 mg via INTRAVENOUS
  Filled 2014-02-20: qty 2

## 2014-02-20 MED ORDER — CEFAZOLIN SODIUM-DEXTROSE 2-3 GM-% IV SOLR
2.0000 g | INTRAVENOUS | Status: AC
Start: 2014-02-21 — End: 2014-02-21
  Administered 2014-02-21: 2 g via INTRAVENOUS
  Filled 2014-02-20: qty 50

## 2014-02-20 MED ORDER — ALBUTEROL SULFATE HFA 108 (90 BASE) MCG/ACT IN AERS: 2.0000 | INHALATION_SPRAY | Freq: Three times a day (TID) | RESPIRATORY_TRACT | Status: DC | PRN

## 2014-02-20 MED ORDER — CLONAZEPAM 0.5 MG PO TABS
0.5000 mg | ORAL_TABLET | Freq: Two times a day (BID) | ORAL | Status: DC | PRN
  Administered 2014-02-20 – 2014-02-24 (×6): 0.5 mg via ORAL
  Filled 2014-02-20 (×6): qty 1

## 2014-02-20 MED ORDER — ALPRAZOLAM 0.25 MG PO TABS: 0.2500 mg | ORAL_TABLET | ORAL | Status: DC

## 2014-02-20 MED ORDER — VITAMIN D3 25 MCG (1000 UNIT) PO TABS
2000.0000 [IU] | ORAL_TABLET | Freq: Every day | ORAL | Status: DC
  Administered 2014-02-21 – 2014-02-24 (×4): 2000 [IU] via ORAL
  Filled 2014-02-20 (×4): qty 2

## 2014-02-20 MED ORDER — BISACODYL 5 MG PO TBEC
5.0000 mg | DELAYED_RELEASE_TABLET | Freq: Every day | ORAL | Status: DC | PRN
Start: 2014-02-20 — End: 2014-02-21

## 2014-02-20 MED ORDER — HYDROCODONE-ACETAMINOPHEN 5-325 MG PO TABS
1.0000 | ORAL_TABLET | Freq: Four times a day (QID) | ORAL | Status: DC | PRN
  Administered 2014-02-21 (×2): 1 via ORAL
  Administered 2014-02-21: 2 via ORAL
  Filled 2014-02-20: qty 1
  Filled 2014-02-20 (×2): qty 2

## 2014-02-20 MED ORDER — MORPHINE SULFATE 2 MG/ML IJ SOLN
0.5000 mg | INTRAMUSCULAR | Status: DC | PRN
  Administered 2014-02-20 – 2014-02-22 (×4): 0.5 mg via INTRAVENOUS
  Filled 2014-02-20 (×5): qty 1

## 2014-02-20 MED ORDER — SODIUM CHLORIDE 0.9 % IV SOLN
INTRAVENOUS | Status: DC
  Administered 2014-02-20: 22:00:00 via INTRAVENOUS

## 2014-02-20 NOTE — ED Notes (Signed)
Initial Contact - pt A+Ox4, reports tripping over a trailer hitch and falling.  Denies hitting head or LOC.  Pt c/o 5/10 pain to RLE, R hip and low back at this time.  +csm/+pulses.  Denies n/t to extremities.  +shortening/rotation noted.  Pt also with sm. Hematoma/abrasion to bilat shins where she hit the hitch.  Ice applied.  Pt denies dizziness, weakness or CP prior to fall or at this time.  Skin otherwise PWD.  Speaking full/clear sentences, rr even/un-lab.  NAD.

## 2014-02-20 NOTE — H&P (Signed)
[facility] History and Physical  Megan Glover AVW:[id number] DOB: 03/23/1946 DOA: 02/20/2014  Referring physician: Blake Divine, MD PCP: Lenora Boys, MD   Chief Complaint: Hip Fracture  HPI: Megan Glover is a 68 y.o. female fairly healthy female presents with a right hip fracture. Patient states that she was walking outside and tripped over the lip of her husbands trailer. Patient states that she did not hit her head. She has no LOC and no dizziness. She had no CP noted. Patient states there is no SOB noted. She denies having any prior falls. Patient now isnoted to have a externally rotated and shortened right hip.   Review of Systems:  Constitutional:  No weight loss, night sweats, Fevers, chills, fatigue.  HEENT:  No headaches, Difficulty swallowing,Tooth/dental problems,Sore throat,  Cardio-vascular:  No chest pain, Orthopnea, PND, swelling in lower extremities, anasarca, dizziness, palpitations  GI:  ++abdominal pain, nausea, vomiting, diarrhea, ++change in bowel habits Resp:  No shortness of breath with exertion or at rest. No excess mucus, no productive cough, No non-productive cough, No coughing up of blood  Skin:  no rash or lesions.  GU:  no dysuria, change in color of urine, no urgency or frequency. No flank pain.  Musculoskeletal:  Chronic muscle pain, hip pain  Psych:  No change in mood or affect. No depression or anxiety. No memory loss.   Past Medical History  Diagnosis Date  . Hypertension   . Thyroid disease   . History of diverticulosis   . Fibromyalgia   . Hypothyroidism   . IBS (irritable bowel syndrome)   . Clostridium difficile diarrhea    Past Surgical History  Procedure Laterality Date  . Dilation and curettage of uterus     Social History:  reports that she has never smoked. She has never used smokeless tobacco. She reports that she does not drink alcohol or use illicit drugs.  Allergies  Allergen Reactions  .  Clindamycin/Lincomycin     CDIF  . Demerol [Meperidine] Nausea And Vomiting, Other (See Comments) and Hypertension    Severe headache  . Erythromycin Diarrhea  . Hydrochlorothiazide Other (See Comments)    Weakness/malaise  . Spironolactone Other (See Comments)    Weakness/malaise  . Sulfa Antibiotics Other (See Comments)    Anal itching    Family History  Problem Relation Age of Onset  . Breast cancer Mother     breast CA  . Diabetes Father   . Hypertension Father   . Colon cancer       Prior to Admission medications   Medication Sig Start Date End Date Taking? Authorizing Provider  acetaminophen (TYLENOL) 500 MG tablet Take 1,000 mg by mouth daily as needed for moderate pain.   Yes Historical Provider, MD  albuterol (PROVENTIL HFA;VENTOLIN HFA) 108 (90 BASE) MCG/ACT inhaler Inhale 2 puffs into the lungs every 8 (eight) hours as needed for wheezing or shortness of breath.   Yes Historical Provider, MD  ALPRAZolam (XANAX) 0.25 MG tablet Take 0.25-0.75 mg by mouth See admin instructions. Patient can take 1-3 tablets by mouth at bedtime as needed for sleep.   Yes Historical Provider, MD  B Complex-C (B-COMPLEX WITH VITAMIN C) tablet Take 1 tablet by mouth daily.   Yes Historical Provider, MD  Biotin (PA BIOTIN) 1000 MCG tablet Take 1,000 mcg by mouth daily.   Yes Historical Provider, MD  busPIRone (BUSPAR) 15 MG tablet Take 15 mg by mouth 3 (three) times daily.   Yes Historical Provider, MD  cholecalciferol (VITAMIN D) 1000 UNITS tablet Take 2,000 Units by mouth daily.   Yes Historical Provider, MD  clonazePAM (KLONOPIN) 0.5 MG tablet Take 0.5 mg by mouth 2 (two) times daily as needed for anxiety (anxiety).   Yes Historical Provider, MD  cyclobenzaprine (FLEXERIL) 10 MG tablet Take 10 mg by mouth daily as needed for muscle spasms.    Yes Historical Provider, MD  hydrALAZINE (APRESOLINE) 10 MG tablet Take 10 mg by mouth 3 (three) times daily.   Yes Historical Provider, MD  losartan  (COZAAR) 100 MG tablet Take 100 mg by mouth daily.   Yes Historical Provider, MD  magnesium oxide (MAG-OX) 400 MG tablet Take 400 mg by mouth daily.   Yes Historical Provider, MD  Multiple Vitamins-Minerals (ZINC PO) Take 1 capsule by mouth daily.   Yes Historical Provider, MD  nebivolol (BYSTOLIC) 10 MG tablet Take 20 mg by mouth daily. Two tablets   Yes Historical Provider, MD  OVER THE COUNTER MEDICATION Apply 2 drops to eye at bedtime as needed (for itchy dry eyes.).    Historical Provider, MD   Physical Exam: Filed Vitals:   02/20/14 1900  BP: 149/54  Pulse: 61  Temp: 97.9 F (36.6 C)  Resp: 16    BP 149/54  Pulse 61  Temp(Src) 97.9 F (36.6 C) (Oral)  Resp 16  SpO2 96%  General:  Appears calm and comfortable Eyes: PERRL, normal lids, irises & conjunctiva ENT: grossly normal hearing, lips & tongue Neck: no LAD, masses or thyromegaly Cardiovascular: RRR, no m/r/g. No LE edema. Respiratory: CTA bilaterally, no w/r/r. Normal respiratory effort. Abdomen: soft, ntnd Skin: no rash or induration seen on limited exam Musculoskeletal: Right LE shortened and externally rotated Psychiatric: grossly normal mood and affect, speech fluent and appropriate Neurologic: grossly non-focal.          Labs on Admission:  Basic Metabolic Panel:  Recent Labs Lab 02/20/14 1749  NA 136*  K 4.5  CL 99  CO2 25  GLUCOSE 97  BUN 15  CREATININE 0.74  CALCIUM 9.5   Liver Function Tests: No results found for this basename: AST, ALT, ALKPHOS, BILITOT, PROT, ALBUMIN,  in the last 168 hours No results found for this basename: LIPASE, AMYLASE,  in the last 168 hours No results found for this basename: AMMONIA,  in the last 168 hours CBC:  Recent Labs Lab 02/20/14 1749  WBC 12.5*  NEUTROABS 10.3*  HGB 12.4  HCT 35.5*  MCV 90.6  PLT 202   Cardiac Enzymes: No results found for this basename: CKTOTAL, CKMB, CKMBINDEX, TROPONINI,  in the last 168 hours  BNP (last 3 results) No  results found for this basename: PROBNP,  in the last 8760 hours CBG: No results found for this basename: GLUCAP,  in the last 168 hours  Radiological Exams on Admission: Dg Hip Bilateral W/pelvis  02/20/2014   CLINICAL DATA:  Fall  EXAM: BILATERAL HIP WITH PELVIS - 4+ VIEW  COMPARISON:  None.  FINDINGS: Nondisplaced intertrochanteric fracture right femur. Right hip joint is normal.  Negative for left hip fracture. Benign sclerotic lesion in the intertrochanteric region. Left hip joint is normal. No pelvic fracture  IMPRESSION: Nondisplaced intertrochanteric fracture right femur.   Electronically Signed   By: Marlan Palauharles  Clark M.D.   On: 02/20/2014 17:23   Dg Knee 1-2 Views Right  02/20/2014   CLINICAL DATA:  Fall, pain  EXAM: RIGHT KNEE - 1-2 VIEW  COMPARISON:  None.  FINDINGS: There is no  evidence of fracture, dislocation, or joint effusion. There is no evidence of arthropathy or other focal bone abnormality. Soft tissues are unremarkable.  IMPRESSION: Negative.   Electronically Signed   By: Marlan Palau M.D.   On: 02/20/2014 17:22     Assessment/Plan Active Problems:   Fibromyalgia   Closed right hip fracture   Hypertension   Hip fracture   1. Right hip Fracture -she will be transferred to [facility] for surgery in the morning -Spoke to Dr Alvester Morin at [facility] as accepting physician -adequate pain control -pre-op workup has been done  2. Hypertension -will continue with her home medications -currently pressure is controlled  3. IBS -cotninue with home medications  4. Fibromyalgia -on multiple pain meds -will observe    Code Status: Full Code (must indicate code status--if unknown or must be presumed, indicate so) Family Communication: Husband in room (indicate person spoken with, if applicable, with phone number if by telephone) Disposition Plan: home (indicate anticipated LOS)  Time spent:  [facility] Pager [phone]  **Disclaimer: This note may have  been dictated with voice recognition software. Similar sounding words can inadvertently be transcribed and this note may contain transcription errors which may not have been corrected upon publication of note.**

## 2014-02-20 NOTE — Progress Notes (Signed)
Orthopedic Tech Progress Note Patient Details:  Megan Glover 1945-12-09 [id number]  Overhead frame is applied to patient bed per nursing order.  Patient ID: Megan Glover, female   DOB: 1945-12-09, 68 y.o.   MRN: [id number]   Early CharsBaker,Garl Speigner Anthony 02/20/2014, 10:30 PM

## 2014-02-20 NOTE — ED Provider Notes (Signed)
Medical screening examination/treatment/procedure(s) were conducted as a shared visit with non-physician practitioner(s) and myself.  I personally evaluated the patient during the encounter.   EKG Interpretation None      68 yo female with mechanical fall.  She tripped over the tongue of a trailer.  Complains of right hip pain and right low back pain.  On exam, well appearing, nontoxic, not distressed, normal respiratory effort, normal perfusion, right leg is rotated, slightly shortened, NV intact.  Plain plain films.   Plain films show intertrochanteric hip fracture.  Plan admit.    Clinical Impression: 1. Intertrochanteric fracture of right hip, closed, initial encounter      Candyce ChurnJohn David Maylie Ashton III, MD 02/20/14 2223

## 2014-02-20 NOTE — ED Notes (Signed)
PER EMS - pt A+OX4, tripped and fell, c/o RLE pain. Family placed dressing to R knee area.  Denies hitting head or LOC.

## 2014-02-20 NOTE — ED Notes (Signed)
Bed: WA04 Expected date:  Expected time:  Means of arrival:  Comments: 

## 2014-02-20 NOTE — ED Notes (Signed)
Pt to radiology.

## 2014-02-20 NOTE — ED Provider Notes (Signed)
Medical screening examination/treatment/procedure(s) were conducted as a shared visit with non-physician practitioner(s) and myself.  I personally evaluated the patient during the encounter.   Please see my separate note.     Candyce ChurnJohn David Agamjot Kilgallon III, MD 02/20/14 2212

## 2014-02-20 NOTE — Consult Note (Signed)
ORTHOPAEDIC CONSULTATION  REQUESTING PHYSICIAN: Yevonne Pax, MD  Chief Complaint: Right hip pain  HPI: Megan Glover is a 68 y.o. female who complains of  right hip pain after a mechanical fall earlier today while watching her family unloading a truck. She tripped over part of the [employer] apparatus. She had acute onset severe pain in the right hip, unable to walk, denies any pre-existing symptoms. No loss of consciousness. Denies other other injuries except for some knee pain. She also has some back pain.  She does have a history of abdominal hernias, and apparently one is incarcerated, and she was planning on having surgery at the end of this month with Dr. Michaell Cowing.  Past Medical History  Diagnosis Date  . Hypertension   . Thyroid disease   . History of diverticulosis   . Fibromyalgia   . Hypothyroidism   . IBS (irritable bowel syndrome)   . Clostridium difficile diarrhea    Past Surgical History  Procedure Laterality Date  . Dilation and curettage of uterus     History   Social History  . Marital Status: Married    Spouse Name: N/A    Number of Children: N/A  . Years of Education: N/A   Social History Main Topics  . Smoking status: Never Smoker   . Smokeless tobacco: Never Used  . Alcohol Use: No  . Drug Use: No  . Sexual Activity: None   Other Topics Concern  . None   Social History Narrative  . None   Family History  Problem Relation Age of Onset  . Breast cancer Mother     breast CA  . Diabetes Father   . Hypertension Father   . Colon cancer     Allergies  Allergen Reactions  . Clindamycin/Lincomycin     CDIF  . Demerol [Meperidine] Nausea And Vomiting, Other (See Comments) and Hypertension    Severe headache  . Erythromycin Diarrhea  . Hydrochlorothiazide Other (See Comments)    Weakness/malaise  . Spironolactone Other (See Comments)    Weakness/malaise  . Sulfa Antibiotics Other (See Comments)    Anal itching   Prior to Admission  medications   Medication Sig Start Date End Date Taking? Authorizing Provider  acetaminophen (TYLENOL) 500 MG tablet Take 1,000 mg by mouth daily as needed for moderate pain.   Yes Historical Provider, MD  albuterol (PROVENTIL HFA;VENTOLIN HFA) 108 (90 BASE) MCG/ACT inhaler Inhale 2 puffs into the lungs every 8 (eight) hours as needed for wheezing or shortness of breath.   Yes Historical Provider, MD  ALPRAZolam (XANAX) 0.25 MG tablet Take 0.25-0.75 mg by mouth See admin instructions. Patient can take 1-3 tablets by mouth at bedtime as needed for sleep.   Yes Historical Provider, MD  B Complex-C (B-COMPLEX WITH VITAMIN C) tablet Take 1 tablet by mouth daily.   Yes Historical Provider, MD  Biotin (PA BIOTIN) 1000 MCG tablet Take 1,000 mcg by mouth daily.   Yes Historical Provider, MD  busPIRone (BUSPAR) 15 MG tablet Take 15 mg by mouth 3 (three) times daily.   Yes Historical Provider, MD  cholecalciferol (VITAMIN D) 1000 UNITS tablet Take 2,000 Units by mouth daily.   Yes Historical Provider, MD  clonazePAM (KLONOPIN) 0.5 MG tablet Take 0.5 mg by mouth 2 (two) times daily as needed for anxiety (anxiety).   Yes Historical Provider, MD  cyclobenzaprine (FLEXERIL) 10 MG tablet Take 10 mg by mouth daily as needed for muscle spasms.    Yes Historical Provider,  MD  hydrALAZINE (APRESOLINE) 10 MG tablet Take 10 mg by mouth 3 (three) times daily.   Yes Historical Provider, MD  losartan (COZAAR) 100 MG tablet Take 100 mg by mouth daily.   Yes Historical Provider, MD  magnesium oxide (MAG-OX) 400 MG tablet Take 400 mg by mouth daily.   Yes Historical Provider, MD  Multiple Vitamins-Minerals (ZINC PO) Take 1 capsule by mouth daily.   Yes Historical Provider, MD  nebivolol (BYSTOLIC) 10 MG tablet Take 20 mg by mouth daily. Two tablets   Yes Historical Provider, MD  OVER THE COUNTER MEDICATION Apply 2 drops to eye at bedtime as needed (for itchy dry eyes.).    Historical Provider, MD   Dg Hip Bilateral  W/pelvis  02/20/2014   CLINICAL DATA:  Fall  EXAM: BILATERAL HIP WITH PELVIS - 4+ VIEW  COMPARISON:  None.  FINDINGS: Nondisplaced intertrochanteric fracture right femur. Right hip joint is normal.  Negative for left hip fracture. Benign sclerotic lesion in the intertrochanteric region. Left hip joint is normal. No pelvic fracture  IMPRESSION: Nondisplaced intertrochanteric fracture right femur.   Electronically Signed   By: Marlan Palauharles  Clark M.D.   On: 02/20/2014 17:23   Dg Knee 1-2 Views Right  02/20/2014   CLINICAL DATA:  Fall, pain  EXAM: RIGHT KNEE - 1-2 VIEW  COMPARISON:  None.  FINDINGS: There is no evidence of fracture, dislocation, or joint effusion. There is no evidence of arthropathy or other focal bone abnormality. Soft tissues are unremarkable.  IMPRESSION: Negative.   Electronically Signed   By: Marlan Palauharles  Clark M.D.   On: 02/20/2014 17:22    Positive ROS: All other systems have been reviewed and were otherwise negative with the exception of those mentioned in the HPI and as above.  Physical Exam: General: Alert, no acute distress Cardiovascular: No pedal edema Respiratory: No cyanosis, no use of accessory musculature GI: No organomegaly, abdomen is soft and non-tender Skin: No lesions in the area of chief complaint, with the exception of bruising over the right hip and a small abrasion over the right proximal tibia. Neurologic: Sensation intact distally Psychiatric: Patient is competent for consent with normal mood and affect Lymphatic: No axillary or cervical lymphadenopathy  MUSCULOSKELETAL: Right hip has positive log roll, sensation intact distally, EHL and FHL are intact.  Assessment: Right intertrochanteric hip fracture, bilateral abdominal hernias, one may be incarcerated chronically.  Plan: This is an acute severe injury, teres risks of both morbidity and mortality, and I discussed the options with her and she has elected for surgical management of her right hip. We also have  risks because of her abdominal problems, and affect she can require increased narcotic utilization around the time of her surgery and healing from her hip fracture. We also may need to have a discussion with Dr. Michaell CowingGross regarding management of her abdomen if it becomes an issue. She is going to plan to postpone her hernia surgery as well.  We'll plan to go to surgery tomorrow for internal fixation of the right hip, the risks benefits and alternatives were discussed with the patient including but not limited to the risks of nonoperative treatment, versus surgical intervention including infection, bleeding, nerve injury, malunion, nonunion, the need for revision surgery, hardware prominence, hardware failure, the need for hardware removal, blood clots, cardiopulmonary complications, morbidity, mortality, among others, and they were willing to proceed.       Eulas PostLANDAU,Rea Reser P, MD Cell [phone]   02/20/2014 11:22 PM

## 2014-02-20 NOTE — ED Provider Notes (Signed)
CSN: [id number]     Arrival date & time 02/20/14  1536 History   First MD Initiated Contact with Patient 02/20/14 1551     Chief Complaint  Patient presents with  . Fall  . Leg Pain     (Consider location/radiation/quality/duration/timing/severity/associated sxs/prior Treatment) HPI Comments: Patient presents to the ED with a chief complaint of fall.  She states that she tripped over the tongue of a trailer while helping her family move today.  She complains of 6-9/10 pain in the right hip.  The pain radiates to the right LE.  She states that the pain is worsened with movement and palpation.  She denies hitting her head or LOC.  She is not on any blood thinners.  The history is provided by the patient. No language interpreter was used.    Past Medical History  Diagnosis Date  . Hypertension   . Thyroid disease   . History of diverticulosis   . Fibromyalgia   . Hypothyroidism   . IBS (irritable bowel syndrome)   . Clostridium difficile diarrhea    Past Surgical History  Procedure Laterality Date  . Dilation and curettage of uterus     Family History  Problem Relation Age of Onset  . Breast cancer Mother     breast CA  . Diabetes Father   . Hypertension Father   . Colon cancer     History  Substance Use Topics  . Smoking status: Never Smoker   . Smokeless tobacco: Never Used  . Alcohol Use: No   OB History   Grav Para Term Preterm Abortions TAB SAB Ect Mult Living                 Review of Systems  All other systems reviewed and are negative.     Allergies  Clindamycin/lincomycin; Demerol; Erythromycin; and Sulfa antibiotics  Home Medications   Prior to Admission medications   Medication Sig Start Date End Date Taking? Authorizing Provider  acetaminophen (TYLENOL) 500 MG tablet Take 1,000 mg by mouth daily as needed for moderate pain.    Historical Provider, MD  albuterol (PROVENTIL HFA;VENTOLIN HFA) 108 (90 BASE) MCG/ACT inhaler Inhale 2 puffs into the  lungs every 8 (eight) hours as needed for wheezing or shortness of breath.    Historical Provider, MD  ALPRAZolam Prudy Feeler) 0.25 MG tablet Take 0.25-0.75 mg by mouth See admin instructions. Patient can take 1-3 tablets by mouth at bedtime as needed for sleep.    Historical Provider, MD  B Complex-C (B-COMPLEX WITH VITAMIN C) tablet Take 1 tablet by mouth daily.    Historical Provider, MD  Biotin (PA BIOTIN) 1000 MCG tablet Take 1,000 mcg by mouth daily.    Historical Provider, MD  busPIRone (BUSPAR) 15 MG tablet Take 15 mg by mouth 3 (three) times daily.    Historical Provider, MD  carvedilol (COREG) 12.5 MG tablet Take 12.5 mg by mouth 2 (two) times daily with a meal.    Historical Provider, MD  cholecalciferol (VITAMIN D) 1000 UNITS tablet Take 2,000 Units by mouth daily.    Historical Provider, MD  citalopram (CELEXA) 10 MG tablet Take 10 mg by mouth daily.    Historical Provider, MD  cyclobenzaprine (FLEXERIL) 10 MG tablet Take 10 mg by mouth daily as needed for muscle spasms.     Historical Provider, MD  hydrALAZINE (APRESOLINE) 10 MG tablet Take 10 mg by mouth 3 (three) times daily.    Historical Provider, MD  levothyroxine (SYNTHROID, LEVOTHROID)  50 MCG tablet Take 50 mcg by mouth daily before breakfast.    Historical Provider, MD  losartan (COZAAR) 100 MG tablet Take 100 mg by mouth daily.    Historical Provider, MD  magnesium oxide (MAG-OX) 400 MG tablet Take 400 mg by mouth daily.    Historical Provider, MD  Multiple Vitamins-Minerals (ZINC PO) Take 1 capsule by mouth daily.    Historical Provider, MD  OVER THE COUNTER MEDICATION Apply 2 drops to eye at bedtime as needed (for itchy dry eyes.).    Historical Provider, MD  propranolol (INNOPRAN XL) 120 MG 24 hr capsule Take 120 mg by mouth at bedtime.    Historical Provider, MD  SPIRONOLACTONE PO Take by mouth.    Historical Provider, MD  vitamin E 400 UNIT capsule Take 400 Units by mouth daily.    Historical Provider, MD   BP 145/61  Pulse  68  Temp(Src) 97.8 F (36.6 C) (Oral)  Resp 18  SpO2 97% Physical Exam  Nursing note and vitals reviewed. Constitutional: She is oriented to person, place, and time. She appears well-developed and well-nourished.  HENT:  Head: Normocephalic and atraumatic.  Eyes: Conjunctivae and EOM are normal. Pupils are equal, round, and reactive to light.  Neck: Normal range of motion. Neck supple.  Cardiovascular: Normal rate and regular rhythm.  Exam reveals no gallop and no friction rub.   No murmur heard. Pulmonary/Chest: Effort normal and breath sounds normal. No respiratory distress. She has no wheezes. She has no rales. She exhibits no tenderness.  Abdominal: Soft. Bowel sounds are normal. She exhibits no distension and no mass. There is no tenderness. There is no rebound and no guarding.  Musculoskeletal: Normal range of motion. She exhibits no edema and no tenderness.  Right hip pain with palpation, RLE appears externally rotated and slightly shortened, no bony abnormality or deformity  Right knee ttp over the anterior aspect, mild abrasion to anterior shin  Left hip ROM and strength 5/5    Neurological: She is alert and oriented to person, place, and time.  Skin: Skin is warm and dry.  Abrasion to anterior right shin  Psychiatric: She has a normal mood and affect. Her behavior is normal. Judgment and thought content normal.    ED Course  Procedures (including critical care time) Results for orders placed during the hospital encounter of 02/20/14  CBC WITH DIFFERENTIAL      Result Value Ref Range   WBC 12.5 (*) 4.0 - 10.5 K/uL   RBC 3.92  3.87 - 5.11 MIL/uL   Hemoglobin 12.4  12.0 - 15.0 g/dL   HCT 16.135.5 (*) 09.636.0 - 04.546.0 %   MCV 90.6  78.0 - 100.0 fL   MCH 31.6  26.0 - 34.0 pg   MCHC 34.9  30.0 - 36.0 g/dL   RDW 40.912.4  81.111.5 - 91.415.5 %   Platelets 202  150 - 400 K/uL   Neutrophils Relative % 84 (*) 43 - 77 %   Neutro Abs 10.3 (*) 1.7 - 7.7 K/uL   Lymphocytes Relative 9 (*) 12 - 46  %   Lymphs Abs 1.1  0.7 - 4.0 K/uL   Monocytes Relative 7  3 - 12 %   Monocytes Absolute 0.9  0.1 - 1.0 K/uL   Eosinophils Relative 0  0 - 5 %   Eosinophils Absolute 0.0  0.0 - 0.7 K/uL   Basophils Relative 0  0 - 1 %   Basophils Absolute 0.0  0.0 - 0.1  K/uL  BASIC METABOLIC PANEL      Result Value Ref Range   Sodium 136 (*) 137 - 147 mEq/L   Potassium 4.5  3.7 - 5.3 mEq/L   Chloride 99  96 - 112 mEq/L   CO2 25  19 - 32 mEq/L   Glucose, Bld 97  70 - 99 mg/dL   BUN 15  6 - 23 mg/dL   Creatinine, Ser 1.61  0.50 - 1.10 mg/dL   Calcium 9.5  8.4 - 09.6 mg/dL   GFR calc non Af Amer 85 (*) >90 mL/min   GFR calc Af Amer >90  >90 mL/min   Anion gap 12  5 - 15   Dg Hip Bilateral W/pelvis  02/20/2014   CLINICAL DATA:  Fall  EXAM: BILATERAL HIP WITH PELVIS - 4+ VIEW  COMPARISON:  None.  FINDINGS: Nondisplaced intertrochanteric fracture right femur. Right hip joint is normal.  Negative for left hip fracture. Benign sclerotic lesion in the intertrochanteric region. Left hip joint is normal. No pelvic fracture  IMPRESSION: Nondisplaced intertrochanteric fracture right femur.   Electronically Signed   By: Marlan Palau M.D.   On: 02/20/2014 17:23   Dg Knee 1-2 Views Right  02/20/2014   CLINICAL DATA:  Fall, pain  EXAM: RIGHT KNEE - 1-2 VIEW  COMPARISON:  None.  FINDINGS: There is no evidence of fracture, dislocation, or joint effusion. There is no evidence of arthropathy or other focal bone abnormality. Soft tissues are unremarkable.  IMPRESSION: Negative.   Electronically Signed   By: Marlan Palau M.D.   On: 02/20/2014 17:22      EKG Interpretation None      MDM   Final diagnoses:  Intertrochanteric fracture of right hip, closed, initial encounter    Patient with mechanical fall.  Likely hip fracture vs dislocation.  Will control pain with fentanyl.  Imaging studies pending.  6:33 PM Discussed the patient with Dr. Dion Saucier, who will operate in the morning.  Medicine admit.  Transfer to  [facility].  Patient seen by and discussed with [facility], who will admit to the hospital.  Transfer to [facility].  Roxy Horseman, PA-C 02/20/14 1941

## 2014-02-21 ENCOUNTER — Encounter (HOSPITAL_COMMUNITY): Payer: Medicare Other | Admitting: Anesthesiology

## 2014-02-21 ENCOUNTER — Inpatient Hospital Stay (HOSPITAL_COMMUNITY): Payer: Medicare Other | Admitting: Anesthesiology

## 2014-02-21 ENCOUNTER — Inpatient Hospital Stay (HOSPITAL_COMMUNITY): Payer: Medicare Other

## 2014-02-21 ENCOUNTER — Encounter (HOSPITAL_COMMUNITY): Payer: Self-pay | Admitting: Family

## 2014-02-21 ENCOUNTER — Encounter (HOSPITAL_COMMUNITY)
Admission: EM | Disposition: A | Discharge status: Skilled Nursing Facility | Payer: Medicare Other | Source: Home / Self Care | Attending: Internal Medicine

## 2014-02-21 DIAGNOSIS — I10 Essential (primary) hypertension: Secondary | ICD-10-CM | POA: Diagnosis not present

## 2014-02-21 DIAGNOSIS — E039 Hypothyroidism, unspecified: Secondary | ICD-10-CM | POA: Diagnosis not present

## 2014-02-21 DIAGNOSIS — K436 Other and unspecified ventral hernia with obstruction, without gangrene: Secondary | ICD-10-CM | POA: Diagnosis not present

## 2014-02-21 DIAGNOSIS — S72143A Displaced intertrochanteric fracture of unspecified femur, initial encounter for closed fracture: Secondary | ICD-10-CM | POA: Diagnosis not present

## 2014-02-21 HISTORY — PX: INTRAMEDULLARY (IM) NAIL INTERTROCHANTERIC: SHX5875

## 2014-02-21 LAB — BASIC METABOLIC PANEL
Anion gap: 12 (ref 5–15)
BUN: 12 mg/dL (ref 6–23)
CHLORIDE: 100 meq/L (ref 96–112)
CO2: 23 meq/L (ref 19–32)
Calcium: 8.7 mg/dL (ref 8.4–10.5)
Creatinine, Ser: 0.68 mg/dL (ref 0.50–1.10)
GFR calc non Af Amer: 88 mL/min — ABNORMAL LOW (ref >90)
Glucose, Bld: 105 mg/dL — ABNORMAL HIGH (ref 70–99)
POTASSIUM: 4.1 meq/L (ref 3.7–5.3)
Sodium: 135 mEq/L — ABNORMAL LOW (ref 137–147)

## 2014-02-21 LAB — CBC
HCT: 33.1 % — ABNORMAL LOW (ref 36.0–46.0)
Hemoglobin: 11.3 g/dL — ABNORMAL LOW (ref 12.0–15.0)
MCH: 31.3 pg (ref 26.0–34.0)
MCHC: 34.1 g/dL (ref 30.0–36.0)
MCV: 91.7 fL (ref 78.0–100.0)
PLATELETS: 168 10*3/uL (ref 150–400)
RBC: 3.61 MIL/uL — AB (ref 3.87–5.11)
RDW: 12.6 % (ref 11.5–15.5)
WBC: 9.9 10*3/uL (ref 4.0–10.5)

## 2014-02-21 LAB — URINE MICROSCOPIC-ADD ON

## 2014-02-21 LAB — URINALYSIS, ROUTINE W REFLEX MICROSCOPIC
Bilirubin Urine: NEGATIVE
GLUCOSE, UA: NEGATIVE mg/dL
HGB URINE DIPSTICK: NEGATIVE
Ketones, ur: NEGATIVE mg/dL
Nitrite: NEGATIVE
PH: 6 (ref 5.0–8.0)
Protein, ur: NEGATIVE mg/dL
SPECIFIC GRAVITY, URINE: 1.014 (ref 1.005–1.030)
Urobilinogen, UA: 0.2 mg/dL (ref 0.0–1.0)

## 2014-02-21 LAB — SURGICAL PCR SCREEN
MRSA, PCR: NEGATIVE
STAPHYLOCOCCUS AUREUS: POSITIVE — AB

## 2014-02-21 SURGERY — FIXATION, FRACTURE, INTERTROCHANTERIC, WITH INTRAMEDULLARY ROD
Anesthesia: General | Site: Hip | Laterality: Right

## 2014-02-21 MED ORDER — GLYCOPYRROLATE 0.2 MG/ML IJ SOLN
INTRAMUSCULAR | Status: DC | PRN
  Administered 2014-02-21: 0.6 mg via INTRAVENOUS

## 2014-02-21 MED ORDER — SENNA 8.6 MG PO TABS
1.0000 | ORAL_TABLET | Freq: Two times a day (BID) | ORAL | Status: DC
  Administered 2014-02-22 – 2014-02-23 (×4): 8.6 mg via ORAL
  Filled 2014-02-21 (×7): qty 1

## 2014-02-21 MED ORDER — ACETAMINOPHEN 650 MG RE SUPP: 650.0000 mg | Freq: Four times a day (QID) | RECTAL | Status: DC | PRN

## 2014-02-21 MED ORDER — MIDAZOLAM HCL 2 MG/2ML IJ SOLN
INTRAMUSCULAR | Status: AC
  Filled 2014-02-21: qty 2

## 2014-02-21 MED ORDER — ONDANSETRON HCL 4 MG/2ML IJ SOLN
INTRAMUSCULAR | Status: AC
  Filled 2014-02-21: qty 2

## 2014-02-21 MED ORDER — GLYCOPYRROLATE 0.2 MG/ML IJ SOLN
INTRAMUSCULAR | Status: AC
  Filled 2014-02-21: qty 3

## 2014-02-21 MED ORDER — DEXTROSE 5 % IV SOLN
INTRAVENOUS | Status: DC | PRN
  Administered 2014-02-21: 18:00:00 via INTRAVENOUS

## 2014-02-21 MED ORDER — MAGNESIUM CITRATE PO SOLN: 1.0000 | Freq: Once | ORAL | Status: AC | PRN

## 2014-02-21 MED ORDER — ONDANSETRON HCL 4 MG PO TABS: 4.0000 mg | ORAL_TABLET | Freq: Four times a day (QID) | ORAL | Status: DC | PRN

## 2014-02-21 MED ORDER — BISACODYL 10 MG RE SUPP: 10.0000 mg | Freq: Every day | RECTAL | Status: DC | PRN

## 2014-02-21 MED ORDER — ENOXAPARIN SODIUM 40 MG/0.4ML ~~LOC~~ SOLN
40.0000 mg | SUBCUTANEOUS | Status: DC
  Administered 2014-02-22 – 2014-02-24 (×3): 40 mg via SUBCUTANEOUS
  Filled 2014-02-21 (×4): qty 0.4

## 2014-02-21 MED ORDER — HYDROMORPHONE HCL PF 1 MG/ML IJ SOLN
0.2500 mg | INTRAMUSCULAR | Status: DC | PRN
  Administered 2014-02-21: 0.5 mg via INTRAVENOUS

## 2014-02-21 MED ORDER — PROPOFOL 10 MG/ML IV BOLUS
INTRAVENOUS | Status: AC
  Filled 2014-02-21: qty 20

## 2014-02-21 MED ORDER — HYDROMORPHONE HCL PF 1 MG/ML IJ SOLN
INTRAMUSCULAR | Status: AC
  Filled 2014-02-21: qty 1

## 2014-02-21 MED ORDER — PHENYLEPHRINE 40 MCG/ML (10ML) SYRINGE FOR IV PUSH (FOR BLOOD PRESSURE SUPPORT)
PREFILLED_SYRINGE | INTRAVENOUS | Status: AC
  Filled 2014-02-21: qty 10

## 2014-02-21 MED ORDER — ALUM & MAG HYDROXIDE-SIMETH 200-200-20 MG/5ML PO SUSP: 30.0000 mL | ORAL | Status: DC | PRN

## 2014-02-21 MED ORDER — SODIUM CHLORIDE 0.9 % IJ SOLN
INTRAMUSCULAR | Status: AC
  Filled 2014-02-21: qty 10

## 2014-02-21 MED ORDER — HYDRALAZINE HCL 20 MG/ML IJ SOLN: 10.0000 mg | Freq: Four times a day (QID) | INTRAMUSCULAR | Status: DC | PRN

## 2014-02-21 MED ORDER — METOCLOPRAMIDE HCL 5 MG/ML IJ SOLN: 5.0000 mg | Freq: Three times a day (TID) | INTRAMUSCULAR | Status: DC | PRN

## 2014-02-21 MED ORDER — ROCURONIUM BROMIDE 50 MG/5ML IV SOLN
INTRAVENOUS | Status: AC
  Filled 2014-02-21: qty 1

## 2014-02-21 MED ORDER — CEFAZOLIN SODIUM-DEXTROSE 2-3 GM-% IV SOLR
INTRAVENOUS | Status: DC | PRN
  Administered 2014-02-21: 2 g via INTRAVENOUS

## 2014-02-21 MED ORDER — METOPROLOL TARTRATE 1 MG/ML IV SOLN: 5.0000 mg | INTRAVENOUS | Status: DC | PRN

## 2014-02-21 MED ORDER — OXYCODONE-ACETAMINOPHEN 5-325 MG PO TABS: 1.0000 | ORAL_TABLET | Freq: Four times a day (QID) | ORAL | Status: DC | PRN

## 2014-02-21 MED ORDER — PROMETHAZINE HCL 25 MG/ML IJ SOLN: 6.2500 mg | INTRAMUSCULAR | Status: DC | PRN

## 2014-02-21 MED ORDER — BACLOFEN 10 MG PO TABS: 10.0000 mg | ORAL_TABLET | Freq: Three times a day (TID) | ORAL | Status: DC | PRN

## 2014-02-21 MED ORDER — 0.9 % SODIUM CHLORIDE (POUR BTL) OPTIME
TOPICAL | Status: DC | PRN
  Administered 2014-02-21: 1000 mL

## 2014-02-21 MED ORDER — ENOXAPARIN SODIUM 40 MG/0.4ML ~~LOC~~ SOLN: 40.0000 mg | SUBCUTANEOUS | Status: DC

## 2014-02-21 MED ORDER — ACETAMINOPHEN 325 MG PO TABS
650.0000 mg | ORAL_TABLET | Freq: Four times a day (QID) | ORAL | Status: DC | PRN
  Administered 2014-02-22 (×2): 650 mg via ORAL
  Filled 2014-02-21 (×3): qty 2

## 2014-02-21 MED ORDER — METOCLOPRAMIDE HCL 10 MG PO TABS: 5.0000 mg | ORAL_TABLET | Freq: Three times a day (TID) | ORAL | Status: DC | PRN

## 2014-02-21 MED ORDER — FENTANYL CITRATE 0.05 MG/ML IJ SOLN
INTRAMUSCULAR | Status: DC | PRN
  Administered 2014-02-21: 50 ug via INTRAVENOUS
  Administered 2014-02-21: 100 ug via INTRAVENOUS
  Administered 2014-02-21: 150 ug via INTRAVENOUS

## 2014-02-21 MED ORDER — PHENYLEPHRINE HCL 10 MG/ML IJ SOLN
INTRAMUSCULAR | Status: DC | PRN
  Administered 2014-02-21 (×2): 80 ug via INTRAVENOUS

## 2014-02-21 MED ORDER — PHENOL 1.4 % MT LIQD: 1.0000 | OROMUCOSAL | Status: DC | PRN

## 2014-02-21 MED ORDER — LIDOCAINE HCL (CARDIAC) 20 MG/ML IV SOLN
INTRAVENOUS | Status: DC | PRN
  Administered 2014-02-21: 100 mg via INTRAVENOUS

## 2014-02-21 MED ORDER — PROPOFOL 10 MG/ML IV BOLUS
INTRAVENOUS | Status: DC | PRN
  Administered 2014-02-21: 200 mg via INTRAVENOUS

## 2014-02-21 MED ORDER — MENTHOL 3 MG MT LOZG: 1.0000 | LOZENGE | OROMUCOSAL | Status: DC | PRN

## 2014-02-21 MED ORDER — FENTANYL CITRATE 0.05 MG/ML IJ SOLN
INTRAMUSCULAR | Status: AC
  Filled 2014-02-21: qty 5

## 2014-02-21 MED ORDER — EPHEDRINE SULFATE 50 MG/ML IJ SOLN
INTRAMUSCULAR | Status: AC
  Filled 2014-02-21: qty 1

## 2014-02-21 MED ORDER — LACTATED RINGERS IV SOLN
INTRAVENOUS | Status: DC | PRN
  Administered 2014-02-21 (×2): via INTRAVENOUS

## 2014-02-21 MED ORDER — ROCURONIUM BROMIDE 100 MG/10ML IV SOLN
INTRAVENOUS | Status: DC | PRN
  Administered 2014-02-21: 30 mg via INTRAVENOUS
  Administered 2014-02-21 (×2): 10 mg via INTRAVENOUS

## 2014-02-21 MED ORDER — NEOSTIGMINE METHYLSULFATE 10 MG/10ML IV SOLN
INTRAVENOUS | Status: DC | PRN
  Administered 2014-02-21: 4 mg via INTRAVENOUS

## 2014-02-21 MED ORDER — POLYETHYLENE GLYCOL 3350 17 G PO PACK: 17.0000 g | PACK | Freq: Every day | ORAL | Status: DC | PRN

## 2014-02-21 MED ORDER — SENNA-DOCUSATE SODIUM 8.6-50 MG PO TABS: 2.0000 | ORAL_TABLET | Freq: Every day | ORAL | Status: DC

## 2014-02-21 MED ORDER — DOCUSATE SODIUM 100 MG PO CAPS: 100.0000 mg | ORAL_CAPSULE | Freq: Two times a day (BID) | ORAL | Status: DC

## 2014-02-21 MED ORDER — EPHEDRINE SULFATE 50 MG/ML IJ SOLN
INTRAMUSCULAR | Status: DC | PRN
  Administered 2014-02-21 (×2): 10 mg via INTRAVENOUS
  Administered 2014-02-21: 5 mg via INTRAVENOUS

## 2014-02-21 MED ORDER — ONDANSETRON HCL 4 MG/2ML IJ SOLN
4.0000 mg | Freq: Four times a day (QID) | INTRAMUSCULAR | Status: DC | PRN
  Administered 2014-02-22 (×2): 4 mg via INTRAVENOUS
  Filled 2014-02-21 (×2): qty 2

## 2014-02-21 MED ORDER — POTASSIUM CHLORIDE IN NACL 20-0.45 MEQ/L-% IV SOLN
INTRAVENOUS | Status: DC
  Administered 2014-02-22: 01:00:00 via INTRAVENOUS
  Filled 2014-02-21 (×2): qty 1000

## 2014-02-21 MED ORDER — NEOSTIGMINE METHYLSULFATE 10 MG/10ML IV SOLN
INTRAVENOUS | Status: AC
  Filled 2014-02-21: qty 1

## 2014-02-21 MED ORDER — MIDAZOLAM HCL 5 MG/5ML IJ SOLN
INTRAMUSCULAR | Status: DC | PRN
  Administered 2014-02-21: 2 mg via INTRAVENOUS

## 2014-02-21 MED ORDER — ONDANSETRON HCL 4 MG/2ML IJ SOLN
INTRAMUSCULAR | Status: DC | PRN
  Administered 2014-02-21: 4 mg via INTRAVENOUS

## 2014-02-21 MED ORDER — CEFAZOLIN SODIUM-DEXTROSE 2-3 GM-% IV SOLR
2.0000 g | Freq: Four times a day (QID) | INTRAVENOUS | Status: AC
  Administered 2014-02-22 (×2): 2 g via INTRAVENOUS
  Filled 2014-02-21 (×2): qty 50

## 2014-02-21 SURGICAL SUPPLY — 57 items
APL SKNCLS STERI-STRIP NONHPOA (GAUZE/BANDAGES/DRESSINGS) ×2
BENZOIN TINCTURE PRP APPL 2/3 (GAUZE/BANDAGES/DRESSINGS) ×3 IMPLANT
BIT DRILL CANN 16 HIP (BIT) ×1 IMPLANT
BIT DRILL CANN STP 6/9 HIP (BIT) ×1 IMPLANT
BLADE HELICAL TFNA 95 STRL (Anchor) ×1 IMPLANT
BNDG GAUZE ELAST 4 BULKY (GAUZE/BANDAGES/DRESSINGS) ×1 IMPLANT
BOOTCOVER CLEANROOM LRG (PROTECTIVE WEAR) ×4 IMPLANT
CLSR STERI-STRIP ANTIMIC 1/2X4 (GAUZE/BANDAGES/DRESSINGS) ×3 IMPLANT
COVER PERINEAL POST (MISCELLANEOUS) ×2 IMPLANT
COVER SURGICAL LIGHT HANDLE (MISCELLANEOUS) ×2 IMPLANT
DRAPE STERI IOBAN 125X83 (DRAPES) ×2 IMPLANT
DRSG MEPILEX BORDER 4X4 (GAUZE/BANDAGES/DRESSINGS) ×3 IMPLANT
DRSG MEPILEX BORDER 4X8 (GAUZE/BANDAGES/DRESSINGS) ×4 IMPLANT
DURAPREP 26ML APPLICATOR (WOUND CARE) ×2 IMPLANT
ELECT CAUTERY BLADE 6.4 (BLADE) ×2 IMPLANT
ELECT REM PT RETURN 9FT ADLT (ELECTROSURGICAL) ×2
ELECTRODE REM PT RTRN 9FT ADLT (ELECTROSURGICAL) ×1 IMPLANT
EVACUATOR 1/8 PVC DRAIN (DRAIN) IMPLANT
FACESHIELD WRAPAROUND (MASK) ×4 IMPLANT
FACESHIELD WRAPAROUND OR TEAM (MASK) ×2 IMPLANT
GAUZE XEROFORM 5X9 LF (GAUZE/BANDAGES/DRESSINGS) ×2 IMPLANT
GLOVE BIOGEL PI IND STRL 6.5 (GLOVE) IMPLANT
GLOVE BIOGEL PI IND STRL 7.0 (GLOVE) IMPLANT
GLOVE BIOGEL PI IND STRL 7.5 (GLOVE) IMPLANT
GLOVE BIOGEL PI INDICATOR 6.5 (GLOVE) ×4
GLOVE BIOGEL PI INDICATOR 7.0 (GLOVE) ×2
GLOVE BIOGEL PI INDICATOR 7.5 (GLOVE) ×1
GLOVE BIOGEL PI ORTHO PRO SZ8 (GLOVE) ×1
GLOVE ECLIPSE 6.5 STRL STRAW (GLOVE) ×1 IMPLANT
GLOVE ORTHO TXT STRL SZ7.5 (GLOVE) ×4 IMPLANT
GLOVE PI ORTHO PRO STRL SZ8 (GLOVE) ×1 IMPLANT
GLOVE SURG ORTHO 8.0 STRL STRW (GLOVE) ×4 IMPLANT
GLOVE SURG SS PI 6.5 STRL IVOR (GLOVE) ×1 IMPLANT
GLOVE SURG SS PI 7.5 STRL IVOR (GLOVE) ×1 IMPLANT
GOWN STRL REUS W/ TWL LRG LVL3 (GOWN DISPOSABLE) IMPLANT
GOWN STRL REUS W/ TWL XL LVL3 (GOWN DISPOSABLE) IMPLANT
GOWN STRL REUS W/TWL LRG LVL3 (GOWN DISPOSABLE) ×8
GOWN STRL REUS W/TWL XL LVL3 (GOWN DISPOSABLE) ×2
GUIDEWIRE 3.2X400 (WIRE) ×1 IMPLANT
KIT ROOM TURNOVER OR (KITS) ×2 IMPLANT
LINER BOOT UNIVERSAL DISP (MISCELLANEOUS) ×2 IMPLANT
MANIFOLD NEPTUNE II (INSTRUMENTS) ×1 IMPLANT
NAIL TROCH FIX LNG 11X380RT (Nail) ×1 IMPLANT
NS IRRIG 1000ML POUR BTL (IV SOLUTION) ×2 IMPLANT
PACK GENERAL/GYN (CUSTOM PROCEDURE TRAY) ×2 IMPLANT
PAD ARMBOARD 7.5X6 YLW CONV (MISCELLANEOUS) ×4 IMPLANT
SLEEVE SURGEON STRL (DRAPES) ×1 IMPLANT
STAPLER VISISTAT 35W (STAPLE) ×2 IMPLANT
SUT VIC AB 0 CTB1 27 (SUTURE) ×2 IMPLANT
SUT VIC AB 2-0 FS1 27 (SUTURE) ×2 IMPLANT
SUT VIC AB 2-0 SH 27 (SUTURE)
SUT VIC AB 2-0 SH 27XBRD (SUTURE) IMPLANT
SUT VIC AB 3-0 SH 8-18 (SUTURE) ×3 IMPLANT
TOWEL OR 17X24 6PK STRL BLUE (TOWEL DISPOSABLE) ×2 IMPLANT
TOWEL OR 17X26 10 PK STRL BLUE (TOWEL DISPOSABLE) ×2 IMPLANT
TRAY FOLEY CATH 16FRSI W/METER (SET/KITS/TRAYS/PACK) ×2 IMPLANT
WATER STERILE IRR 1000ML POUR (IV SOLUTION) ×2 IMPLANT

## 2014-02-21 NOTE — Progress Notes (Signed)
The patient has been re-examined, and the chart reviewed, and there have been no interval changes to the documented history and physical.    The risks, benefits, and alternatives have been discussed at length, and the patient is willing to proceed.    Zebastian Carico P, MD  

## 2014-02-21 NOTE — Discharge Instructions (Signed)
Diet: As you were doing prior to hospitalization  ° °Shower:  May shower but keep the wounds dry, use an occlusive plastic wrap, NO SOAKING IN TUB.  If the bandage gets wet, change with a clean dry gauze. ° °Dressing:  You may change your dressing 3-5 days after surgery.  Then change the dressing daily with sterile gauze dressing.   ° °There are sticky tapes (steri-strips) on your wounds and all the stitches are absorbable.  Leave the steri-strips in place when changing your dressings, they will peel off with time, usually 2-3 weeks. ° °Activity:  Increase activity slowly as tolerated, but follow the weight bearing instructions below.  No lifting or driving for 6 weeks. ° °Weight Bearing:   As tolerated.   ° °To prevent constipation: you may use a stool softener such as - ° °Colace (over the counter) 100 mg by mouth twice a day  °Drink plenty of fluids (prune juice may be helpful) and high fiber foods °Miralax (over the counter) for constipation as needed.   ° °Itching:  If you experience itching with your medications, try taking only a single pain pill, or even half a pain pill at a time.  You may take up to 10 pain pills per day, and you can also use benadryl over the counter for itching or also to help with sleep.  ° °Precautions:  If you experience chest pain or shortness of breath - call 911 immediately for transfer to the hospital emergency department!! ° °If you develop a fever greater that 101 F, purulent drainage from wound, increased redness or drainage from wound, or calf pain -- Call the office at [phone]                                                °Follow- Up Appointment:  Please call for an appointment to be seen in 2 weeks [place] - [phone] ° ° ° ° ° °

## 2014-02-21 NOTE — Progress Notes (Signed)
Patient Demographics  Megan Glover, is a 68 y.o. female, DOB - 09-02-1945, ZOX:[id number]  Admit date - 02/20/2014   Admitting Physician Eulas PostJoshua P Landau, MD  Outpatient Primary MD for the patient is FRIED, Doris CheadleOBERT L, MD  LOS - 1   Chief Complaint  Patient presents with  . Fall  . Leg Pain           Subjective:   Megan FloroKathleen Haye today has, No headache, No chest pain, No abdominal pain - No Nausea, No new weakness tingling or numbness, No Cough - SOB. + R Hip pain    Assessment & Plan    1. Mechanical fall with R Femur nondisplaced intertrochanteric fracture. Orthopedics Dr. Dion SaucierLandau on board patient due for open reduction internal fixation of the right femur on 02/21/2014.  Cardio-Pulm Risk stratification for surgery and recommendations to minimize the same:-  A.Cardio-Pulmonary Risk -  this patient is a low to moderate for adverse Cardio-Pulmonary  Outcome  from surgery, the risks and benefits were discussed and acceptable to  patient  Recommendations for optimizing Cardio-Pulmonary  Risk risk factors  1. Keep SBP<140, HR<85, use Lopressor 5mg  IV q4hrs PRN, or B.Blocker drip PRN. 2. Moniotr I&Os. 3. Minimal sedation and Narcotics. 4. Good pulmunary toilet. 5. PRN Nebs and as needed oxygen to keep Pox>90% 6. Hb>8, transfuse as needed- Lasix 10mg  IV after each unit PRBC Transfused.   B.Bleeding Risk - no previous surgical complications, no easy bruising,  Antiplate meds None   Lab Results  Component Value Date   PLT 168 02/21/2014                  No results found for this basename: INR, PROTIME      Will request Surgeon to please Order Lovenox/DVT prophylaxis of his/her choice when OK from Surgeon's standpoint post op.     2. Fibromyalgia, Irritable bowel syndrome with  constipation - stable. Supportive care.    3.HTN - on Cozaar, hydralazine and beta blocker at home at home will continue postop, currently as needed IV Lopressor and hydralazine.       Code Status: Full Family Communication: none present  Disposition Plan: TBD   Procedures ORIF right femur on 02/21/2014   Consults  orthopedics Dr. Dion SaucierLandau   Medications  Scheduled Meds: . ALPRAZolam  0.25-0.75 mg Oral See admin instructions  . busPIRone  15 mg Oral TID  .  ceFAZolin (ANCEF) IV  2 g Intravenous On Call to OR  . cholecalciferol  2,000 Units Oral Daily  . docusate sodium  100 mg Oral BID  . ferrous sulfate  325 mg Oral TID PC  . hydrALAZINE  10 mg Oral TID  . losartan  100 mg Oral Daily  . magnesium oxide  400 mg Oral Daily  . nebivolol  20 mg Oral Daily   Continuous Infusions: . sodium chloride 50 mL/hr at 02/20/14 2227   PRN Meds:.albuterol, bisacodyl, clonazePAM, cyclobenzaprine, HYDROcodone-acetaminophen, morphine injection, oxyCODONE  DVT Prophylaxis  SCD  Lab Results  Component Value Date   PLT 168 02/21/2014    Antibiotics    Anti-infectives   Start     Dose/Rate Route Frequency Ordered Stop   02/21/14 0600  ceFAZolin (ANCEF) IVPB 2 g/50 mL  premix     2 g 100 mL/hr over 30 Minutes Intravenous On call to O.R. 02/20/14 2322 02/22/14 0559          Objective:   Filed Vitals:   02/20/14 2130 02/20/14 2221 02/21/14 0057 02/21/14 0516  BP: 145/61 165/72 145/56 119/51  Pulse: 62 73 76 81  Temp:  97.8 F (36.6 C) 99.8 F (37.7 C) 99.2 F (37.3 C)  TempSrc:      Resp:  16 16 16   SpO2: 99% 100% 94% 94%    Wt Readings from Last 3 Encounters:  01/30/14 72.576 kg (160 lb)  12/10/13 71.668 kg (158 lb)  09/13/13 72.1 kg (158 lb 15.2 oz)     Intake/Output Summary (Last 24 hours) at 02/21/14 1000 Last data filed at 02/20/14 2222  Gross per 24 hour  Intake      0 ml  Output      1 ml  Net     -1 ml     Physical Exam  Awake Alert, Oriented X  3, No new F.N deficits, Normal affect [place].AT,PERRAL Supple Neck,No JVD, No cervical lymphadenopathy appriciated.  Symmetrical Chest wall movement, Good air movement bilaterally, CTAB RRR,No Gallops,Rubs or new Murmurs, No Parasternal Heave +ve B.Sounds, Abd Soft, No tenderness, No organomegaly appriciated, No rebound - guarding or rigidity. No Cyanosis, Clubbing or edema, No new Rash or bruise , R leg Ext rotated   Data Review   Micro Results Recent Results (from the past 240 hour(s))  SURGICAL PCR SCREEN     Status: Abnormal   Collection Time    02/21/14  5:57 AM      Result Value Ref Range Status   MRSA, PCR NEGATIVE  NEGATIVE Final   Staphylococcus aureus POSITIVE (*) NEGATIVE Final   Comment:            The Xpert SA Assay (FDA     approved for NASAL specimens     in patients over 34 years of age),     is one component of     a comprehensive surveillance     program.  Test performance has     been validated by [employer] for patients greater     than or equal to 57 year old.     It is not intended     to diagnose infection nor to     guide or monitor treatment.    Radiology Reports Dg Hip Bilateral W/pelvis  02/20/2014   CLINICAL DATA:  Fall  EXAM: BILATERAL HIP WITH PELVIS - 4+ VIEW  COMPARISON:  None.  FINDINGS: Nondisplaced intertrochanteric fracture right femur. Right hip joint is normal.  Negative for left hip fracture. Benign sclerotic lesion in the intertrochanteric region. Left hip joint is normal. No pelvic fracture  IMPRESSION: Nondisplaced intertrochanteric fracture right femur.   Electronically Signed   By: Marlan Palau M.D.   On: 02/20/2014 17:23   Dg Knee 1-2 Views Right  02/20/2014   CLINICAL DATA:  Fall, pain  EXAM: RIGHT KNEE - 1-2 VIEW  COMPARISON:  None.  FINDINGS: There is no evidence of fracture, dislocation, or joint effusion. There is no evidence of arthropathy or other focal bone abnormality. Soft tissues are unremarkable.  IMPRESSION: Negative.    Electronically Signed   By: Marlan Palau M.D.   On: 02/20/2014 17:22   Dg Chest Portable 1 View  02/21/2014   CLINICAL DATA:  Preop for hip surgery.  EXAM: PORTABLE  CHEST - 1 VIEW  COMPARISON:  Two-view chest 09/12/2013.  FINDINGS: Heart size is normal. Minimal bibasilar scarring is stable. The lungs are otherwise clear. Atherosclerotic calcifications are noted at the aortic arch. The visualized soft tissues and bony thorax are unremarkable.  IMPRESSION: 1. No acute cardiopulmonary disease or significant interval change. 2. Atherosclerosis.   Electronically Signed   By: Gennette Pac M.D.   On: 02/21/2014 08:01    CBC  Recent Labs Lab 02/20/14 1749 02/21/14 0630  WBC 12.5* 9.9  HGB 12.4 11.3*  HCT 35.5* 33.1*  PLT 202 168  MCV 90.6 91.7  MCH 31.6 31.3  MCHC 34.9 34.1  RDW 12.4 12.6  LYMPHSABS 1.1  --   MONOABS 0.9  --   EOSABS 0.0  --   BASOSABS 0.0  --     Chemistries   Recent Labs Lab 02/20/14 1749 02/21/14 0630  NA 136* 135*  K 4.5 4.1  CL 99 100  CO2 25 23  GLUCOSE 97 105*  BUN 15 12  CREATININE 0.74 0.68  CALCIUM 9.5 8.7   ------------------------------------------------------------------------------------------------------------------ CrCl is unknown because both a height and weight (above a minimum accepted value) are required for this calculation. ------------------------------------------------------------------------------------------------------------------ No results found for this basename: HGBA1C,  in the last 72 hours ------------------------------------------------------------------------------------------------------------------ No results found for this basename: CHOL, HDL, LDLCALC, TRIG, CHOLHDL, LDLDIRECT,  in the last 72 hours ------------------------------------------------------------------------------------------------------------------ No results found for this basename: TSH, T4TOTAL, FREET3, T3FREE, THYROIDAB,  in the last 72  hours ------------------------------------------------------------------------------------------------------------------ No results found for this basename: VITAMINB12, FOLATE, FERRITIN, TIBC, IRON, RETICCTPCT,  in the last 72 hours  Coagulation profile No results found for this basename: INR, PROTIME,  in the last 168 hours  No results found for this basename: DDIMER,  in the last 72 hours  Cardiac Enzymes No results found for this basename: CK, CKMB, TROPONINI, MYOGLOBIN,  in the last 168 hours ------------------------------------------------------------------------------------------------------------------ No components found with this basename: POCBNP,      Time Spent in minutes  35   Merriel Zinger K M.D on 02/21/2014 at 10:00 AM  Between 7am to 7pm - Pager - [phone]  After 7pm go to [email or web address] - password [email or web address]  And look for the night coverage person covering for me after hours  [facility] Office  [phone]   **Disclaimer: This note may have been dictated with voice recognition software. Similar sounding words can inadvertently be transcribed and this note may contain transcription errors which may not have been corrected upon publication of note.**

## 2014-02-21 NOTE — Op Note (Signed)
DATE OF SURGERY:  02/21/2014  TIME: 7:46 PM  PATIENT NAME:  Megan Glover  AGE: 68 y.o.  PRE-OPERATIVE DIAGNOSIS:  right Hip fracture   POST-OPERATIVE DIAGNOSIS:  SAME  PROCEDURE:  INTRAMEDULLARY (IM) NAIL INTERTROCHANTRIC  SURGEON:  Decklyn Hornik P  ASSISTANT:  Janace LittenBrandon Parry, OPA-C, present and scrubbed throughout the case, critical for assistance with exposure, retraction, instrumentation, and closure.  OPERATIVE IMPLANTS: Synthes trochanteric femoral nail with interlocking helical blade into the femoral head.  PREOPERATIVE INDICATIONS:  Megan Glover is a 68 y.o. year old who fell and suffered a hip fracture. She was brought into the ER and then admitted and optimized and then elected for surgical intervention.    The risks benefits and alternatives were discussed with the patient including but not limited to the risks of nonoperative treatment, versus surgical intervention including infection, bleeding, nerve injury, malunion, nonunion, hardware prominence, hardware failure, need for hardware removal, blood clots, cardiopulmonary complications, morbidity, mortality, among others, and they were willing to proceed.    OPERATIVE PROCEDURE:  The patient was brought to the operating room and placed in the supine position. General anesthesia was administered, with a foley. She was placed on the fracture table.  Closed reduction was performed under C-arm guidance. The length of the femur was also measured using fluoroscopy. Time out was then performed after sterile prep and drape. She received preoperative antibiotics.  Incision was made proximal to the greater trochanter. A guidewire was placed in the appropriate position. Confirmation was made on AP and lateral views. The above-named nail was opened. I opened the proximal femur with a reamer. I then placed the nail by hand easily down. I did not need to ream the femur. During placement of the nail however this pushed the fracture  fragment away from the anatomic reduction, and I had to remove the nail, and re\re reamed the actual medial fracture fragment, in order to optimize the ultimate position. The fracture was anatomic before placing the nail, afterwards was not quite perfect, and so I removed the nail in order to improve the configuration. After repeat reaming of the medial segment, and the nail sat nicely, and the fracture fragments were nearly perfectly aligned.  Once the nail was completely seated, I placed a guidepin into the femoral head into the center center position. I measured the length, and then reamed the lateral cortex and up into the head. I then placed the helical blade. Slight compression was applied. Anatomic fixation achieved. Bone quality was mediocre.  I then secured the proximal interlocking bolt, and took off a half a turn, and then removed the instruments, and took final C-arm pictures AP and lateral the entire length of the leg. Anatomic reconstruction was achieved, and the wounds were irrigated copiously and closed with Vicryl followed by Steri-Strips and sterile gauze for the skin. The patient was awakened and returned to PACU in stable and satisfactory condition. There no complications and the patient tolerated the procedure well.  She will be weightbearing as tolerated, and will be on Lovenox  for a period of three weeks after discharge.   Teryl LucyJoshua Kymiah Araiza, M.D.

## 2014-02-21 NOTE — Care Management Note (Signed)
CARE MANAGEMENT NOTE 02/21/2014  Patient:  Daisy FloroHOMMEL,Dorothymae   Account Number:  [id number]  Date Initiated:  02/21/2014  Documentation initiated by:  Vance PeperBRADY,Rebecah Dangerfield  Subjective/Objective Assessment:   68 yr old female admitted with right hip fracture.     Action/Plan:   patient states she will go home after surgery, lives with husband.  PT/OT eval   Anticipated DC Date:     Anticipated DC Plan:  HOME W HOME HEALTH SERVICES      DC Planning Services  CM consult      PAC Choice  DURABLE MEDICAL EQUIPMENT  HOME HEALTH   Choice offered to / List presented to:             Status of service:  In process, will continue to follow

## 2014-02-21 NOTE — Progress Notes (Signed)
Nutrition Brief Note  Hip Fracture Protocol Consult  Per pt she has had no recent weight changes and had a good appetite PTA.  Reviewed importance of adequate nutrition, especially protein. Reviewed protein sources.   Wt Readings from Last 15 Encounters:  01/30/14 160 lb (72.576 kg)  12/10/13 158 lb (71.668 kg)  09/13/13 158 lb 15.2 oz (72.1 kg)  09/12/13 162 lb (73.483 kg)  08/14/13 161 lb (73.029 kg)  08/01/13 160 lb (72.576 kg)  07/25/13 163 lb (73.936 kg)  07/16/13 162 lb (73.483 kg)  04/12/13 163 lb (73.936 kg)    BMI: 25.1 (overweight)  Current diet order is NPO. Labs and medications reviewed.   No nutrition interventions warranted at this time. If nutrition issues arise, please consult RD.   Kendell BaneHeather Massiah Minjares RD, LDN, CNSC [phone] Pager [phone] After Hours Pager

## 2014-02-21 NOTE — Progress Notes (Signed)
Utilization review completed.  

## 2014-02-21 NOTE — Anesthesia Procedure Notes (Signed)
Procedure Name: Intubation Date/Time: 02/21/2014 6:13 PM Performed by: Eloisa Chokshi S Pre-anesthesia Checklist: Patient identified, Timeout performed, Emergency Drugs available, Suction available and Patient being monitored Patient Re-evaluated:Patient Re-evaluated prior to inductionOxygen Delivery Method: Circle system utilized Preoxygenation: Pre-oxygenation with 100% oxygen Intubation Type: IV induction Ventilation: Mask ventilation without difficulty Laryngoscope Size: Mac and 3 Grade View: Grade I Tube type: Oral Tube size: 7.5 mm Number of attempts: 1 Airway Equipment and Method: Stylet Placement Confirmation: ETT inserted through vocal cords under direct vision,  positive ETCO2 and breath sounds checked- equal and bilateral Secured at: 22 cm Tube secured with: Tape Dental Injury: Teeth and Oropharynx as per pre-operative assessment

## 2014-02-21 NOTE — Anesthesia Postprocedure Evaluation (Signed)
  Anesthesia Post-op Note  Patient: Megan Glover  Procedure(s) Performed: Procedure(s): INTRAMEDULLARY (IM) NAIL INTERTROCHANTRIC (Right)  Patient Location: PACU  Anesthesia Type:General  Level of Consciousness: awake  Airway and Oxygen Therapy: Patient Spontanous Breathing  Post-op Pain: mild  Post-op Assessment: Post-op Vital signs reviewed, Patient's Cardiovascular Status Stable, Respiratory Function Stable, Patent Airway, No signs of Nausea or vomiting and Pain level controlled  Post-op Vital Signs: Reviewed and stable  Last Vitals:  Filed Vitals:   02/21/14 2259  BP: 122/45  Pulse: 72  Temp: 36.7 C  Resp: 14    Complications: No apparent anesthesia complications

## 2014-02-21 NOTE — Transfer of Care (Signed)
Immediate Anesthesia Transfer of Care Note  Patient: Daisy FloroKathleen Tardiff  Procedure(s) Performed: Procedure(s): INTRAMEDULLARY (IM) NAIL INTERTROCHANTRIC (Right)  Patient Location: PACU  Anesthesia Type:General  Level of Consciousness: awake, alert  and oriented  Airway & Oxygen Therapy: Patient Spontanous Breathing and Patient connected to nasal cannula oxygen  Post-op Assessment: Report given to PACU RN and Post -op Vital signs reviewed and stable  Post vital signs: Reviewed and stable  Complications: No apparent anesthesia complications

## 2014-02-21 NOTE — Anesthesia Preprocedure Evaluation (Signed)
Anesthesia Evaluation  Patient identified by MRN, date of birth, ID band Patient awake    Reviewed: Allergy & Precautions, H&P , NPO status , Patient's Chart, lab work & pertinent test results  History of Anesthesia Complications Negative for: history of anesthetic complications  Airway Mallampati: II TM Distance: >3 FB Neck ROM: Full    Dental  (+) Teeth Intact, Dental Advisory Given   Pulmonary neg pulmonary ROS,    Pulmonary exam normal       Cardiovascular hypertension, Pt. on medications and Pt. on home beta blockers     Neuro/Psych negative neurological ROS  negative psych ROS   GI/Hepatic negative GI ROS, Neg liver ROS,   Endo/Other  Hypothyroidism   Renal/GU negative Renal ROS     Musculoskeletal  (+) Fibromyalgia -  Abdominal   Peds  Hematology   Anesthesia Other Findings   Reproductive/Obstetrics                           Anesthesia Physical Anesthesia Plan  ASA: II  Anesthesia Plan: General   Post-op Pain Management:    Induction: Intravenous  Airway Management Planned: Oral ETT  Additional Equipment:   Intra-op Plan:   Post-operative Plan: Extubation in OR  Informed Consent: I have reviewed the patients History and Physical, chart, labs and discussed the procedure including the risks, benefits and alternatives for the proposed anesthesia with the patient or authorized representative who has indicated his/her understanding and acceptance.   Dental advisory given  Plan Discussed with: CRNA, Anesthesiologist and Surgeon  Anesthesia Plan Comments:         Anesthesia Quick Evaluation

## 2014-02-22 LAB — BASIC METABOLIC PANEL
Anion gap: 15 (ref 5–15)
BUN: 9 mg/dL (ref 6–23)
CHLORIDE: 97 meq/L (ref 96–112)
CO2: 21 mEq/L (ref 19–32)
CREATININE: 0.65 mg/dL (ref 0.50–1.10)
Calcium: 8.3 mg/dL — ABNORMAL LOW (ref 8.4–10.5)
GFR calc non Af Amer: 89 mL/min — ABNORMAL LOW (ref >90)
Glucose, Bld: 108 mg/dL — ABNORMAL HIGH (ref 70–99)
POTASSIUM: 4.5 meq/L (ref 3.7–5.3)
Sodium: 133 mEq/L — ABNORMAL LOW (ref 137–147)

## 2014-02-22 LAB — CBC
HCT: 29.2 % — ABNORMAL LOW (ref 36.0–46.0)
HCT: 28.5 % — ABNORMAL LOW (ref 36.0–46.0)
HEMOGLOBIN: 10.1 g/dL — AB (ref 12.0–15.0)
Hemoglobin: 9.8 g/dL — ABNORMAL LOW (ref 12.0–15.0)
MCH: 31.8 pg (ref 26.0–34.0)
MCH: 31.3 pg (ref 26.0–34.0)
MCHC: 34.4 g/dL (ref 30.0–36.0)
MCHC: 34.6 g/dL (ref 30.0–36.0)
MCV: 91.8 fL (ref 78.0–100.0)
MCV: 91.1 fL (ref 78.0–100.0)
Platelets: 134 10*3/uL — ABNORMAL LOW (ref 150–400)
Platelets: 130 10*3/uL — ABNORMAL LOW (ref 150–400)
RBC: 3.13 MIL/uL — ABNORMAL LOW (ref 3.87–5.11)
RBC: 3.18 MIL/uL — AB (ref 3.87–5.11)
RDW: 12.6 % (ref 11.5–15.5)
RDW: 12.7 % (ref 11.5–15.5)
WBC: 10.1 10*3/uL (ref 4.0–10.5)
WBC: 11.1 10*3/uL — AB (ref 4.0–10.5)

## 2014-02-22 LAB — CREATININE, SERUM
CREATININE: 0.65 mg/dL (ref 0.50–1.10)
GFR calc non Af Amer: 89 mL/min — ABNORMAL LOW (ref >90)

## 2014-02-22 MED ORDER — PROMETHAZINE HCL 25 MG/ML IJ SOLN: 12.5000 mg | Freq: Three times a day (TID) | INTRAMUSCULAR | Status: DC | PRN

## 2014-02-22 MED ORDER — DEXAMETHASONE SODIUM PHOSPHATE 10 MG/ML IJ SOLN
10.0000 mg | Freq: Once | INTRAMUSCULAR | Status: AC
  Administered 2014-02-22: 10 mg via INTRAVENOUS
  Filled 2014-02-22 (×2): qty 1

## 2014-02-22 MED ORDER — SODIUM CHLORIDE 0.9 % IV SOLN
INTRAVENOUS | Status: DC
  Administered 2014-02-22 – 2014-02-23 (×2): via INTRAVENOUS
  Filled 2014-02-22 (×5): qty 1000

## 2014-02-22 MED ORDER — SODIUM CHLORIDE 0.9 % IV BOLUS (SEPSIS)
500.0000 mL | INTRAVENOUS | Status: DC | PRN
  Administered 2014-02-22: 500 mL via INTRAVENOUS

## 2014-02-22 MED ORDER — LOSARTAN POTASSIUM 25 MG PO TABS
25.0000 mg | ORAL_TABLET | Freq: Every day | ORAL | Status: DC
  Administered 2014-02-23 – 2014-02-24 (×2): 25 mg via ORAL
  Filled 2014-02-22 (×2): qty 1

## 2014-02-22 MED ORDER — NEBIVOLOL HCL 10 MG PO TABS
10.0000 mg | ORAL_TABLET | Freq: Every day | ORAL | Status: DC
  Administered 2014-02-23 – 2014-02-24 (×2): 10 mg via ORAL
  Filled 2014-02-22 (×2): qty 1

## 2014-02-22 MED ORDER — ALPRAZOLAM 0.25 MG PO TABS: 0.2500 mg | ORAL_TABLET | Freq: Three times a day (TID) | ORAL | Status: DC | PRN

## 2014-02-22 MED ORDER — HYDROCODONE-ACETAMINOPHEN 5-325 MG PO TABS: 1.0000 | ORAL_TABLET | ORAL | Status: DC | PRN

## 2014-02-22 MED ORDER — LEVOTHYROXINE SODIUM 50 MCG PO TABS
50.0000 ug | ORAL_TABLET | Freq: Every day | ORAL | Status: DC
  Administered 2014-02-22 – 2014-02-24 (×3): 50 ug via ORAL
  Filled 2014-02-22 (×5): qty 1

## 2014-02-22 NOTE — Progress Notes (Signed)
Dr. Thedore MinsSingh aware of pt's BP being low 88/50, ordered bolus.

## 2014-02-22 NOTE — Progress Notes (Signed)
Subjective: 1 Day Post-Op Procedure(s) (LRB): INTRAMEDULLARY (IM) NAIL INTERTROCHANTRIC (Right) Patient reports pain as 4 on 0-10 scale.    Objective: Vital signs in last 24 hours: Temp:  [98.1 F (36.7 C)-100.4 F (38 C)] 98.8 F (37.1 C) (07/11 0648) Pulse Rate:  [66-96] 96 (07/11 1045) Resp:  [14-18] 16 (07/11 1221) BP: (105-139)/(45-92) 122/57 mmHg (07/11 1045) SpO2:  [93 %-100 %] 93 % (07/11 1221)  Intake/Output from previous day: 07/10 0701 - 07/11 0700 In: 1250 [I.V.:1250] Out: 2150 [Urine:2075; Blood:75] Intake/Output this shift: Total I/O In: 230 [I.V.:180; IV Piggyback:50] Out: -    Recent Labs  02/20/14 1749 02/21/14 0630 02/21/14 2330 02/22/14 0558  HGB 12.4 11.3* 10.1* 9.8*    Recent Labs  02/21/14 2330 02/22/14 0558  WBC 10.1 11.1*  RBC 3.18* 3.13*  HCT 29.2* 28.5*  PLT 134* 130*    Recent Labs  02/21/14 0630 02/21/14 2330 02/22/14 0558  NA 135*  --  133*  K 4.1  --  4.5  CL 100  --  97  CO2 23  --  21  BUN 12  --  9  CREATININE 0.68 0.65 0.65  GLUCOSE 105*  --  108*  CALCIUM 8.7  --  8.3*   No results found for this basename: LABPT, INR,  in the last 72 hours  ABD soft Neurovascular intact Sensation intact distally Dorsiflexion/Plantar flexion intact Incision: dressing C/D/I  Assessment/Plan: 1 Day Post-Op Procedure(s) (LRB): INTRAMEDULLARY (IM) NAIL INTERTROCHANTRIC (Right) Up with therapy Restart IV fluids until patient is not nauseated and taking po's well  Megan Glover J 02/22/2014, 12:35 PM

## 2014-02-22 NOTE — Progress Notes (Signed)
Pt continues to c/o nausea. Dr. Thedore MinsSingh aware and ordered additional dose of Zofran even though <6 hrs. Zofran to be given within 3 hrs of previous does per Dr. Thedore MinsSingh.

## 2014-02-22 NOTE — Evaluation (Signed)
Physical Therapy Evaluation Patient Details Name: Megan Glover MRN: [id number] DOB: 09/05/1945 Today's Date: 02/22/2014   History of Present Illness  Patient admitted s/p trip over hitch on trailer in which she fell and sustained a right hip fracture.  Underwent ORIF on 02/21/2014.  Clinical Impression  Patient limited today by dizziness and nauseousness, however, did well with mobility.  Anticipate steady progress with PT and ability to discharge home either directly or after Rehab stay, depending on progress.   Will benefit from PT to progress mobility and increase independence.    Follow Up Recommendations CIR    Equipment Recommendations  Rolling walker with 5" wheels    Recommendations for Other Services       Precautions / Restrictions Precautions Precautions: Fall Restrictions Weight Bearing Restrictions: Yes RLE Weight Bearing: Weight bearing as tolerated      Mobility  Bed Mobility               General bed mobility comments: patient OOB upon arrival  Transfers Overall transfer level: Needs assistance Equipment used: Rolling walker (2 wheeled) Transfers: Sit to/from Stand Sit to Stand: Mod assist;+2 physical assistance            Ambulation/Gait Ambulation/Gait assistance: Mod assist;+2 safety/equipment Ambulation Distance (Feet): 10 Feet Assistive device: Rolling walker (2 wheeled) Gait Pattern/deviations: Step-to pattern   Gait velocity interpretation: at or above normal speed for age/gender General Gait Details: patient somewhat shaky during gait; also c/o dizziness and nauseousness limiting ambulation  Stairs            Wheelchair Mobility    Modified Rankin (Stroke Patients Only)       Balance Overall balance assessment: Needs assistance         Standing balance support: Bilateral upper extremity supported Standing balance-Leahy Scale: Poor                               Pertinent Vitals/Pain Patient  reports some pain right leg during weight bearing, monitored during session.    Home Living Family/patient expects to be discharged to:: Private residence Living Arrangements: Spouse/significant other Available Help at Discharge: Family;Available 24 hours/day Type of Home: House Home Access: Stairs to enter Entrance Stairs-Rails: None Entrance Stairs-Number of Steps: 3 Home Layout: One level Home Equipment: None      Prior Function Level of Independence: Independent               Hand Dominance        Extremity/Trunk Assessment   Upper Extremity Assessment: Generalized weakness           Lower Extremity Assessment: Generalized weakness         Communication   Communication: No difficulties  Cognition Arousal/Alertness: Awake/alert Behavior During Therapy: WFL for tasks assessed/performed Overall Cognitive Status: Within Functional Limits for tasks assessed                      General Comments      Exercises General Exercises - Lower Extremity Ankle Circles/Pumps: AROM;Both;10 reps;Seated      Assessment/Plan    PT Assessment Patient needs continued PT services  PT Diagnosis Difficulty walking   PT Problem List Decreased activity tolerance;Decreased balance;Decreased mobility;Decreased knowledge of use of DME;Pain  PT Treatment Interventions DME instruction;Gait training;Stair training;Functional mobility training;Therapeutic activities;Therapeutic exercise;Balance training;Patient/family education   PT Goals (Current goals can be found in the Care Plan section) Acute Rehab PT  Goals Patient Stated Goal: not be nauseous PT Goal Formulation: With patient/family Time For Goal Achievement: 03/01/14 Potential to Achieve Goals: Good    Frequency Min 5X/week   Barriers to discharge        Co-evaluation               End of Session Equipment Utilized During Treatment: Gait belt Activity Tolerance: Patient limited by fatigue;Other  (comment) (limited by nauseousness) Patient left: in chair;with family/visitor present;with call bell/phone within reach           Time: 1117-1139 PT Time Calculation (min): 22 min   Charges:   PT Evaluation $Initial PT Evaluation Tier I: 1 Procedure PT Treatments $Gait Training: 8-22 mins   PT G CodesOlivia Glover:          Megan Glover M, [place]PT [phone] 02/22/2014, 11:47 AM

## 2014-02-22 NOTE — Progress Notes (Signed)
Patient Demographics  Megan Glover, is a 68 y.o. female, DOB - 04/25/1946, ZOX:[id number]  Admit date - 02/20/2014   Admitting Physician Eulas Post, MD  Outpatient Primary MD for the patient is FRIED, Doris Cheadle, MD  LOS - 2   Chief Complaint  Patient presents with  . Fall  . Leg Pain        Subjective:   Megan Glover today has, No headache, No chest pain, No abdominal pain - No Nausea, No new weakness tingling or numbness, No Cough - SOB. + R Hip pain    Assessment & Plan    1. Mechanical fall with R Femur nondisplaced intertrochanteric fracture. Orthopedics Dr. Dion Saucier on board patient post Open reduction internal fixation of the right femur on 02/21/2014. Weight bearing as tolerated per orthopedics, Lovenox for DVT prophylaxis. Monitor H&H. Initiate PT. Look for placement.    2. Fibromyalgia, Irritable bowel syndrome with constipation - stable. Supportive care.    3.HTN - on Cozaar, hydralazine and beta blocker at home at home will continue postop, currently as needed IV Lopressor and hydralazine.     4. Hypothyroidism continue home dose Synthroid.    5. Underlying anxiety and depression. Home dose benzodiazepine, BuSpar. No acute issues.      Code Status: Full Family Communication: none present  Disposition Plan: TBD   Procedures ORIF right femur on 02/21/2014   Consults  orthopedics Dr. Dion Saucier   Medications  Scheduled Meds: . ALPRAZolam  0.25-0.75 mg Oral See admin instructions  . busPIRone  15 mg Oral TID  . cholecalciferol  2,000 Units Oral Daily  . docusate sodium  100 mg Oral BID  . enoxaparin (LOVENOX) injection  40 mg Subcutaneous Q24H  . ferrous sulfate  325 mg Oral TID PC  . hydrALAZINE  10 mg Oral TID  . levothyroxine  50 mcg Oral QAC  breakfast  . losartan  100 mg Oral Daily  . magnesium oxide  400 mg Oral Daily  . nebivolol  20 mg Oral Daily  . senna  1 tablet Oral BID   Continuous Infusions:   PRN Meds:.acetaminophen, albuterol, alum & mag hydroxide-simeth, bisacodyl, clonazePAM, cyclobenzaprine, hydrALAZINE, HYDROcodone-acetaminophen, metoprolol, morphine injection, ondansetron (ZOFRAN) IV, oxyCODONE, polyethylene glycol  DVT Prophylaxis  SCD  Lab Results  Component Value Date   PLT 130* 02/22/2014    Antibiotics    Anti-infectives   Start     Dose/Rate Route Frequency Ordered Stop   02/21/14 2330  ceFAZolin (ANCEF) IVPB 2 g/50 mL premix     2 g 100 mL/hr over 30 Minutes Intravenous Every 6 hours 02/21/14 2210 02/22/14 0836   02/21/14 0600  ceFAZolin (ANCEF) IVPB 2 g/50 mL premix     2 g 100 mL/hr over 30 Minutes Intravenous On call to O.R. 02/20/14 2322 02/21/14 1319          Objective:   Filed Vitals:   02/22/14 0159 02/22/14 0417 02/22/14 0648 02/22/14 0743  BP: 105/56  124/62   Pulse: 81  82   Temp: 100.4 F (38 C) 99.5 F (37.5 C) 98.8 F (37.1 C)   TempSrc: Oral Oral Oral   Resp: 16  18 16   SpO2: 98%  98% 98%    Wt Readings  from Last 3 Encounters:  01/30/14 72.576 kg (160 lb)  12/10/13 71.668 kg (158 lb)  09/13/13 72.1 kg (158 lb 15.2 oz)     Intake/Output Summary (Last 24 hours) at 02/22/14 0928 Last data filed at 02/22/14 0924  Gross per 24 hour  Intake   1480 ml  Output   2150 ml  Net   -670 ml     Physical Exam  Awake Alert, Oriented X 3, No new F.N deficits, Normal affect [place].AT,PERRAL Supple Neck,No JVD, No cervical lymphadenopathy appriciated.  Symmetrical Chest wall movement, Good air movement bilaterally, CTAB RRR,No Gallops,Rubs or new Murmurs, No Parasternal Heave +ve B.Sounds, Abd Soft, No tenderness, No organomegaly appriciated, No rebound - guarding or rigidity. No Cyanosis, Clubbing or edema, No new Rash or bruise    Data Review   Micro  Results Recent Results (from the past 240 hour(s))  SURGICAL PCR SCREEN     Status: Abnormal   Collection Time    02/21/14  5:57 AM      Result Value Ref Range Status   MRSA, PCR NEGATIVE  NEGATIVE Final   Staphylococcus aureus POSITIVE (*) NEGATIVE Final   Comment:            The Xpert SA Assay (FDA     approved for NASAL specimens     in patients over 69 years of age),     is one component of     a comprehensive surveillance     program.  Test performance has     been validated by [facility] for patients greater     than or equal to 69 year old.     It is not intended     to diagnose infection nor to     guide or monitor treatment.    Radiology Reports Dg Hip Bilateral W/pelvis  02/20/2014   CLINICAL DATA:  Fall  EXAM: BILATERAL HIP WITH PELVIS - 4+ VIEW  COMPARISON:  None.  FINDINGS: Nondisplaced intertrochanteric fracture right femur. Right hip joint is normal.  Negative for left hip fracture. Benign sclerotic lesion in the intertrochanteric region. Left hip joint is normal. No pelvic fracture  IMPRESSION: Nondisplaced intertrochanteric fracture right femur.   Electronically Signed   By: Marlan Palauharles  Clark M.D.   On: 02/20/2014 17:23   Dg Knee 1-2 Views Right  02/20/2014   CLINICAL DATA:  Fall, pain  EXAM: RIGHT KNEE - 1-2 VIEW  COMPARISON:  None.  FINDINGS: There is no evidence of fracture, dislocation, or joint effusion. There is no evidence of arthropathy or other focal bone abnormality. Soft tissues are unremarkable.  IMPRESSION: Negative.   Electronically Signed   By: Marlan Palauharles  Clark M.D.   On: 02/20/2014 17:22   Dg Chest Portable 1 View  02/21/2014   CLINICAL DATA:  Preop for hip surgery.  EXAM: PORTABLE CHEST - 1 VIEW  COMPARISON:  Two-view chest 09/12/2013.  FINDINGS: Heart size is normal. Minimal bibasilar scarring is stable. The lungs are otherwise clear. Atherosclerotic calcifications are noted at the aortic arch. The visualized soft tissues and bony thorax are  unremarkable.  IMPRESSION: 1. No acute cardiopulmonary disease or significant interval change. 2. Atherosclerosis.   Electronically Signed   By: Gennette Pachris  Mattern M.D.   On: 02/21/2014 08:01    CBC  Recent Labs Lab 02/20/14 1749 02/21/14 0630 02/21/14 2330 02/22/14 0558  WBC 12.5* 9.9 10.1 11.1*  HGB 12.4 11.3* 10.1* 9.8*  HCT 35.5* 33.1* 29.2* 28.5*  PLT 202 168 134* 130*  MCV 90.6 91.7 91.8 91.1  MCH 31.6 31.3 31.8 31.3  MCHC 34.9 34.1 34.6 34.4  RDW 12.4 12.6 12.6 12.7  LYMPHSABS 1.1  --   --   --   MONOABS 0.9  --   --   --   EOSABS 0.0  --   --   --   BASOSABS 0.0  --   --   --     Chemistries   Recent Labs Lab 02/20/14 1749 02/21/14 0630 02/21/14 2330 02/22/14 0558  NA 136* 135*  --  133*  K 4.5 4.1  --  4.5  CL 99 100  --  97  CO2 25 23  --  21  GLUCOSE 97 105*  --  108*  BUN 15 12  --  9  CREATININE 0.74 0.68 0.65 0.65  CALCIUM 9.5 8.7  --  8.3*   ------------------------------------------------------------------------------------------------------------------ CrCl is unknown because both a height and weight (above a minimum accepted value) are required for this calculation. ------------------------------------------------------------------------------------------------------------------ No results found for this basename: HGBA1C,  in the last 72 hours ------------------------------------------------------------------------------------------------------------------ No results found for this basename: CHOL, HDL, LDLCALC, TRIG, CHOLHDL, LDLDIRECT,  in the last 72 hours ------------------------------------------------------------------------------------------------------------------ No results found for this basename: TSH, T4TOTAL, FREET3, T3FREE, THYROIDAB,  in the last 72 hours ------------------------------------------------------------------------------------------------------------------ No results found for this basename: VITAMINB12, FOLATE, FERRITIN, TIBC,  IRON, RETICCTPCT,  in the last 72 hours  Coagulation profile No results found for this basename: INR, PROTIME,  in the last 168 hours  No results found for this basename: DDIMER,  in the last 72 hours  Cardiac Enzymes No results found for this basename: CK, CKMB, TROPONINI, MYOGLOBIN,  in the last 168 hours ------------------------------------------------------------------------------------------------------------------ No components found with this basename: POCBNP,      Time Spent in minutes  35   Susa Raring K M.D on 02/22/2014 at 9:28 AM  Between 7am to 7pm - Pager - [phone]  After 7pm go to [email or web address] - password [email or web address]  And look for the night coverage person covering for me after hours  [facility] Office  [phone]   **Disclaimer: This note may have been dictated with voice recognition software. Similar sounding words can inadvertently be transcribed and this note may contain transcription errors which may not have been corrected upon publication of note.**

## 2014-02-23 LAB — CBC
HCT: 25.9 % — ABNORMAL LOW (ref 36.0–46.0)
Hemoglobin: 9 g/dL — ABNORMAL LOW (ref 12.0–15.0)
MCH: 31.4 pg (ref 26.0–34.0)
MCHC: 34.7 g/dL (ref 30.0–36.0)
MCV: 90.2 fL (ref 78.0–100.0)
Platelets: 124 10*3/uL — ABNORMAL LOW (ref 150–400)
RBC: 2.87 MIL/uL — AB (ref 3.87–5.11)
RDW: 12.3 % (ref 11.5–15.5)
WBC: 11.4 10*3/uL — AB (ref 4.0–10.5)

## 2014-02-23 MED ORDER — LOPERAMIDE HCL 2 MG PO CAPS
2.0000 mg | ORAL_CAPSULE | Freq: Once | ORAL | Status: AC
  Administered 2014-02-23: 2 mg via ORAL
  Filled 2014-02-23: qty 1

## 2014-02-23 MED ORDER — ACETAMINOPHEN 325 MG PO TABS: 650.0000 mg | ORAL_TABLET | Freq: Three times a day (TID) | ORAL | Status: DC | PRN

## 2014-02-23 MED ORDER — LOPERAMIDE HCL 2 MG PO CAPS: 2.0000 mg | ORAL_CAPSULE | ORAL | Status: DC | PRN

## 2014-02-23 MED ORDER — FERROUS SULFATE 325 (65 FE) MG PO TABS
325.0000 mg | ORAL_TABLET | Freq: Two times a day (BID) | ORAL | Status: DC
Start: 2014-02-23 — End: 2014-02-24
  Filled 2014-02-23 (×5): qty 1

## 2014-02-23 MED ORDER — POLYETHYLENE GLYCOL 3350 17 G PO PACK
17.0000 g | PACK | Freq: Every day | ORAL | Status: DC
  Administered 2014-02-23: 17 g via ORAL
  Filled 2014-02-23 (×3): qty 1

## 2014-02-23 MED ORDER — ACETAMINOPHEN 500 MG PO TABS
1000.0000 mg | ORAL_TABLET | Freq: Three times a day (TID) | ORAL | Status: DC
  Administered 2014-02-23 – 2014-02-24 (×4): 1000 mg via ORAL
  Filled 2014-02-23 (×6): qty 2

## 2014-02-23 NOTE — Progress Notes (Signed)
PATIENT DETAILS Name: Megan Glover Age: 68 y.o. Sex: female Date of Birth: Jan 04, 1946 Admit Date: 02/20/2014 Admitting Physician Eulas Post, MD ZOX:[id number], Doris Cheadle, MD  Subjective: Feels better, no nausea this am  Assessment/Plan: Principal Problem:   Closed right hip fracture -secondary to a mechanical fall -Open reduction internal fixation of the right femur on 02/21/2014 -Weight bearing as tolerated per orthopedics, Lovenox for DVT prophylaxis, await PT eval, suspect will need SNF. -minimize narcotics, for will schedule Tylenol. Continue bowel regimen with Senokot and Miralax  Active Problems: Nausea -secondary to narcotics -resolved with supportive care  HTN -cautiously continue with Cozaar and Bystolic  Anemia -suspect secondary to perioperative blood loss, IVF dilution -monitor Hb periodically -c/w Feso4   Fibromyalgia -stable with supportive care   Irritable bowel syndrome with constipation -stable, continue with bowel regimen  Hypothyroidism  -continue home dose Synthroid.  Underlying anxiety and depression -Home dose benzodiazepine, BuSpar. No acute issues.  Disposition: Remain inpatient  DVT Prophylaxis: Prophylactic Lovenox   Code Status: Full code   Family Communication Husband at bedside  Procedures: Open reduction internal fixation of the right femur on 02/21/2014  CONSULTS:  orthopedic surgery  Time spent 40 minutes-which includes 50% of the time with face-to-face with patient/ family and coordinating care related to the above assessment and plan.    MEDICATIONS: Scheduled Meds: . acetaminophen  1,000 mg Oral TID  . busPIRone  15 mg Oral TID  . cholecalciferol  2,000 Units Oral Daily  . docusate sodium  100 mg Oral BID  . enoxaparin (LOVENOX) injection  40 mg Subcutaneous Q24H  . ferrous sulfate  325 mg Oral BID WC  . levothyroxine  50 mcg Oral QAC breakfast  . losartan  25 mg Oral Daily  . magnesium oxide   400 mg Oral Daily  . nebivolol  10 mg Oral Daily  . polyethylene glycol  17 g Oral Daily  . senna  1 tablet Oral BID   Continuous Infusions:  PRN Meds:.acetaminophen, albuterol, ALPRAZolam, alum & mag hydroxide-simeth, bisacodyl, clonazePAM, cyclobenzaprine, hydrALAZINE, metoprolol, morphine injection, ondansetron (ZOFRAN) IV, oxyCODONE  Antibiotics: Anti-infectives   Start     Dose/Rate Route Frequency Ordered Stop   02/21/14 2330  ceFAZolin (ANCEF) IVPB 2 g/50 mL premix     2 g 100 mL/hr over 30 Minutes Intravenous Every 6 hours 02/21/14 2210 02/22/14 0836   02/21/14 0600  ceFAZolin (ANCEF) IVPB 2 g/50 mL premix     2 g 100 mL/hr over 30 Minutes Intravenous On call to O.R. 02/20/14 2322 02/21/14 1319       PHYSICAL EXAM: Vital signs in last 24 hours: Filed Vitals:   02/23/14 0000 02/23/14 0509 02/23/14 0826 02/23/14 0922  BP:  136/52  109/39  Pulse:  73  67  Temp:  97.7 F (36.5 C)    TempSrc:  Oral    Resp: 18 20 18 18   SpO2: 95% 93% 94%     Weight change:  There were no vitals filed for this visit. There is no weight on file to calculate BMI.   Gen Exam: Awake and alert with clear speech.   Neck: Supple, No JVD.   Chest: B/L Clear.   CVS: S1 S2 Regular, no murmurs.  Abdomen: soft, BS +, non tender, non distended. Extremities: no edema, lower extremities warm to touch. Neurologic: Non Focal.   Skin: No Rash.   Wounds: N/A.    Intake/Output from previous day:  Intake/Output  Summary (Last 24 hours) at 02/23/14 1055 Last data filed at 02/23/14 0935  Gross per 24 hour  Intake 3041.67 ml  Output    700 ml  Net 2341.67 ml     LAB RESULTS: CBC  Recent Labs Lab 02/20/14 1749 02/21/14 0630 02/21/14 2330 02/22/14 0558 02/23/14 0638  WBC 12.5* 9.9 10.1 11.1* 11.4*  HGB 12.4 11.3* 10.1* 9.8* 9.0*  HCT 35.5* 33.1* 29.2* 28.5* 25.9*  PLT 202 168 134* 130* 124*  MCV 90.6 91.7 91.8 91.1 90.2  MCH 31.6 31.3 31.8 31.3 31.4  MCHC 34.9 34.1 34.6 34.4 34.7    RDW 12.4 12.6 12.6 12.7 12.3  LYMPHSABS 1.1  --   --   --   --   MONOABS 0.9  --   --   --   --   EOSABS 0.0  --   --   --   --   BASOSABS 0.0  --   --   --   --     Chemistries   Recent Labs Lab 02/20/14 1749 02/21/14 0630 02/21/14 2330 02/22/14 0558  NA 136* 135*  --  133*  K 4.5 4.1  --  4.5  CL 99 100  --  97  CO2 25 23  --  21  GLUCOSE 97 105*  --  108*  BUN 15 12  --  9  CREATININE 0.74 0.68 0.65 0.65  CALCIUM 9.5 8.7  --  8.3*    CBG: No results found for this basename: GLUCAP,  in the last 168 hours  GFR The CrCl is unknown because both a height and weight (above a minimum accepted value) are required for this calculation.  Coagulation profile No results found for this basename: INR, PROTIME,  in the last 168 hours  Cardiac Enzymes No results found for this basename: CK, CKMB, TROPONINI, MYOGLOBIN,  in the last 168 hours  No components found with this basename: POCBNP,  No results found for this basename: DDIMER,  in the last 72 hours No results found for this basename: HGBA1C,  in the last 72 hours No results found for this basename: CHOL, HDL, LDLCALC, TRIG, CHOLHDL, LDLDIRECT,  in the last 72 hours No results found for this basename: TSH, T4TOTAL, FREET3, T3FREE, THYROIDAB,  in the last 72 hours No results found for this basename: VITAMINB12, FOLATE, FERRITIN, TIBC, IRON, RETICCTPCT,  in the last 72 hours No results found for this basename: LIPASE, AMYLASE,  in the last 72 hours  Urine Studies No results found for this basename: UACOL, UAPR, USPG, UPH, UTP, UGL, UKET, UBIL, UHGB, UNIT, UROB, ULEU, UEPI, UWBC, URBC, UBAC, CAST, CRYS, UCOM, BILUA,  in the last 72 hours  MICROBIOLOGY: Recent Results (from the past 240 hour(s))  SURGICAL PCR SCREEN     Status: Abnormal   Collection Time    02/21/14  5:57 AM      Result Value Ref Range Status   MRSA, PCR NEGATIVE  NEGATIVE Final   Staphylococcus aureus POSITIVE (*) NEGATIVE Final   Comment:             The Xpert SA Assay (FDA     approved for NASAL specimens     in patients over 69 years of age),     is one component of     a comprehensive surveillance     program.  Test performance has     been validated by [facility] for patients greater  than or equal to 40 year old.     It is not intended     to diagnose infection nor to     guide or monitor treatment.    RADIOLOGY STUDIES/RESULTS: Dg Hip Bilateral W/pelvis  02/20/2014   CLINICAL DATA:  Fall  EXAM: BILATERAL HIP WITH PELVIS - 4+ VIEW  COMPARISON:  None.  FINDINGS: Nondisplaced intertrochanteric fracture right femur. Right hip joint is normal.  Negative for left hip fracture. Benign sclerotic lesion in the intertrochanteric region. Left hip joint is normal. No pelvic fracture  IMPRESSION: Nondisplaced intertrochanteric fracture right femur.   Electronically Signed   By: Marlan Palau M.D.   On: 02/20/2014 17:23   Dg Femur Right  02/21/2014   CLINICAL DATA:  Hip fracture.  EXAM: DG C-ARM 61-120 MIN; RIGHT FEMUR - 2 VIEW  FLUOROSCOPY TIME:  1 min and 16 seconds.  COMPARISON:  None.  FINDINGS: Status post placement of intramedullary nail for intertrochanteric fracture.  IMPRESSION: Satisfactory position and alignment.   Electronically Signed   By: Davonna Belling M.D.   On: 02/21/2014 20:17   Dg Knee 1-2 Views Right  02/20/2014   CLINICAL DATA:  Fall, pain  EXAM: RIGHT KNEE - 1-2 VIEW  COMPARISON:  None.  FINDINGS: There is no evidence of fracture, dislocation, or joint effusion. There is no evidence of arthropathy or other focal bone abnormality. Soft tissues are unremarkable.  IMPRESSION: Negative.   Electronically Signed   By: Marlan Palau M.D.   On: 02/20/2014 17:22   Dg Pelvis Portable  02/22/2014   CLINICAL DATA:  Postop.  EXAM: PORTABLE PELVIS 1-2 VIEWS; PORTABLE RIGHT HIP - 1 VIEW  COMPARISON:  Fluoroscopy 02/21/2014  FINDINGS: Long intra medullary rod and compression screw fixation of a fracture of the intertrochanteric  region and base of the right femoral neck. Alignment and position are unchanged since previous intraoperative fluoroscopy. Soft tissue gas collections consistent with recent surgery. Mild degenerative changes in the hips. Sclerosis in the proximal left femur likely represents enchondroma or bone island.  IMPRESSION: Internal fixation of right intertrochanteric hip fracture.   Electronically Signed   By: Burman Nieves M.D.   On: 02/22/2014 01:45   Dg Chest Portable 1 View  02/21/2014   CLINICAL DATA:  Preop for hip surgery.  EXAM: PORTABLE CHEST - 1 VIEW  COMPARISON:  Two-view chest 09/12/2013.  FINDINGS: Heart size is normal. Minimal bibasilar scarring is stable. The lungs are otherwise clear. Atherosclerotic calcifications are noted at the aortic arch. The visualized soft tissues and bony thorax are unremarkable.  IMPRESSION: 1. No acute cardiopulmonary disease or significant interval change. 2. Atherosclerosis.   Electronically Signed   By: Gennette Pac M.D.   On: 02/21/2014 08:01   Dg Hip Portable 1 View Right  02/22/2014   CLINICAL DATA:  Postop.  EXAM: PORTABLE PELVIS 1-2 VIEWS; PORTABLE RIGHT HIP - 1 VIEW  COMPARISON:  Fluoroscopy 02/21/2014  FINDINGS: Long intra medullary rod and compression screw fixation of a fracture of the intertrochanteric region and base of the right femoral neck. Alignment and position are unchanged since previous intraoperative fluoroscopy. Soft tissue gas collections consistent with recent surgery. Mild degenerative changes in the hips. Sclerosis in the proximal left femur likely represents enchondroma or bone island.  IMPRESSION: Internal fixation of right intertrochanteric hip fracture.   Electronically Signed   By: Burman Nieves M.D.   On: 02/22/2014 01:45   Dg C-arm 1-60 Min  02/21/2014   CLINICAL DATA:  Hip fracture.  EXAM: DG C-ARM 61-120 MIN; RIGHT FEMUR - 2 VIEW  FLUOROSCOPY TIME:  1 min and 16 seconds.  COMPARISON:  None.  FINDINGS: Status post placement of  intramedullary nail for intertrochanteric fracture.  IMPRESSION: Satisfactory position and alignment.   Electronically Signed   By: Davonna Belling M.D.   On: 02/21/2014 20:17    Jeoffrey Massed, MD  [facility] Pager:[phone]  If 7PM-7AM, please contact night-coverage [email or web address] Password [email or web address] 02/23/2014, 10:55 AM   LOS: 3 days   **Disclaimer: This note may have been dictated with voice recognition software. Similar sounding words can inadvertently be transcribed and this note may contain transcription errors which may not have been corrected upon publication of note.**

## 2014-02-23 NOTE — Progress Notes (Signed)
Physical Therapy Treatment Patient Details Name: Megan Glover MRN: [id number] DOB: October 23, 1945 Today's Date: 02/23/2014    History of Present Illness Patient admitted s/p trip over hitch on trailer in which she fell and sustained a right hip fracture.  Underwent ORIF on 02/21/2014.    PT Comments    Excellent progress between initial PT eval done yesterday and today; Lengthy discussion re: dc planning and qualifiers for CIR, and options for home with HHPT or SNF if CIR does not take her  Follow Up Recommendations  Supervision/Assistance - 24 hour;Other (comment) (It is possible that Megan Glover is at too high a functional level to qualify for CIR; and I'm not sure that she meets the medical complexity piece -- she did report that she needs surgery for a double hernia)     Equipment Recommendations  Rolling walker with 5" wheels;3in1 (PT)    Recommendations for Other Services       Precautions / Restrictions Precautions Precautions: Fall Restrictions RLE Weight Bearing: Weight bearing as tolerated    Mobility  Bed Mobility Overal bed mobility: Needs Assistance Bed Mobility: Sit to Supine       Sit to supine: Min assist   General bed mobility comments: Min assist for RLE  Transfers Overall transfer level: Needs assistance Equipment used: Rolling walker (2 wheeled) Transfers: Sit to/from Stand Sit to Stand: Min guard         General transfer comment: Cues and supervision for safety; Very nice rise from chair  Ambulation/Gait Ambulation/Gait assistance: Min guard Ambulation Distance (Feet): 100 Feet Assistive device: Rolling walker (2 wheeled) Gait Pattern/deviations: Step-through pattern Gait velocity: slowed   General Gait Details: Cues to bear weight into RW to [employer] painful RLE as necessary   Stairs            Wheelchair Mobility    Modified Rankin (Stroke Patients Only)       Balance                                     Cognition Arousal/Alertness: Awake/alert Behavior During Therapy: WFL for tasks assessed/performed Overall Cognitive Status: Within Functional Limits for tasks assessed                      Exercises General Exercises - Lower Extremity Ankle Circles/Pumps: AROM;Both;10 reps Quad Sets: AROM;Right;20 reps Gluteal Sets: AROM;Both;10 reps Heel Slides: AROM;Right;10 reps Hip ABduction/ADduction: AROM;Right;10 reps    General Comments        Pertinent Vitals/Pain 3/10 R hip patient repositioned for comfort     Home Living                      Prior Function            PT Goals (current goals can now be found in the care plan section) Acute Rehab PT Goals PT Goal Formulation: With patient/family Time For Goal Achievement: 03/01/14 Potential to Achieve Goals: Good Progress towards PT goals: Progressing toward goals    Frequency  Min 5X/week    PT Plan Current plan remains appropriate;Other (comment) (Not sure that pt will qualify for CIR)    Co-evaluation             End of Session Equipment Utilized During Treatment: Gait belt Activity Tolerance: Patient tolerated treatment well Patient left: in bed;with call bell/phone within reach;with family/visitor present  Time: [id number] PT Time Calculation (min): 39 min  Charges:  $Gait Training: 8-22 mins $Therapeutic Exercise: 8-22 mins $Therapeutic Activity: 8-22 mins                    G Codes:      Megan Glover [facility] 02/23/2014, 6:17 PM Megan Glover, [place]  Acute Rehabilitation Services Pager [phone] Office [phone]

## 2014-02-23 NOTE — Progress Notes (Addendum)
Clinical Social Work Department CLINICAL SOCIAL WORK PLACEMENT NOTE 02/23/2014  Patient:  Megan Glover,Megan Glover  Account Number:  [id number] Admit date:  02/20/2014  Clinical Social Worker:  Jetta LoutBAILEY MORGAN, Theresia MajorsLCSWA  Date/time:  02/23/2014 06:43 PM  Clinical Social Work is seeking post-discharge placement for this patient at the following level of care:   SKILLED NURSING   (*CSW will update this form in Epic as items are completed)   02/23/2014  Patient/family provided with [facility] of Clinical [facility] Work's list of facilities offering this level of care within the geographic area requested by the patient (or if unable, by the patient's family).  02/23/2014  Patient/family informed of their freedom to choose among providers that offer the needed level of care, that participate in Medicare, Medicaid or managed care program needed by the patient, have an available bed and are willing to accept the patient.  02/23/2014  Patient/family informed of [facility]' ownership interest in [facility], as well as of the fact that they are under no obligation to receive care at this facility.  PASARR submitted to EDS on 02/23/2014 PASARR number received on 02/23/2014  FL2 transmitted to all facilities in geographic area requested by pt/family on  02/23/2014 FL2 transmitted to all facilities within larger geographic area on   Patient informed that his/her managed care company has contracts with or will negotiate with  certain facilities, including the following:     Patient/family informed of bed offers received:  02/24/2014 [facility] Physician recommends and patient chooses bed at    [facility] on  02/24/2014 Patient to be transferred to facility by [facility] Patient and family notified of transfer on 02/24/2014 Name of family member notified:  Nancie NeasStewart Jamar  The following physician request were entered in  Epic:   Additional Comments:

## 2014-02-23 NOTE — Progress Notes (Signed)
Subjective: 2 Days Post-Op Procedure(s) (LRB): INTRAMEDULLARY (IM) NAIL INTERTROCHANTRIC (Right) Patient reports pain as 3 on 0-10 scale.    Objective: Vital signs in last 24 hours: Temp:  [97.7 F (36.5 C)-98.5 F (36.9 C)] 97.7 F (36.5 C) (07/12 0509) Pulse Rate:  [67-110] 67 (07/12 0922) Resp:  [16-20] 16 (07/12 1134) BP: (86-136)/(39-52) 109/39 mmHg (07/12 0922) SpO2:  [93 %-99 %] 93 % (07/12 1134)  Intake/Output from previous day: 07/11 0701 - 07/12 0700 In: 2653.3 [P.O.:720; I.V.:1383.3; IV Piggyback:550] Out: 700 [Urine:700] Intake/Output this shift: Total I/O In: 618.3 [P.O.:360; I.V.:258.3] Out: -    Recent Labs  02/20/14 1749 02/21/14 0630 02/21/14 2330 02/22/14 0558 02/23/14 0638  HGB 12.4 11.3* 10.1* 9.8* 9.0*    Recent Labs  02/22/14 0558 02/23/14 0638  WBC 11.1* 11.4*  RBC 3.13* 2.87*  HCT 28.5* 25.9*  PLT 130* 124*    Recent Labs  02/21/14 0630 02/21/14 2330 02/22/14 0558  NA 135*  --  133*  K 4.1  --  4.5  CL 100  --  97  CO2 23  --  21  BUN 12  --  9  CREATININE 0.68 0.65 0.65  GLUCOSE 105*  --  108*  CALCIUM 8.7  --  8.3*   No results found for this basename: LABPT, INR,  in the last 72 hours  Neurovascular intact Sensation intact distally Intact pulses distally Incision: dressing C/D/I and scant drainage Abdomen distended.  C/O constipation  Will double miralax dosage  Assessment/Plan: 2 Days Post-Op Procedure(s) (LRB): INTRAMEDULLARY (IM) NAIL INTERTROCHANTRIC (Right) Advance diet Up with therapy Discharge to SNF vs inpatient rehab Megan Glover will be back tomorrow  Megan LuxSHEPPERSON,Megan Glover 02/23/2014, 12:01 PM

## 2014-02-23 NOTE — Progress Notes (Signed)
Occupational Therapy Evaluation Patient Details Name: Megan Glover MRN: [id number] DOB: [birth year] Today's Date: 02/23/2014    History of Present Illness Patient admitted s/p trip over hitch on trailer in which she fell and sustained a right hip fracture.  Underwent ORIF on 02/21/2014.   Clinical Impression   PTA pt lived at home and was independent with ADLs. Pt making significant progress with functional mobility today, however still interested in pursuing CIR. Pt requires assist for LB ADLs due to pain and mobility issues. Pt able to ambulate around room with min guard. Education and training completed for compensatory techniques for LB ADLs and safety with use of DME to decrease fall risk. Pt would benefit from continued skilled OT to increase safety and independence with ADLs.     Follow Up Recommendations  Supervision/Assistance - 24 hour; pt would like to pursue CIR at this time and will determine plan of care if not accepted   Equipment Recommendations  3 in 1 bedside comode       Precautions / Restrictions Precautions Precautions: Fall Restrictions Weight Bearing Restrictions: Yes RLE Weight Bearing: Weight bearing as tolerated      Mobility Bed Mobility Overal bed mobility: Needs Assistance Bed Mobility: Sit to Supine       Sit to supine: Min guard;HOB elevated   General bed mobility comments: Pt able to manage OOB with no physical assist.   Transfers Overall transfer level: Needs assistance Equipment used: Rolling walker (2 wheeled) Transfers: Sit to/from Stand Sit to Stand: Min guard         General transfer comment: VC's for hand placement and safety         ADL Overall ADL's : Needs assistance/impaired Eating/Feeding: Independent;Sitting   Grooming: Min guard;Standing   Upper Body Bathing: Set up;Sitting   Lower Body Bathing: Minimal assistance;Sit to/from stand   Upper Body Dressing : Set up;Sitting   Lower Body Dressing: Sit to/from  stand;Minimal assistance   Toilet Transfer: Min guard;Ambulation;RW;Comfort height toilet   Toileting- Clothing Manipulation and Hygiene: Min guard;Sit to/from stand   Tub/ Shower Transfer: Min guard;Ambulation;Rolling walker   Functional mobility during ADLs: Min guard;Rolling walker General ADL Comments: Pt doing significantly better today, and requires min assist for LB ADLs due to pain but has husband at home to assist. Pt will likely be able to reach Bil LEs when pain is alleviated fully without difficulty.      Vision  Pt reports no change from baseline.  No apparent visual deficits.                  Perception Perception Perception Tested?: No   Praxis Praxis Praxis tested?: Within functional limits    Pertinent Vitals/Pain NAD, pt reports pain 2/10. Positioned in chair for comfort.      Hand Dominance Right   Extremity/Trunk Assessment Upper Extremity Assessment Upper Extremity Assessment: Generalized weakness   Lower Extremity Assessment Lower Extremity Assessment: Generalized weakness   Cervical / Trunk Assessment Cervical / Trunk Assessment: Normal   Communication Communication Communication: No difficulties   Cognition Arousal/Alertness: Awake/alert Behavior During Therapy: WFL for tasks assessed/performed Overall Cognitive Status: Within Functional Limits for tasks assessed                                Home Living Family/patient expects to be discharged to:: Private residence Living Arrangements: Spouse/significant other Available Help at Discharge: Family;Available 24 hours/day Type of Home: House  Home Access: Stairs to enter Entrance Stairs-Number of Steps: 3 Entrance Stairs-Rails: None Home Layout: One level     Bathroom Shower/Tub: Runner, broadcasting/film/video: None          Prior Functioning/Environment Level of Independence: Independent             OT Diagnosis: Generalized weakness;Acute  pain   OT Problem List: Decreased strength;Decreased range of motion;Decreased activity tolerance;Impaired balance (sitting and/or standing);Decreased safety awareness;Decreased knowledge of use of DME or AE;Pain   OT Treatment/Interventions: Self-care/ADL training;Therapeutic exercise;Energy conservation;DME and/or AE instruction;Therapeutic activities;Patient/family education;Balance training    OT Goals(Current goals can be found in the care plan section) Acute Rehab OT Goals Patient Stated Goal: to get better OT Goal Formulation: With patient Time For Goal Achievement: 03/02/14 Potential to Achieve Goals: Good ADL Goals Pt Will Perform Grooming: with modified independence;standing Pt Will Perform Lower Body Bathing: with modified independence;sit to/from stand Pt Will Perform Lower Body Dressing: with modified independence;sit to/from stand Pt Will Transfer to Toilet: with modified independence;ambulating;regular height toilet Pt Will Perform Tub/Shower Transfer: with modified independence;ambulating;rolling walker  OT Frequency: Min 2X/week              End of Session Equipment Utilized During Treatment: Gait belt;Rolling walker Nurse Communication: Mobility status  Activity Tolerance: Patient tolerated treatment well Patient left: in chair;with call bell/phone within reach   Time: 1342-1418 OT Time Calculation (min): 36 min Charges:  OT General Charges $OT Visit: 1 Procedure OT Evaluation $Initial OT Evaluation Tier I: 1 Procedure OT Treatments $Self Care/Home Management : 23-37 mins  Rae Lips [phone] 02/23/2014, 6:37 PM

## 2014-02-23 NOTE — Progress Notes (Signed)
Clinical Social Work Department BRIEF PSYCHOSOCIAL ASSESSMENT 02/23/2014  Patient:  Megan Glover, Megan Glover     Account Number:  [id number]     Coco date:  02/20/2014  Clinical Social Worker:  Rolinda Roan  Date/Time:  02/23/2014 06:40 PM  Referred by:  Physician  Date Referred:  02/21/2014 Referred for  SNF Placement   Other Referral:   Interview type:  Patient Other interview type:    PSYCHOSOCIAL DATA Living Status:  HUSBAND Admitted from facility:   Level of care:   Primary support name:  Georgie Chard Primary support relationship to patient:  SPOUSE Degree of support available:   Good support per patient.    CURRENT CONCERNS  Other Concerns:    SOCIAL WORK ASSESSMENT / PLAN Clinical Social Worker (CSW) met with patient to discuss D/C plan. Patient reported that she lives in [place] with her husband. Patient is agreeable to SNF search in [facility]. Patient prefers CIR. CSW explained to patient that SNF search is a back up to CIR.   Assessment/plan status:  Psychosocial Support/Ongoing Assessment of Needs Other assessment/ plan:   Information/referral to community resources:   CSW gave patient SNF list.    PATIENT'S/FAMILY'S RESPONSE TO PLAN OF CARE: Patient thanked CSW for visit and assisting with placement process.

## 2014-02-24 ENCOUNTER — Encounter (HOSPITAL_COMMUNITY): Payer: Self-pay | Admitting: Orthopedic Surgery

## 2014-02-24 DIAGNOSIS — K589 Irritable bowel syndrome without diarrhea: Secondary | ICD-10-CM

## 2014-02-24 MED ORDER — SENNA-DOCUSATE SODIUM 8.6-50 MG PO TABS: 2.0000 | ORAL_TABLET | Freq: Every day | ORAL | Status: DC

## 2014-02-24 MED ORDER — ALPRAZOLAM 0.25 MG PO TABS: 0.2500 mg | ORAL_TABLET | ORAL | Status: DC

## 2014-02-24 MED ORDER — FERROUS SULFATE 325 (65 FE) MG PO TABS: 325.0000 mg | ORAL_TABLET | Freq: Two times a day (BID) | ORAL | Status: DC

## 2014-02-24 MED ORDER — ENOXAPARIN SODIUM 40 MG/0.4ML ~~LOC~~ SOLN: 40.0000 mg | SUBCUTANEOUS | Status: DC

## 2014-02-24 MED ORDER — OXYCODONE HCL 5 MG PO TABS: 5.0000 mg | ORAL_TABLET | ORAL | Status: DC | PRN

## 2014-02-24 MED ORDER — ACETAMINOPHEN 500 MG PO TABS: 1000.0000 mg | ORAL_TABLET | Freq: Three times a day (TID) | ORAL | Status: DC | PRN

## 2014-02-24 MED ORDER — BUSPIRONE HCL 15 MG PO TABS: 15.0000 mg | ORAL_TABLET | Freq: Three times a day (TID) | ORAL | Status: DC

## 2014-02-24 NOTE — Discharge Summary (Signed)
PATIENT DETAILS Name: Megan Glover Age: 68 y.o. Sex: female Date of Birth: Feb 03, 1946 MRN: [id number]. Admit Date: 02/20/2014 Admitting Physician: Eulas Post, MD [id number], Megan Cheadle, MD  Recommendations for Outpatient Follow-up:  Please check CBC and BMET in one week. Please ensure follow up with Dr Dion Saucier  PRIMARY DISCHARGE DIAGNOSIS:  Principal Problem:   Closed right hip fracture Active Problems:   Fibromyalgia   Irritable bowel syndrome with constipation   Hypertension   Hip fracture      PAST MEDICAL HISTORY: Past Medical History  Diagnosis Date  . Hypertension   . Thyroid disease   . History of diverticulosis   . Fibromyalgia   . Hypothyroidism   . IBS (irritable bowel syndrome)   . Clostridium difficile diarrhea     DISCHARGE MEDICATIONS:   Medication List    STOP taking these medications       clonazePAM 0.5 MG tablet  Commonly known as:  KLONOPIN      TAKE these medications       acetaminophen 500 MG tablet  Commonly known as:  TYLENOL  Take 2 tablets (1,000 mg total) by mouth every 8 (eight) hours as needed for moderate pain.     albuterol 108 (90 BASE) MCG/ACT inhaler  Commonly known as:  PROVENTIL HFA;VENTOLIN HFA  Inhale 2 puffs into the lungs every 8 (eight) hours as needed for wheezing or shortness of breath.     ALPRAZolam 0.25 MG tablet  Commonly known as:  XANAX  Take 1-3 tablets (0.25-0.75 mg total) by mouth See admin instructions. Patient can take 1-3 tablets by mouth at bedtime as needed for sleep.     B-complex with vitamin C tablet  Take 1 tablet by mouth daily.     busPIRone 15 MG tablet  Commonly known as:  BUSPAR  Take 1 tablet (15 mg total) by mouth 3 (three) times daily.     cholecalciferol 1000 UNITS tablet  Commonly known as:  VITAMIN D  Take 2,000 Units by mouth daily.     cyclobenzaprine 10 MG tablet  Commonly known as:  FLEXERIL  Take 10 mg by mouth daily as needed for muscle spasms.     enoxaparin 40  MG/0.4ML injection  Commonly known as:  LOVENOX  Inject 0.4 mLs (40 mg total) into the skin daily. For 21 days     ferrous sulfate 325 (65 FE) MG tablet  Take 1 tablet (325 mg total) by mouth 2 (two) times daily with a meal.     hydrALAZINE 10 MG tablet  Commonly known as:  APRESOLINE  Take 10 mg by mouth 3 (three) times daily.     losartan 100 MG tablet  Commonly known as:  COZAAR  Take 100 mg by mouth daily.     magnesium oxide 400 MG tablet  Commonly known as:  MAG-OX  Take 400 mg by mouth daily.     nebivolol 10 MG tablet  Commonly known as:  BYSTOLIC  Take 20 mg by mouth daily. Two tablets     OVER THE COUNTER MEDICATION  Apply 2 drops to eye at bedtime as needed (for itchy dry eyes.).     oxyCODONE 5 MG immediate release tablet  Commonly known as:  Oxy IR/ROXICODONE  Take 1 tablet (5 mg total) by mouth every 4 (four) hours as needed for breakthrough pain ((for MODERATE breakthrough pain)).     PA BIOTIN 1000 MCG tablet  Generic drug:  Biotin  Take 1,000 mcg by  mouth daily.     sennosides-docusate sodium 8.6-50 MG tablet  Commonly known as:  SENOKOT-S  Take 2 tablets by mouth daily.     ZINC PO  Take 1 capsule by mouth daily.        ALLERGIES:   Allergies  Allergen Reactions  . Clindamycin/Lincomycin     CDIF  . Demerol [Meperidine] Nausea And Vomiting, Other (See Comments) and Hypertension    Severe headache  . Erythromycin Diarrhea  . Hydrochlorothiazide Other (See Comments)    Weakness/malaise  . Spironolactone Other (See Comments)    Weakness/malaise  . Sulfa Antibiotics Other (See Comments)    Anal itching    BRIEF HPI:  See H&P, Labs, Consult and Test reports for all details in brief, patient is a 68 y.o. Female with past medical history of HTN, Fibromyalgia, who was admitted for a mechanical fall and resultant right hip fracture.   CONSULTATIONS:   orthopedic surgery  PERTINENT RADIOLOGIC STUDIES: Dg Hip Bilateral W/pelvis  02/20/2014    CLINICAL DATA:  Fall  EXAM: BILATERAL HIP WITH PELVIS - 4+ VIEW  COMPARISON:  None.  FINDINGS: Nondisplaced intertrochanteric fracture right femur. Right hip joint is normal.  Negative for left hip fracture. Benign sclerotic lesion in the intertrochanteric region. Left hip joint is normal. No pelvic fracture  IMPRESSION: Nondisplaced intertrochanteric fracture right femur.   Electronically Signed   By: Marlan Palau M.D.   On: 02/20/2014 17:23   Dg Femur Right  02/21/2014   CLINICAL DATA:  Hip fracture.  EXAM: DG C-ARM 61-120 MIN; RIGHT FEMUR - 2 VIEW  FLUOROSCOPY TIME:  1 min and 16 seconds.  COMPARISON:  None.  FINDINGS: Status post placement of intramedullary nail for intertrochanteric fracture.  IMPRESSION: Satisfactory position and alignment.   Electronically Signed   By: Davonna Belling M.D.   On: 02/21/2014 20:17   Dg Knee 1-2 Views Right  02/20/2014   CLINICAL DATA:  Fall, pain  EXAM: RIGHT KNEE - 1-2 VIEW  COMPARISON:  None.  FINDINGS: There is no evidence of fracture, dislocation, or joint effusion. There is no evidence of arthropathy or other focal bone abnormality. Soft tissues are unremarkable.  IMPRESSION: Negative.   Electronically Signed   By: Marlan Palau M.D.   On: 02/20/2014 17:22   Dg Pelvis Portable  02/22/2014   CLINICAL DATA:  Postop.  EXAM: PORTABLE PELVIS 1-2 VIEWS; PORTABLE RIGHT HIP - 1 VIEW  COMPARISON:  Fluoroscopy 02/21/2014  FINDINGS: Long intra medullary rod and compression screw fixation of a fracture of the intertrochanteric region and base of the right femoral neck. Alignment and position are unchanged since previous intraoperative fluoroscopy. Soft tissue gas collections consistent with recent surgery. Mild degenerative changes in the hips. Sclerosis in the proximal left femur likely represents enchondroma or bone island.  IMPRESSION: Internal fixation of right intertrochanteric hip fracture.   Electronically Signed   By: Burman Nieves M.D.   On: 02/22/2014 01:45   Dg  Chest Portable 1 View  02/21/2014   CLINICAL DATA:  Preop for hip surgery.  EXAM: PORTABLE CHEST - 1 VIEW  COMPARISON:  Two-view chest 09/12/2013.  FINDINGS: Heart size is normal. Minimal bibasilar scarring is stable. The lungs are otherwise clear. Atherosclerotic calcifications are noted at the aortic arch. The visualized soft tissues and bony thorax are unremarkable.  IMPRESSION: 1. No acute cardiopulmonary disease or significant interval change. 2. Atherosclerosis.   Electronically Signed   By: Gennette Pac M.D.   On: 02/21/2014 08:01  Dg Hip Portable 1 View Right  02/22/2014   CLINICAL DATA:  Postop.  EXAM: PORTABLE PELVIS 1-2 VIEWS; PORTABLE RIGHT HIP - 1 VIEW  COMPARISON:  Fluoroscopy 02/21/2014  FINDINGS: Long intra medullary rod and compression screw fixation of a fracture of the intertrochanteric region and base of the right femoral neck. Alignment and position are unchanged since previous intraoperative fluoroscopy. Soft tissue gas collections consistent with recent surgery. Mild degenerative changes in the hips. Sclerosis in the proximal left femur likely represents enchondroma or bone island.  IMPRESSION: Internal fixation of right intertrochanteric hip fracture.   Electronically Signed   By: Burman Nieves M.D.   On: 02/22/2014 01:45   Dg C-arm 1-60 Min  02/21/2014   CLINICAL DATA:  Hip fracture.  EXAM: DG C-ARM 61-120 MIN; RIGHT FEMUR - 2 VIEW  FLUOROSCOPY TIME:  1 min and 16 seconds.  COMPARISON:  None.  FINDINGS: Status post placement of intramedullary nail for intertrochanteric fracture.  IMPRESSION: Satisfactory position and alignment.   Electronically Signed   By: Davonna Belling M.D.   On: 02/21/2014 20:17     PERTINENT LAB RESULTS: CBC:  Recent Labs  02/22/14 0558 02/23/14 0638  WBC 11.1* 11.4*  HGB 9.8* 9.0*  HCT 28.5* 25.9*  PLT 130* 124*   CMET CMP     Component Value Date/Time   NA 133* 02/22/2014 0558   K 4.5 02/22/2014 0558   CL 97 02/22/2014 0558   CO2 21  02/22/2014 0558   GLUCOSE 108* 02/22/2014 0558   BUN 9 02/22/2014 0558   CREATININE 0.65 02/22/2014 0558   CALCIUM 8.3* 02/22/2014 0558   PROT 7.6 09/12/2013 1647   ALBUMIN 4.1 09/12/2013 1647   AST 29 09/12/2013 1647   ALT 21 09/12/2013 1647   ALKPHOS 66 09/12/2013 1647   BILITOT 0.5 09/12/2013 1647   GFRNONAA 89* 02/22/2014 0558   GFRAA >90 02/22/2014 0558    GFR The CrCl is unknown because both a height and weight (above a minimum accepted value) are required for this calculation. No results found for this basename: LIPASE, AMYLASE,  in the last 72 hours No results found for this basename: CKTOTAL, CKMB, CKMBINDEX, TROPONINI,  in the last 72 hours No components found with this basename: POCBNP,  No results found for this basename: DDIMER,  in the last 72 hours No results found for this basename: HGBA1C,  in the last 72 hours No results found for this basename: CHOL, HDL, LDLCALC, TRIG, CHOLHDL, LDLDIRECT,  in the last 72 hours No results found for this basename: TSH, T4TOTAL, FREET3, T3FREE, THYROIDAB,  in the last 72 hours No results found for this basename: VITAMINB12, FOLATE, FERRITIN, TIBC, IRON, RETICCTPCT,  in the last 72 hours Coags: No results found for this basename: PT, INR,  in the last 72 hours Microbiology: Recent Results (from the past 240 hour(s))  SURGICAL PCR SCREEN     Status: Abnormal   Collection Time    02/21/14  5:57 AM      Result Value Ref Range Status   MRSA, PCR NEGATIVE  NEGATIVE Final   Staphylococcus aureus POSITIVE (*) NEGATIVE Final   Comment:            The Xpert SA Assay (FDA     approved for NASAL specimens     in patients over 55 years of age),     is one component of     a comprehensive surveillance     program.  Test performance has  been validated by [facility] for patients greater     than or equal to 69 year old.     It is not intended     to diagnose infection nor to     guide or monitor treatment.     BRIEF HOSPITAL COURSE:    Closed right hip fracture  -secondary to a mechanical fall  -Underwent Open reduction internal fixation of the right femur on 02/21/2014. No major issues post op, has improved significantly -Weight bearing as tolerated per orthopedics, Lovenox for DVT prophylaxis for 21 days,  will need SNF on discharge.  Active Problems:  Nausea  -secondary to narcotics  -resolved with supportive care   HTN  -cautiously continue with Cozaar and Bystolic   Anemia  -suspect secondary to perioperative blood loss, IVF dilution  -please repeat CBC in 1 week  -c/w Feso4 on discharge  Fibromyalgia  -stable with supportive care   Irritable bowel syndrome with constipation  -stable, continue with bowel regimen on discharge  Hypothyroidism  -continue home dose Synthroid.   Underlying anxiety and depression  -Home dose benzodiazepine, BuSpar. No acute issues.  TODAY-DAY OF DISCHARGE:  Subjective:   Megan Glover today has no headache,no chest abdominal pain,no new weakness tingling or numbness, feels much better wants to go home today.   Objective:   Blood pressure 125/50, pulse 65, temperature 97.9 F (36.6 C), temperature source Oral, resp. rate 20, SpO2 95.00%.  Intake/Output Summary (Last 24 hours) at 02/24/14 1113 Last data filed at 02/23/14 1715  Gross per 24 hour  Intake    480 ml  Output      0 ml  Net    480 ml   There were no vitals filed for this visit.  Exam Awake Alert, Oriented *3, No new F.N deficits, Normal affect [place].AT,PERRAL Supple Neck,No JVD, No cervical lymphadenopathy appriciated.  Symmetrical Chest wall movement, Good air movement bilaterally, CTAB RRR,No Gallops,Rubs or new Murmurs, No Parasternal Heave +ve B.Sounds, Abd Soft, Non tender, No organomegaly appriciated, No rebound -guarding or rigidity. No Cyanosis, Clubbing or edema, No new Rash or bruise  DISCHARGE CONDITION: Stable  DISPOSITION: SNF  DISCHARGE INSTRUCTIONS:    Activity:  As  tolerated with Full fall precautions use walker/cane & assistance as needed  Diet recommendation: Heart Healthy diet  Discharge Instructions   Call MD for:  redness, tenderness, or signs of infection (pain, swelling, redness, odor or green/yellow discharge around incision site)    Complete by:  As directed      Diet - low sodium heart healthy    Complete by:  As directed      Increase activity slowly    Complete by:  As directed      Weight bearing as tolerated    Complete by:  As directed            Follow-up Information   Follow up with Eulas PostLANDAU,JOSHUA P, MD. Schedule an appointment as soon as possible for a visit in 2 weeks.   Specialty:  Orthopedic Surgery   Contact information:   [address] [place] [address] [phone]       Follow up with FRIED, Megan CheadleOBERT L, MD. Schedule an appointment as soon as possible for a visit in 1 week.   Specialty:  Family Medicine   Contact information:   [address] [place] [address] [phone]       Total Time spent on discharge equals  45 minutes.  SignedJeoffrey Massed 02/24/2014 11:13 AM  **Disclaimer: This note may have been dictated with voice recognition software. Similar sounding words can inadvertently be transcribed and this note may contain transcription errors which may not have been corrected upon publication of note.**

## 2014-02-24 NOTE — Progress Notes (Signed)
     Subjective:  Patient reports pain as mild.  No complaints.  Going to SNF.  Objective:   VITALS:   Filed Vitals:   02/23/14 1349 02/23/14 1654 02/23/14 1922 02/24/14 0502  BP: 135/52  136/81 125/50  Pulse: 70  74 65  Temp: 98.1 F (36.7 C)  98.2 F (36.8 C) 97.9 F (36.6 C)  TempSrc:   Oral Oral  Resp: 18 16 16 20   SpO2: 96% 96% 93% 95%    Neurologically intact Dorsiflexion/Plantar flexion intact Incision: dressing C/D/I   Lab Results  Component Value Date   WBC 11.4* 02/23/2014   HGB 9.0* 02/23/2014   HCT 25.9* 02/23/2014   MCV 90.2 02/23/2014   PLT 124* 02/23/2014     Assessment/Plan: 3 Days Post-Op   Principal Problem:   Closed right hip fracture Active Problems:   Fibromyalgia   Irritable bowel syndrome with constipation   Hypertension   Hip fracture   Advance diet Up with therapy Discharge to SNF RTC 2 weeks with me.   Megan Glover P 02/24/2014, 1:36 PM   Teryl LucyJoshua Naydelin Ziegler, MD Cell [phone]

## 2014-02-24 NOTE — Progress Notes (Signed)
CSW (Clinical Child psychotherapistocial Worker) prepared pt dc packet and placed with shadow chart. CSW arranged non-emergent ambulance transport for 3pm. Pt, pt husband, pt nurse, and facility informed. CSW signing off.  Emmaus Brandi, LCSWA [phone]

## 2014-02-24 NOTE — Progress Notes (Signed)
CSW Proofreader(Clinical Social Worker) spoke with pt and provided bed offers. [facility]. CSW notified facility.  Letrice Pollok, LCSWA [phone]

## 2014-02-24 NOTE — Progress Notes (Signed)
I met with pt and her husband at bedside. I discussed that she was not a candidate for an inpt rehab admission. She is functionally doing well and not in need of intense inpt rehab at this time. They expressed that they do not feel comfortable discharging home. I recommend SNF and discussed with RN CM and SW. Pt and spouse are in agreement. [phone]

## 2014-02-24 NOTE — Progress Notes (Signed)
CARE MANAGEMENT NOTE 02/24/2014  Patient:  Megan Glover,Megan Glover   Account Number:  [id number]  Date Initiated:  02/21/2014  Documentation initiated by:  Vance PeperBRADY,SUSAN  Subjective/Objective Assessment:   68 yr old female admitted with right hip fracture.     Action/Plan:   patient states she will go home after surgery, lives with husband.  PT/OT eval   Anticipated DC Date:  02/24/2014   Anticipated DC Plan:  SKILLED NURSING FACILITY  In-house referral  Clinical Social Worker      DC Associate Professorlanning Services  CM consult      PAC Choice  DURABLE MEDICAL EQUIPMENT  HOME HEALTH   Choice offered to / List presented to:             Status of service:  Completed, signed off Medicare Important Message given?  YES (If response is "NO", the following Medicare IM given date fields will be blank) Date Medicare IM given:  02/24/2014 Medicare IM given by:  [facility]Tyr Franca Date Additional Medicare IM given:   Additional Medicare IM given by:    Discharge Disposition:  SKILLED NURSING FACILITY  Per UR Regulation:    If discussed at Long Length of Stay Meetings, dates discussed:    Comments:

## 2014-02-24 NOTE — Progress Notes (Signed)
Thank you for consult on Megan Glover. Chart reviewed and note that patient is progressing well with therapy. Last therapy notes indicate that she requires  min assist for ADL and min guard assist for mobility. She does is doing well with mobility and does not have medical complexity to justify CIR. Anticipate that she may progress to supervision level in next 1-2 days. Will defer CIR consult for now.

## 2014-02-24 NOTE — Progress Notes (Signed)
Report called to [facility]  

## 2014-02-24 NOTE — Progress Notes (Signed)
PT Cancellation Note  Patient Details Name: Megan FloroKathleen Glover MRN: [id number] DOB: [birth year]   Cancelled Treatment:    Reason Eval/Treat Not Completed: Patient declined, no reason specified  Patient politely declines physical therapy session this afternoon. States she is awaiting her ride to SNF which is to arrive shortly. Will follow up at later time if pt still admitted.  [address] Clitherall[place]bour, [place] [phone]  [place], Macklen Wilhoite S 02/24/2014, 2:01 PM

## 2014-02-25 ENCOUNTER — Encounter: Payer: Self-pay | Admitting: *Deleted

## 2014-02-25 ENCOUNTER — Non-Acute Institutional Stay (SKILLED_NURSING_FACILITY): Payer: Medicare Other | Admitting: Internal Medicine

## 2014-02-25 DIAGNOSIS — I10 Essential (primary) hypertension: Secondary | ICD-10-CM

## 2014-02-25 DIAGNOSIS — S72009S Fracture of unspecified part of neck of unspecified femur, sequela: Secondary | ICD-10-CM

## 2014-02-25 DIAGNOSIS — E039 Hypothyroidism, unspecified: Secondary | ICD-10-CM | POA: Insufficient documentation

## 2014-02-25 DIAGNOSIS — S72001S Fracture of unspecified part of neck of right femur, sequela: Secondary | ICD-10-CM

## 2014-02-25 DIAGNOSIS — D62 Acute posthemorrhagic anemia: Secondary | ICD-10-CM

## 2014-02-25 NOTE — Progress Notes (Signed)
HISTORY & PHYSICAL  DATE: 02/25/2014   FACILITY: [facility]  LEVEL OF CARE: SNF (31)  ALLERGIES:  Allergies  Allergen Reactions  . Clindamycin/Lincomycin     CDIF  . Demerol [Meperidine] Nausea And Vomiting, Other (See Comments) and Hypertension    Severe headache  . Erythromycin Diarrhea  . Hydrochlorothiazide Other (See Comments)    Weakness/malaise  . Spironolactone Other (See Comments)    Weakness/malaise  . Sulfa Antibiotics Other (See Comments)    Anal itching    CHIEF COMPLAINT:  Manage right hip fracture, hypothyroidism and acute blood loss anemia  HISTORY OF PRESENT ILLNESS: Patient is a 68 year old Caucasian female.  HIP FRACTURE: The patient had a mechanical fall and sustained a femur fracture.  Patient subsequently underwent surgical repair and tolerated the procedure well. Patient is admitted to this facility for short-term rehabilitation. Patient denies hip pain currently. No complications reported from the pain medications currently being used.  ANEMIA: The anemia has been stable. The patient denies fatigue, melena or hematochezia. No complications from the medications currently being used. Postoperatively the patient suffered acute blood loss. She did not require transfusion.  HYPOTHYROIDISM: The hypothyroidism remains stable. No complications noted from the medications presently being used.  The patient denies fatigue or constipation.  Last TSH not available.  PAST MEDICAL HISTORY :  Past Medical History  Diagnosis Date  . Hypertension   . Thyroid disease   . History of diverticulosis   . Fibromyalgia   . Hypothyroidism   . IBS (irritable bowel syndrome)   . Clostridium difficile diarrhea   . Anxiety   . Depression   . Anemia     PAST SURGICAL HISTORY: Past Surgical History  Procedure Laterality Date  . Dilation and curettage of uterus    . Intramedullary (im) nail intertrochanteric Right 02/21/2014    Procedure:  INTRAMEDULLARY (IM) NAIL INTERTROCHANTRIC;  Surgeon: Eulas Post, MD;  Location: [facility] OR;  Service: Orthopedics;  Laterality: Right;    SOCIAL HISTORY:  reports that she has never smoked. She has never used smokeless tobacco. She reports that she does not drink alcohol or use illicit drugs.  FAMILY HISTORY:  Family History  Problem Relation Age of Onset  . Breast cancer Mother     breast CA  . Diabetes Father   . Hypertension Father   . Colon cancer      CURRENT MEDICATIONS: Reviewed per MAR/see medication list  REVIEW OF SYSTEMS:  See HPI otherwise 14 point ROS is negative.  PHYSICAL EXAMINATION  VS:  See VS section  GENERAL: no acute distress, normal body habitus EYES: conjunctivae normal, sclerae normal, normal eye lids MOUTH/THROAT: lips without lesions,no lesions in the mouth,tongue is without lesions,uvula elevates in midline NECK: supple, trachea midline, no neck masses, no thyroid tenderness, no thyromegaly LYMPHATICS: no LAN in the neck, no supraclavicular LAN RESPIRATORY: breathing is even & unlabored, BS CTAB CARDIAC: RRR, no murmur,no extra heart sounds, no edema GI:  ABDOMEN: abdomen soft, normal BS, no masses, no tenderness  LIVER/SPLEEN: no hepatomegaly, no splenomegaly MUSCULOSKELETAL: HEAD: normal to inspection  EXTREMITIES: LEFT UPPER EXTREMITY: full range of motion, normal strength & tone RIGHT UPPER EXTREMITY:  full range of motion, normal strength & tone LEFT LOWER EXTREMITY:  full range of motion, normal strength & tone RIGHT LOWER EXTREMITY:  range of motion not tested due to surgery, normal strength & tone PSYCHIATRIC: the patient is alert & oriented to person, affect &  behavior appropriate  LABS/RADIOLOGY:  Labs reviewed: Basic Metabolic Panel:  Recent Labs  16/10/96 1749 02/21/14 0630 02/21/14 2330 02/22/14 0558  NA 136* 135*  --  133*  K 4.5 4.1  --  4.5  CL 99 100  --  97  CO2 25 23  --  21  GLUCOSE 97 105*  --  108*  BUN 15 12   --  9  CREATININE 0.74 0.68 0.65 0.65  CALCIUM 9.5 8.7  --  8.3*   Liver Function Tests:  Recent Labs  09/12/13 1647  AST 29  ALT 21  ALKPHOS 66  BILITOT 0.5  PROT 7.6  ALBUMIN 4.1    Recent Labs  09/12/13 2300  LIPASE 40   CBC:  Recent Labs  09/12/13 1647 12/10/13 1900 02/20/14 1749  02/21/14 2330 02/22/14 0558 02/23/14 0638  WBC 8.8 9.7 12.5*  < > 10.1 11.1* 11.4*  NEUTROABS 6.5 6.5 10.3*  --   --   --   --   HGB 14.3 14.8 12.4  < > 10.1* 9.8* 9.0*  HCT 39.8 41.7 35.5*  < > 29.2* 28.5* 25.9*  MCV 88.8 90.3 90.6  < > 91.8 91.1 90.2  PLT 197 218 202  < > 134* 130* 124*  < > = values in this interval not displayed.  Cardiac Enzymes:  Recent Labs  09/13/13 0745 09/13/13 1340 12/10/13 1900  TROPONINI <0.30 <0.30 <0.30    RIGHT KNEE - 1-2 VIEW   COMPARISON:  None.   FINDINGS: There is no evidence of fracture, dislocation, or joint effusion. There is no evidence of arthropathy or other focal bone abnormality. Soft tissues are unremarkable.   IMPRESSION: Negative.   BILATERAL HIP WITH PELVIS - 4+ VIEW   COMPARISON:  None.   FINDINGS: Nondisplaced intertrochanteric fracture right femur. Right hip joint is normal.   Negative for left hip fracture. Benign sclerotic lesion in the intertrochanteric region. Left hip joint is normal. No pelvic fracture   IMPRESSION: Nondisplaced intertrochanteric fracture right femur.   PORTABLE CHEST - 1 VIEW   COMPARISON:  Two-view chest 09/12/2013.   FINDINGS: Heart size is normal. Minimal bibasilar scarring is stable. The lungs are otherwise clear. Atherosclerotic calcifications are noted at the aortic arch. The visualized soft tissues and bony thorax are unremarkable.   IMPRESSION: 1. No acute cardiopulmonary disease or significant interval change. 2. Atherosclerosis. DG C-ARM 61-120 MIN; RIGHT FEMUR - 2 VIEW   FLUOROSCOPY TIME:  1 min and 16 seconds.   COMPARISON:  None.   FINDINGS: Status post  placement of intramedullary nail for intertrochanteric fracture.   IMPRESSION: Satisfactory position and alignment.   DG C-ARM 61-120 MIN; RIGHT FEMUR - 2 VIEW   FLUOROSCOPY TIME:  1 min and 16 seconds.   COMPARISON:  None.   FINDINGS: Status post placement of intramedullary nail for intertrochanteric fracture.   IMPRESSION: Satisfactory position and alignment. PORTABLE PELVIS 1-2 VIEWS; PORTABLE RIGHT HIP - 1 VIEW   COMPARISON:  Fluoroscopy 02/21/2014   FINDINGS: Long intra medullary rod and compression screw fixation of a fracture of the intertrochanteric region and base of the right femoral neck. Alignment and position are unchanged since previous intraoperative fluoroscopy. Soft tissue gas collections consistent with recent surgery. Mild degenerative changes in the hips. Sclerosis in the proximal left femur likely represents enchondroma or bone island.   IMPRESSION: Internal fixation of right intertrochanteric hip fracture.   PORTABLE PELVIS 1-2 VIEWS; PORTABLE RIGHT HIP - 1 VIEW   COMPARISON:  Fluoroscopy 02/21/2014   FINDINGS: Long intra medullary rod and compression screw fixation of a fracture of the intertrochanteric region and base of the right femoral neck. Alignment and position are unchanged since previous intraoperative fluoroscopy. Soft tissue gas collections consistent with recent surgery. Mild degenerative changes in the hips. Sclerosis in the proximal left femur likely represents enchondroma or bone island.   IMPRESSION: Internal fixation of right intertrochanteric hip fracture.   ASSESSMENT/PLAN:  Right hip fracture-status post ORIF. Continue rehabilitation. Acute blood loss anemia-continue iron. Check hemoglobin. Hypothyroidism-continue Synthroid. Hypertension-well-controlled Constipation-well-controlled Check CBC and BMP  I have reviewed patient's medical records received at admission/from hospitalization.  CPT CODE: [id number]  Angela CoxGayani Y  Cillian Gwinner, MD [facility] [phone]

## 2014-02-25 NOTE — Progress Notes (Signed)
This encounter was created in error - please disregard.

## 2014-02-26 ENCOUNTER — Encounter: Payer: Self-pay | Admitting: Adult Health

## 2014-02-26 ENCOUNTER — Non-Acute Institutional Stay (SKILLED_NURSING_FACILITY): Payer: Medicare Other | Admitting: Adult Health

## 2014-02-26 ENCOUNTER — Other Ambulatory Visit: Payer: Self-pay | Admitting: *Deleted

## 2014-02-26 DIAGNOSIS — F411 Generalized anxiety disorder: Secondary | ICD-10-CM

## 2014-02-26 DIAGNOSIS — S72009D Fracture of unspecified part of neck of unspecified femur, subsequent encounter for closed fracture with routine healing: Secondary | ICD-10-CM

## 2014-02-26 DIAGNOSIS — D62 Acute posthemorrhagic anemia: Secondary | ICD-10-CM

## 2014-02-26 DIAGNOSIS — F419 Anxiety disorder, unspecified: Secondary | ICD-10-CM

## 2014-02-26 DIAGNOSIS — I10 Essential (primary) hypertension: Secondary | ICD-10-CM

## 2014-02-26 DIAGNOSIS — E039 Hypothyroidism, unspecified: Secondary | ICD-10-CM

## 2014-02-26 DIAGNOSIS — S72001D Fracture of unspecified part of neck of right femur, subsequent encounter for closed fracture with routine healing: Secondary | ICD-10-CM

## 2014-02-26 MED ORDER — CLONAZEPAM 0.5 MG PO TABS: ORAL_TABLET | ORAL | Status: DC

## 2014-02-26 NOTE — Progress Notes (Signed)
Patient ID: Esmee Fallaw, female   DOB: 1946/01/02, 68 y.o.   MRN: [id number]         PROGRESS NOTE  DATE: 02/26/2014  FACILITY:  [facility]  LEVEL OF CARE: SNF (31)  Acute Visit  CHIEF COMPLAINT:  Medication Management  HISTORY OF PRESENT ILLNESS: This is a 68 year old female who has been admitted to [facility] on 02/24/14 from [facility] with Closed right hip fracture S/P ORIF. She verbalized that her medication for @ home is different from what she is taking. Obtained a copy of her medication list from her primary care physician. Discussed medication list with patient and husband.   PAST MEDICAL HISTORY : Reviewed.  No changes/see problem list  CURRENT MEDICATIONS: Reviewed per MAR/see medication list  REVIEW OF SYSTEMS:  GENERAL: no change in appetite, no fatigue, no weight changes, no fever, chills or weakness RESPIRATORY: no cough, SOB, DOE,, wheezing, hemoptysis CARDIAC: no chest pain, edema or palpitations GI: no abdominal pain, diarrhea, constipation, heart burn, nausea or vomiting  PHYSICAL EXAMINATION  GENERAL: no acute distress, normal body habitus EYES: conjunctivae normal, sclerae normal, normal eye lids NECK: supple, trachea midline, no neck masses, no thyroid tenderness, no thyromegaly LYMPHATICS: no LAN in the neck, no supraclavicular LAN RESPIRATORY: breathing is even & unlabored, BS CTAB CARDIAC: RRR, no murmur,no extra heart sounds, no edema GI: abdomen soft, normal BS, no masses, no tenderness, no hepatomegaly, no splenomegaly EXTREMITIES: able to move all 4 extremities PSYCHIATRIC: the patient is alert & oriented to person, affect & behavior appropriate  LABS/RADIOLOGY: Labs reviewed: Basic Metabolic Panel:  Recent Labs  40/98/11 1749 02/21/14 0630 02/21/14 2330 02/22/14 0558  NA 136* 135*  --  133*  K 4.5 4.1  --  4.5  CL 99 100  --  97  CO2 25 23  --  21  GLUCOSE 97 105*  --  108*  BUN 15 12  --  9    CREATININE 0.74 0.68 0.65 0.65  CALCIUM 9.5 8.7  --  8.3*   Liver Function Tests:  Recent Labs  09/12/13 1647  AST 29  ALT 21  ALKPHOS 66  BILITOT 0.5  PROT 7.6  ALBUMIN 4.1    Recent Labs  09/12/13 2300  LIPASE 40   CBC:  Recent Labs  09/12/13 1647 12/10/13 1900 02/20/14 1749  02/21/14 2330 02/22/14 0558 02/23/14 0638  WBC 8.8 9.7 12.5*  < > 10.1 11.1* 11.4*  NEUTROABS 6.5 6.5 10.3*  --   --   --   --   HGB 14.3 14.8 12.4  < > 10.1* 9.8* 9.0*  HCT 39.8 41.7 35.5*  < > 29.2* 28.5* 25.9*  MCV 88.8 90.3 90.6  < > 91.8 91.1 90.2  PLT 197 218 202  < > 134* 130* 124*  < > = values in this interval not displayed.  Cardiac Enzymes:  Recent Labs  09/13/13 0745 09/13/13 1340 12/10/13 1900  TROPONINI <0.30 <0.30 <0.30    ASSESSMENT/PLAN:  Closed right hip fracture S/P ORIF - continue rehabilitation Anxiety - Discontinue Xanax and Buspar; start on Clonazepam 0.5 mg 1 tab PO Q 10AM and 10PM Hypothyroidism - start Synthroid 50 mcg 1 tab PO Q D Hypertension - check BP Q shift X 1 week Anemia, acute blood loss - discontinue FeSO4 per patient request since it "bothers" her stomach; check CBC and BMP in AM Discontinue Vitamin B-complex, Mag-Ox, Zinc and Biotin   Total patient care time: Greater  than 30 minutes  CPT CODE: [id number]  Ella BodoMonina Vargas - NP [facility] [phone]

## 2014-02-26 NOTE — Telephone Encounter (Signed)
[facility] 

## 2014-03-07 ENCOUNTER — Inpatient Hospital Stay (HOSPITAL_COMMUNITY): Admission: RE | Admit: 2014-03-07 | Payer: Medicare Other | Source: Ambulatory Visit

## 2014-03-14 ENCOUNTER — Ambulatory Visit: Admit: 2014-03-14 | Payer: Self-pay | Admitting: Surgery

## 2014-03-14 ENCOUNTER — Ambulatory Visit (HOSPITAL_COMMUNITY): Admission: RE | Admit: 2014-03-14 | Payer: Medicare Other | Source: Ambulatory Visit | Admitting: Surgery

## 2014-03-14 ENCOUNTER — Encounter (HOSPITAL_COMMUNITY): Admission: RE | Payer: Self-pay | Source: Ambulatory Visit

## 2014-03-14 SURGERY — REPAIR, HERNIA, INGUINAL, BILATERAL, LAPAROSCOPIC
Anesthesia: General | Site: Groin

## 2014-03-14 SURGERY — REPAIR, HERNIA, INGUINAL, BILATERAL, LAPAROSCOPIC
Anesthesia: General | Laterality: Bilateral

## 2014-04-01 ENCOUNTER — Encounter (INDEPENDENT_AMBULATORY_CARE_PROVIDER_SITE_OTHER): Payer: Medicare Other | Admitting: Surgery

## 2014-04-30 ENCOUNTER — Encounter (HOSPITAL_COMMUNITY): Payer: Self-pay | Admitting: Pharmacy Technician

## 2014-05-09 ENCOUNTER — Other Ambulatory Visit (INDEPENDENT_AMBULATORY_CARE_PROVIDER_SITE_OTHER): Payer: Self-pay

## 2014-05-09 ENCOUNTER — Encounter (HOSPITAL_COMMUNITY): Payer: Self-pay

## 2014-05-09 ENCOUNTER — Encounter (HOSPITAL_COMMUNITY)
Admission: RE | Admit: 2014-05-09 | Discharge: 2014-05-09 | Disposition: A | Discharge status: Home / Self Care | Payer: Medicare Other | Source: Ambulatory Visit | Attending: Surgery | Admitting: Surgery

## 2014-05-09 DIAGNOSIS — Z01812 Encounter for preprocedural laboratory examination: Secondary | ICD-10-CM | POA: Diagnosis present

## 2014-05-09 DIAGNOSIS — K4030 Unilateral inguinal hernia, with obstruction, without gangrene, not specified as recurrent: Secondary | ICD-10-CM | POA: Insufficient documentation

## 2014-05-09 HISTORY — DX: Unspecified cataract: H26.9

## 2014-05-09 LAB — CBC
HCT: 37.9 % (ref 36.0–46.0)
Hemoglobin: 13.1 g/dL (ref 12.0–15.0)
MCH: 30.4 pg (ref 26.0–34.0)
MCHC: 34.6 g/dL (ref 30.0–36.0)
MCV: 87.9 fL (ref 78.0–100.0)
Platelets: 253 10*3/uL (ref 150–400)
RBC: 4.31 MIL/uL (ref 3.87–5.11)
RDW: 12 % (ref 11.5–15.5)
WBC: 8.1 10*3/uL (ref 4.0–10.5)

## 2014-05-09 LAB — BASIC METABOLIC PANEL
Anion gap: 13 (ref 5–15)
BUN: 13 mg/dL (ref 6–23)
CALCIUM: 9.6 mg/dL (ref 8.4–10.5)
CO2: 26 meq/L (ref 19–32)
CREATININE: 0.67 mg/dL (ref 0.50–1.10)
Chloride: 94 mEq/L — ABNORMAL LOW (ref 96–112)
GFR calc Af Amer: >90 mL/min (ref >90)
GFR, EST NON AFRICAN AMERICAN: 88 mL/min — AB (ref >90)
Glucose, Bld: 94 mg/dL (ref 70–99)
Potassium: 5 mEq/L (ref 3.7–5.3)
SODIUM: 133 meq/L — AB (ref 137–147)

## 2014-05-09 NOTE — Progress Notes (Signed)
05-09-14 1600 Need MD order entry in Epic. ? Previous orders dropped out due to 7'15 hernia surgery cancelled. Pt. had -CBC, BMP per anesthesia today. EKG/ CXR done 7'15 Epic. thanks

## 2014-05-09 NOTE — Patient Instructions (Signed)
[address] Veley  05/09/2014   Your procedure is scheduled on:   05-16-2014 Friday  Enter through [facility] Entrance and follow signs to [facility]. Arrive at    [facility].  Call this number if you have problems the morning of surgery: [phone]  Or Presurgical Testing [phone].   For Living Will and/or Health Care Power Attorney Forms: please provide copy for your medical record,may bring AM of surgery(Forms should be already notarized -we do not provide this service).(05-09-14 Yes/ prefer not to provide at this time.-spouse is aware).      Do not eat food/ or drink: After Midnight.  Exception: may have clear liquids:up to 6 Hours before arrival. Nothing after: 0800 AM  Clear liquids include soda, tea, black coffee, apple or grape juice, broth.  Take these medicines the morning of surgery with A SIP OF WATER: Clonazepam. Levothyroxine. Bystolic. Use/ Bring Inhaler.   Do not wear jewelry, make-up or nail polish.  Do not wear lotions, powders, or perfumes. You may not  wear deodorant.  Do not shave 48 hours(2 days) prior to first CHG shower(legs and under arms).(Shaving face and neck okay.)  Do not bring valuables to the hospital.(Hospital is not responsible for lost valuables).  Contacts, dentures or removable bridgework, body piercing, hair pins may not be worn into surgery.  Leave suitcase in the car. After surgery it may be brought to your room.  For patients admitted to the hospital, checkout time is 11:00 AM the day of discharge.(Restricted visitors-Any Persons displaying flu-like symptoms or illness).    Patients discharged the day of surgery will not be allowed to drive home. Must have responsible person with you x 24 hours once discharged.  Name and phone number of your driver: Forrest Moron [phone] cell  Special Instructions: CHG(Chlorhedine 4%-"Hibiclens","Betasept","Aplicare") Shower Use Special Wash: see special instructions.(avoid face and  genitals)   ____________________    [facility] - Preparing for Surgery Before surgery, you can play an important role.  Because skin is not sterile, your skin needs to be as free of germs as possible.  You can reduce the number of germs on your skin by washing with CHG (chlorahexidine gluconate) soap before surgery.  CHG is an antiseptic cleaner which kills germs and bonds with the skin to continue killing germs even after washing. Please DO NOT use if you have an allergy to CHG or antibacterial soaps.  If your skin becomes reddened/irritated stop using the CHG and inform your nurse when you arrive at Short Stay. Do not shave (including legs and underarms) for at least 48 hours prior to the first CHG shower.  You may shave your face/neck. Please follow these instructions carefully:  1.  Shower with CHG Soap the night before surgery and the  morning of Surgery.  2.  If you choose to wash your hair, wash your hair first as usual with your  normal  shampoo.  3.  After you shampoo, rinse your hair and body thoroughly to remove the  shampoo.                           4.  Use CHG as you would any other liquid soap.  You can apply chg directly  to the skin and wash                       Gently with a scrungie or clean washcloth.  5.  Apply the CHG Soap to your body ONLY FROM THE NECK DOWN.   Do not use on face/ open                           Wound or open sores. Avoid contact with eyes, ears mouth and genitals (private parts).                       Wash face,  Genitals (private parts) with your normal soap.             6.  Wash thoroughly, paying special attention to the area where your surgery  will be performed.  7.  Thoroughly rinse your body with warm water from the neck down.  8.  DO NOT shower/wash with your normal soap after using and rinsing off  the CHG Soap.                9.  Pat yourself dry with a clean towel.            10.  Wear clean pajamas.            11.  Place clean sheets on your  bed the night of your first shower and do not  sleep with pets. Day of Surgery : Do not apply any lotions/deodorants the morning of surgery.  Please wear clean clothes to the hospital/surgery center.  FAILURE TO FOLLOW THESE INSTRUCTIONS MAY RESULT IN THE CANCELLATION OF YOUR SURGERY PATIENT SIGNATURE_________________________________  NURSE SIGNATURE__________________________________  ________________________________________________________________________

## 2014-05-09 NOTE — Pre-Procedure Instructions (Signed)
05-09-14 EKG/ CXR 7'15 Epic.

## 2014-05-10 ENCOUNTER — Other Ambulatory Visit (INDEPENDENT_AMBULATORY_CARE_PROVIDER_SITE_OTHER): Payer: Self-pay | Admitting: Surgery

## 2014-05-16 ENCOUNTER — Encounter (HOSPITAL_COMMUNITY): Payer: Self-pay | Admitting: *Deleted

## 2014-05-16 ENCOUNTER — Encounter (HOSPITAL_COMMUNITY)
Admission: RE | Disposition: A | Discharge status: Home / Self Care | Payer: Self-pay | Source: Ambulatory Visit | Attending: Surgery

## 2014-05-16 ENCOUNTER — Encounter (HOSPITAL_COMMUNITY): Payer: Medicare Other | Admitting: Anesthesiology

## 2014-05-16 ENCOUNTER — Ambulatory Visit (HOSPITAL_COMMUNITY): Payer: Medicare Other | Admitting: Anesthesiology

## 2014-05-16 ENCOUNTER — Ambulatory Visit (HOSPITAL_COMMUNITY)
Admission: RE | Admit: 2014-05-16 | Discharge: 2014-05-16 | Disposition: A | Discharge status: Home / Self Care | Payer: Medicare Other | Source: Ambulatory Visit | Attending: Surgery | Admitting: Surgery

## 2014-05-16 DIAGNOSIS — K589 Irritable bowel syndrome without diarrhea: Secondary | ICD-10-CM | POA: Insufficient documentation

## 2014-05-16 DIAGNOSIS — F419 Anxiety disorder, unspecified: Secondary | ICD-10-CM | POA: Insufficient documentation

## 2014-05-16 DIAGNOSIS — Z882 Allergy status to sulfonamides status: Secondary | ICD-10-CM | POA: Insufficient documentation

## 2014-05-16 DIAGNOSIS — Z885 Allergy status to narcotic agent status: Secondary | ICD-10-CM | POA: Insufficient documentation

## 2014-05-16 DIAGNOSIS — F329 Major depressive disorder, single episode, unspecified: Secondary | ICD-10-CM | POA: Insufficient documentation

## 2014-05-16 DIAGNOSIS — K4020 Bilateral inguinal hernia, without obstruction or gangrene, not specified as recurrent: Secondary | ICD-10-CM | POA: Insufficient documentation

## 2014-05-16 DIAGNOSIS — Z881 Allergy status to other antibiotic agents status: Secondary | ICD-10-CM | POA: Insufficient documentation

## 2014-05-16 DIAGNOSIS — I10 Essential (primary) hypertension: Secondary | ICD-10-CM | POA: Diagnosis not present

## 2014-05-16 DIAGNOSIS — K4090 Unilateral inguinal hernia, without obstruction or gangrene, not specified as recurrent: Secondary | ICD-10-CM

## 2014-05-16 DIAGNOSIS — E039 Hypothyroidism, unspecified: Secondary | ICD-10-CM | POA: Insufficient documentation

## 2014-05-16 DIAGNOSIS — F411 Generalized anxiety disorder: Secondary | ICD-10-CM | POA: Diagnosis present

## 2014-05-16 DIAGNOSIS — K4030 Unilateral inguinal hernia, with obstruction, without gangrene, not specified as recurrent: Secondary | ICD-10-CM | POA: Diagnosis present

## 2014-05-16 DIAGNOSIS — M797 Fibromyalgia: Secondary | ICD-10-CM | POA: Diagnosis not present

## 2014-05-16 DIAGNOSIS — Z87891 Personal history of nicotine dependence: Secondary | ICD-10-CM | POA: Insufficient documentation

## 2014-05-16 DIAGNOSIS — K581 Irritable bowel syndrome with constipation: Secondary | ICD-10-CM | POA: Diagnosis present

## 2014-05-16 HISTORY — PX: INGUINAL HERNIA REPAIR: SHX194

## 2014-05-16 SURGERY — REPAIR, HERNIA, INGUINAL, BILATERAL, LAPAROSCOPIC
Anesthesia: General | Laterality: Bilateral

## 2014-05-16 MED ORDER — DEXAMETHASONE SODIUM PHOSPHATE 10 MG/ML IJ SOLN
INTRAMUSCULAR | Status: AC
  Filled 2014-05-16: qty 1

## 2014-05-16 MED ORDER — CEFAZOLIN SODIUM-DEXTROSE 2-3 GM-% IV SOLR
2.0000 g | INTRAVENOUS | Status: AC
Start: 2014-05-16 — End: 2014-05-16
  Administered 2014-05-16: 2 g via INTRAVENOUS

## 2014-05-16 MED ORDER — FENTANYL CITRATE 0.05 MG/ML IJ SOLN
INTRAMUSCULAR | Status: AC
  Filled 2014-05-16: qty 5

## 2014-05-16 MED ORDER — LIDOCAINE HCL (CARDIAC) 20 MG/ML IV SOLN
INTRAVENOUS | Status: AC
  Filled 2014-05-16: qty 5

## 2014-05-16 MED ORDER — ONDANSETRON HCL 4 MG/2ML IJ SOLN
INTRAMUSCULAR | Status: AC
  Filled 2014-05-16: qty 2

## 2014-05-16 MED ORDER — LACTATED RINGERS IV SOLN
INTRAVENOUS | Status: DC
  Administered 2014-05-16: 1000 mL via INTRAVENOUS
  Administered 2014-05-16: 15:00:00 via INTRAVENOUS
  Administered 2014-05-16: 1000 mL via INTRAVENOUS

## 2014-05-16 MED ORDER — BUPIVACAINE-EPINEPHRINE (PF) 0.25% -1:200000 IJ SOLN
INTRAMUSCULAR | Status: AC
  Filled 2014-05-16: qty 30

## 2014-05-16 MED ORDER — EPHEDRINE SULFATE 50 MG/ML IJ SOLN
INTRAMUSCULAR | Status: DC | PRN
  Administered 2014-05-16 (×2): 5 mg via INTRAVENOUS

## 2014-05-16 MED ORDER — HYDROMORPHONE HCL 1 MG/ML IJ SOLN
INTRAMUSCULAR | Status: AC
  Filled 2014-05-16: qty 1

## 2014-05-16 MED ORDER — MIDAZOLAM HCL 2 MG/2ML IJ SOLN
INTRAMUSCULAR | Status: AC
  Filled 2014-05-16: qty 2

## 2014-05-16 MED ORDER — FENTANYL CITRATE 0.05 MG/ML IJ SOLN
INTRAMUSCULAR | Status: DC | PRN
  Administered 2014-05-16: 100 ug via INTRAVENOUS
  Administered 2014-05-16 (×3): 50 ug via INTRAVENOUS

## 2014-05-16 MED ORDER — DEXAMETHASONE SODIUM PHOSPHATE 10 MG/ML IJ SOLN
INTRAMUSCULAR | Status: DC | PRN
  Administered 2014-05-16: 10 mg via INTRAVENOUS

## 2014-05-16 MED ORDER — MIDAZOLAM HCL 5 MG/5ML IJ SOLN
INTRAMUSCULAR | Status: DC | PRN
  Administered 2014-05-16: 2 mg via INTRAVENOUS

## 2014-05-16 MED ORDER — BUPIVACAINE-EPINEPHRINE 0.25% -1:200000 IJ SOLN
INTRAMUSCULAR | Status: DC | PRN
  Administered 2014-05-16: 70 mL

## 2014-05-16 MED ORDER — CHLORHEXIDINE GLUCONATE 4 % EX LIQD: 1.0000 "application " | Freq: Once | CUTANEOUS | Status: DC

## 2014-05-16 MED ORDER — 0.9 % SODIUM CHLORIDE (POUR BTL) OPTIME
TOPICAL | Status: DC | PRN
  Administered 2014-05-16: 1000 mL

## 2014-05-16 MED ORDER — OXYCODONE HCL 5 MG PO TABS: 5.0000 mg | ORAL_TABLET | ORAL | Status: DC | PRN

## 2014-05-16 MED ORDER — HYDROMORPHONE HCL 1 MG/ML IJ SOLN
0.2500 mg | INTRAMUSCULAR | Status: DC | PRN
  Administered 2014-05-16 (×2): 0.25 mg via INTRAVENOUS
  Administered 2014-05-16 (×2): 0.5 mg via INTRAVENOUS

## 2014-05-16 MED ORDER — KETOROLAC TROMETHAMINE 30 MG/ML IJ SOLN
INTRAMUSCULAR | Status: DC | PRN
  Administered 2014-05-16: 30 mg via INTRAVENOUS

## 2014-05-16 MED ORDER — ONDANSETRON HCL 4 MG/2ML IJ SOLN
INTRAMUSCULAR | Status: DC | PRN
  Administered 2014-05-16: 4 mg via INTRAVENOUS

## 2014-05-16 MED ORDER — LIDOCAINE HCL (CARDIAC) 20 MG/ML IV SOLN
INTRAVENOUS | Status: DC | PRN
  Administered 2014-05-16: 50 mg via INTRAVENOUS

## 2014-05-16 MED ORDER — KETOROLAC TROMETHAMINE 30 MG/ML IJ SOLN
INTRAMUSCULAR | Status: AC
  Filled 2014-05-16: qty 1

## 2014-05-16 MED ORDER — GLYCOPYRROLATE 0.2 MG/ML IJ SOLN
INTRAMUSCULAR | Status: DC | PRN
Start: 2014-05-16 — End: 2014-05-16
  Administered 2014-05-16: 0.6 mg via INTRAVENOUS

## 2014-05-16 MED ORDER — BUPIVACAINE-EPINEPHRINE 0.25% -1:200000 IJ SOLN
INTRAMUSCULAR | Status: AC
  Filled 2014-05-16: qty 1

## 2014-05-16 MED ORDER — SUCCINYLCHOLINE CHLORIDE 20 MG/ML IJ SOLN
INTRAMUSCULAR | Status: DC | PRN
  Administered 2014-05-16: 100 mg via INTRAVENOUS

## 2014-05-16 MED ORDER — CEFAZOLIN SODIUM-DEXTROSE 2-3 GM-% IV SOLR
INTRAVENOUS | Status: AC
  Filled 2014-05-16: qty 50

## 2014-05-16 MED ORDER — ROCURONIUM BROMIDE 100 MG/10ML IV SOLN
INTRAVENOUS | Status: DC | PRN
  Administered 2014-05-16 (×2): 5 mg via INTRAVENOUS
  Administered 2014-05-16: 25 mg via INTRAVENOUS

## 2014-05-16 MED ORDER — NEOSTIGMINE METHYLSULFATE 10 MG/10ML IV SOLN
INTRAVENOUS | Status: DC | PRN
  Administered 2014-05-16: 4 mg via INTRAVENOUS

## 2014-05-16 MED ORDER — PROMETHAZINE HCL 25 MG/ML IJ SOLN: 6.2500 mg | INTRAMUSCULAR | Status: DC | PRN

## 2014-05-16 MED ORDER — OXYCODONE HCL 5 MG PO TABS
5.0000 mg | ORAL_TABLET | Freq: Once | ORAL | Status: AC
  Administered 2014-05-16: 5 mg via ORAL
  Filled 2014-05-16: qty 1

## 2014-05-16 MED ORDER — PROPOFOL 10 MG/ML IV BOLUS
INTRAVENOUS | Status: DC | PRN
  Administered 2014-05-16: 180 mg via INTRAVENOUS

## 2014-05-16 MED ORDER — PROPOFOL 10 MG/ML IV BOLUS
INTRAVENOUS | Status: AC
Start: 2014-05-16 — End: 2014-05-16
  Filled 2014-05-16: qty 20

## 2014-05-16 SURGICAL SUPPLY — 32 items
CABLE HIGH FREQUENCY MONO STRZ (ELECTRODE) ×1 IMPLANT
CANISTER SUCTION 2500CC (MISCELLANEOUS) ×1 IMPLANT
CHLORAPREP W/TINT 26ML (MISCELLANEOUS) ×2 IMPLANT
DECANTER SPIKE VIAL GLASS SM (MISCELLANEOUS) ×2 IMPLANT
DEVICE SECURE STRAP 25 ABSORB (INSTRUMENTS) IMPLANT
DRAPE LAPAROSCOPIC ABDOMINAL (DRAPES) ×2 IMPLANT
DRAPE UTILITY XL STRL (DRAPES) ×2 IMPLANT
DRAPE WARM FLUID 44X44 (DRAPE) ×2 IMPLANT
DRSG TEGADERM 2-3/8X2-3/4 SM (GAUZE/BANDAGES/DRESSINGS) ×4 IMPLANT
DRSG TEGADERM 4X4.75 (GAUZE/BANDAGES/DRESSINGS) ×2 IMPLANT
ELECT REM PT RETURN 9FT ADLT (ELECTROSURGICAL) ×2
ELECTRODE REM PT RTRN 9FT ADLT (ELECTROSURGICAL) ×1 IMPLANT
GAUZE SPONGE 2X2 8PLY STRL LF (GAUZE/BANDAGES/DRESSINGS) IMPLANT
GLOVE ECLIPSE 8.0 STRL XLNG CF (GLOVE) ×2 IMPLANT
GLOVE INDICATOR 8.0 STRL GRN (GLOVE) ×2 IMPLANT
GOWN STRL REUS W/TWL XL LVL3 (GOWN DISPOSABLE) ×6 IMPLANT
KIT BASIN OR (CUSTOM PROCEDURE TRAY) ×2 IMPLANT
MESH ULTRAPRO 6X6 15CM15CM (Mesh General) ×2 IMPLANT
SCISSORS LAP 5X35 DISP (ENDOMECHANICALS) ×2 IMPLANT
SET IRRIG TUBING LAPAROSCOPIC (IRRIGATION / IRRIGATOR) IMPLANT
SLEEVE XCEL OPT CAN 5 100 (ENDOMECHANICALS) ×2 IMPLANT
SPONGE GAUZE 2X2 STER 10/PKG (GAUZE/BANDAGES/DRESSINGS) ×1
SUT MNCRL AB 4-0 PS2 18 (SUTURE) ×2 IMPLANT
SUT VIC AB 3-0 SH 27 (SUTURE) ×2
SUT VIC AB 3-0 SH 27X BRD (SUTURE) IMPLANT
TACKER 5MM HERNIA 3.5CML NAB (ENDOMECHANICALS) IMPLANT
TOWEL OR 17X26 10 PK STRL BLUE (TOWEL DISPOSABLE) ×2 IMPLANT
TOWEL OR NON WOVEN STRL DISP B (DISPOSABLE) ×2 IMPLANT
TRAY LAP CHOLE (CUSTOM PROCEDURE TRAY) ×2 IMPLANT
TROCAR BLADELESS OPT 5 100 (ENDOMECHANICALS) ×2 IMPLANT
TROCAR XCEL BLUNT TIP 100MML (ENDOMECHANICALS) ×2 IMPLANT
TUBING INSUFFLATION 10FT LAP (TUBING) ×2 IMPLANT

## 2014-05-16 NOTE — Anesthesia Postprocedure Evaluation (Signed)
   Anesthesia Post-op Note  Patient: Megan Glover  Procedure(s) Performed: Procedure(s) (LRB): LAPAROSCOPIC EXPLORATION AND REPAIR OF BILATERAL FEMEROL AND BILATERAL INGUINAL HERNIAS  WITH MESH (Bilateral)  Patient Location: PACU  Anesthesia Type: General  Level of Consciousness: awake and alert   Airway and Oxygen Therapy: Patient Spontanous Breathing  Post-op Pain: mild  Post-op Assessment: Post-op Vital signs reviewed, Patient's Cardiovascular Status Stable, Respiratory Function Stable, Patent Airway and No signs of Nausea or vomiting  Last Vitals:  Filed Vitals:   05/16/14 1205  BP: 157/49  Pulse:   Temp:   Resp:     Post-op Vital Signs: stable   Complications: No apparent anesthesia complications

## 2014-05-16 NOTE — Anesthesia Postprocedure Evaluation (Signed)
  Anesthesia Post-op Note  Patient: Daisy FloroKathleen Journey  Procedure(s) Performed: Procedure(s) (LRB): LAPAROSCOPIC EXPLORATION AND REPAIR OF BILATERAL FEMEROL AND BILATERAL INGUINAL HERNIAS  WITH MESH (Bilateral)  Patient Location: PACU  Anesthesia Type: General  Level of Consciousness: awake and alert   Airway and Oxygen Therapy: Patient Spontanous Breathing  Post-op Pain: mild  Post-op Assessment: Post-op Vital signs reviewed, Patient's Cardiovascular Status Stable, Respiratory Function Stable, Patent Airway and No signs of Nausea or vomiting  Last Vitals:  Filed Vitals:   05/16/14 1620  BP: 119/51  Pulse: 65  Temp: 36.3 C  Resp:     Post-op Vital Signs: stable   Complications: No apparent anesthesia complications

## 2014-05-16 NOTE — H&P (Signed)
[facility]  [address]  [place], [place] [address] Phone: [phone] FAX: [phone]     Megan Glover  [birth year] [id number]  CARE TEAM:  PCP: Lenora Boys, MD  Outpatient Care Team: Patient Care Team: Lenora Boys, MD as PCP - General (Family Medicine) Betty G Swaziland, MD as Referring Physician (Family Medicine)  Inpatient Treatment Team: Treatment Team: Attending Provider: Karie Soda, MD  This patient is a 68 y.o.female who presents today for surgical evaluation at the request of Dr. Swaziland.   Reason for visit: Right inguinal hernia, probable LIH also  Pleasant female with numerous health issues including fibromyalgia and IBS with constipation. She has had some right groin pain over the past 2 years. Became more intense. She was concern. Underwent CAT scan evaluation concerning for right inguinal hernia. A surgical consultation requested.   She struggled with chronic constipation felt to be IBS related. Has not had much help with fiber bowel regimen. Uses prune juice to have multiple bowel movements in the AM. She gets up and walks rather regularly but has to stop after 15 minutes secondary to the groin pain. She has never had any abdominal hernia surgery. Has a labile blood pressure. History of diverticulosis. Claims to have had at least 10 attacks of diverticulitis in the past, none in the past few years. Followed by [facility] gastroenterology. Benign polyp removed in 2012 on last colonoscopy according to patient. Non new events.  I saw in the summer.  She wishes surgery now   Past Medical History  Diagnosis Date  . Hypertension   . Thyroid disease   . History of diverticulosis   . Fibromyalgia   . Hypothyroidism   . IBS (irritable bowel syndrome)   . Clostridium difficile diarrhea     05-09-14 denies any problems at this time  . Anxiety   . Depression   . Anemia   . Cataract     bilateral -left > right     Past Surgical History  Procedure Laterality Date  . Dilation and curettage of uterus    . Intramedullary (im) nail intertrochanteric Right 02/21/2014    Procedure: INTRAMEDULLARY (IM) NAIL INTERTROCHANTRIC;  Surgeon: Eulas Post, MD;  Location: [facility] OR;  Service: Orthopedics;  Laterality: Right;    History   Social History  . Marital Status: Married    Spouse Name: N/A    Number of Children: N/A  . Years of Education: N/A   Occupational History  . Not on file.   Social History Main Topics  . Smoking status: Former Smoker    Types: Cigarettes    Quit date: 05/09/1980  . Smokeless tobacco: Never Used  . Alcohol Use: No  . Drug Use: No  . Sexual Activity: Not Currently   Other Topics Concern  . Not on file   Social History Narrative  . No narrative on file    Family History  Problem Relation Age of Onset  . Breast cancer Mother     breast CA  . Diabetes Father   . Hypertension Father   . Colon cancer      Current Facility-Administered Medications  Medication Dose Route Frequency Provider Last Rate Last Dose  . ceFAZolin (ANCEF) IVPB 2 g/50 mL premix  2 g Intravenous On Call to OR Karie Soda, MD      . chlorhexidine (HIBICLENS) 4 % liquid 1 application  1 application Topical Once Karie Soda, MD      . [  START ON 05/17/2014] chlorhexidine (HIBICLENS) 4 % liquid 1 application  1 application Topical Once Karie SodaSteven Krystal Teachey, MD      . lactated ringers infusion   Intravenous Continuous Felipe DroneMary Jennette Judd, MD 125 mL/hr at 05/16/14 1318 1,000 mL at 05/16/14 1318     Allergies  Allergen Reactions  . Clindamycin/Lincomycin     CDIF  . Demerol [Meperidine] Nausea And Vomiting, Other (See Comments) and Hypertension    Severe headache  . Erythromycin Diarrhea  . Hydrochlorothiazide Other (See Comments)    Weakness/malaise  . Levaquin [Levofloxacin]     Yeast infection  . Spironolactone Other (See Comments)    Weakness/malaise  . Sulfa Antibiotics Other (See  Comments)    Anal itching    ROS: Constitutional:  No fevers, chills, sweats.  Weight stable Eyes:  No vision changes, No discharge HENT:  No sore throats, nasal drainage Lymph: No neck swelling, No bruising easily Pulmonary:  No cough, productive sputum CV: No orthopnea, PND  No exertional chest/neck/shoulder/arm pain. GI:  No personal nor family history of GI/colon cancer, inflammatory bowel disease, irritable bowel syndrome, allergy such as Celiac Sprue, dietary/dairy problems, colitis, ulcers nor gastritis.  No recent sick contacts/gastroenteritis.  No travel outside the country.  No changes in diet. Renal: No UTIs, No hematuria Genital:  No drainage, bleeding, masses Musculoskeletal: No severe joint pain.  Good ROM major joints Skin:  No sores or lesions.  No rashes Heme/Lymph:  No easy bleeding.  No swollen lymph nodes Neuro: No focal weakness/numbness.  No seizures Psych: No suicidal ideation.  No hallucinations  BP 157/49  Pulse 73  Temp(Src) 97.5 F (36.4 C) (Oral)  Resp 20  Ht 5\' 7"  (1.702 m)  Wt 163 lb (73.936 kg)  BMI 25.52 kg/m2  SpO2 100%  Physical Exam: General: Pt awake/alert/oriented x4 in no major acute distress Eyes: PERRL, normal EOM. Sclera nonicteric Neuro: CN II-XII intact w/o focal sensory/motor deficits. Lymph: No head/neck/groin lymphadenopathy Psych:  No delerium/psychosis/paranoia HENT: Normocephalic, Mucus membranes moist.  No thrush Neck: Supple, No tracheal deviation Chest: No pain.  Good respiratory excursion. CV:  Pulses intact.  Regular rhythm Abdomen: Soft, Nondistended.  Nontender.  RIH unchanged. prob LIH. Ext:  SCDs BLE.  No significant edema.  No cyanosis Skin: No petechiae / purpurea.  No major sores Musculoskeletal: No severe joint pain.  Good ROM major joints   Results:   Labs: No results found for this or any previous visit (from the past 48 hour(s)).  Imaging / Studies: No results found.  Medications / Allergies: per  chart  Antibiotics: Anti-infectives   Start     Dose/Rate Route Frequency Ordered Stop   05/16/14 1134  ceFAZolin (ANCEF) IVPB 2 g/50 mL premix     2 g 100 mL/hr over 30 Minutes Intravenous On call to O.R. 05/16/14 1134 05/17/14 0559      Assessment  Megan Glover  68 y.o. female  Day of Surgery  Procedure(s): LAPAROSCOPIC EXPLORATION AND REPAIR OF HERNIAS IN RIGHT AND POSSIBLE LEFT GROINS  Problem List:  Principal Problem:   Incarcerated right inguinal hernia Active Problems:   Fibromyalgia   Irritable bowel syndrome with constipation   Inguinal hernia, left   Anxiety   RIH, incacerated.  LIH also  Plan:  Laparoscopic exploration & repair of hernias:  The anatomy & physiology of the abdominal wall and pelvic floor was discussed.  The pathophysiology of hernias in the inguinal and pelvic region was discussed.  Natural history risks such as  progressive enlargement, pain, incarceration, and strangulation was discussed.   Contributors to complications such as smoking, obesity, diabetes, prior surgery, etc were discussed.    I feel the risks of no intervention will lead to serious problems that outweigh the operative risks; therefore, I recommended surgery to reduce and repair the hernia.  I explained laparoscopic techniques with possible need for an open approach.  I noted usual use of mesh to patch and/or buttress hernia repair  Risks such as bleeding, infection, abscess, need for further treatment, heart attack, death, and other risks were discussed.  I noted a good likelihood this will help address the problem.   Goals of post-operative recovery were discussed as well.  Possibility that this will not correct all symptoms was explained.  I stressed the importance of low-impact activity, aggressive pain control, avoiding constipation, & not pushing through pain to minimize risk of post-operative chronic pain or injury. Possibility of reherniation was discussed.  We will work to  minimize complications.     An educational handout further explaining the pathology & treatment options was given as well.  Questions were answered.  The patient expresses understanding & wishes to proceed with surgery.   -VTE prophylaxis- SCDs, etc -mobilize as tolerated to help recovery    Ardeth Sportsman, M.D., F.A.C.S. Gastrointestinal and Minimally Invasive Surgery [facility] [address] [place], [place] [address] [phone] Main / Paging   05/16/2014  Note: Portions of this report may have been transcribed using voice recognition software. Every effort was made to ensure accuracy; however, inadvertent computerized transcription errors may be present.   Any transcriptional errors that result from this process are unintentional.

## 2014-05-16 NOTE — Progress Notes (Signed)
PACU PHASE 2 : Pt. Unable to void after repeated tries, bladder scan 166 cc's , intake aprox. 2800 cc's ( iv and po);Pt. States she always has trouble using public restrooms. Dr. Gerrit FriendsGerkin notified . New orders received.  I&O catheter 160 cc's obtained, pt. Discharged to home.

## 2014-05-16 NOTE — Anesthesia Preprocedure Evaluation (Addendum)
Anesthesia Evaluation  Patient identified by MRN, date of birth, ID band Patient awake    Reviewed: Allergy & Precautions, H&P , NPO status , Patient's Chart, lab work & pertinent test results  History of Anesthesia Complications Negative for: history of anesthetic complications  Airway Mallampati: II TM Distance: >3 FB Neck ROM: Full    Dental no notable dental hx. (+) Dental Advisory Given, Partial Lower   Pulmonary COPD COPD inhaler, former smoker,  breath sounds clear to auscultation  Pulmonary exam normal       Cardiovascular hypertension, Pt. on medications and Pt. on home beta blockers Rhythm:Regular Rate:Normal     Neuro/Psych Anxiety Depression negative neurological ROS     GI/Hepatic negative GI ROS, Neg liver ROS,   Endo/Other  Hypothyroidism   Renal/GU negative Renal ROS  negative genitourinary   Musculoskeletal  (+) Arthritis -, Fibromyalgia -  Abdominal   Peds negative pediatric ROS (+)  Hematology negative hematology ROS (+)   Anesthesia Other Findings   Reproductive/Obstetrics negative OB ROS                         Anesthesia Physical Anesthesia Plan  ASA: III  Anesthesia Plan: General   Post-op Pain Management:    Induction: Intravenous  Airway Management Planned: Oral ETT  Additional Equipment:   Intra-op Plan:   Post-operative Plan: Extubation in OR  Informed Consent: I have reviewed the patients History and Physical, chart, labs and discussed the procedure including the risks, benefits and alternatives for the proposed anesthesia with the patient or authorized representative who has indicated his/her understanding and acceptance.   Dental advisory given  Plan Discussed with: CRNA and Surgeon  Anesthesia Plan Comments:        Anesthesia Quick Evaluation

## 2014-05-16 NOTE — Op Note (Signed)
05/16/2014  4:17 PM  PATIENT:  Megan Glover  68 y.o. female  Patient Care Team: Lenora Boys, MD as PCP - General (Family Medicine) Betty G Swaziland, MD as Referring Physician [facility])  PRE-OPERATIVE DIAGNOSIS:  incarcerated right and possible left inguinal hernias  POST-OPERATIVE DIAGNOSIS:  bilateral femoral and bilateral inguinal hernias  PROCEDURE:  Procedure(s): LAPAROSCOPIC EXPLORATION AND REPAIR OF BILATERAL FEMORAL AND BILATERAL INGUINAL HERNIAS  WITH MESH  SURGEON:  Surgeon(s): Karie Soda, MD  ASSISTANT: RN   ANESTHESIA:   local and general  EBL:  Total I/O In: 1000 [I.V.:1000] Out: -   Delay start of Pharmacological VTE agent (>24hrs) due to surgical blood loss or risk of bleeding:  no  DRAINS: none   SPECIMEN:  No Specimen  DISPOSITION OF SPECIMEN:  N/A  COUNTS:  YES  PLAN OF CARE: Discharge to home after PACU  PATIENT DISPOSITION:  PACU - hemodynamically stable.  INDICATION: Pleasant woman with RIH & probable LIH.  The anatomy & physiology of the abdominal wall and pelvic floor was discussed.  The pathophysiology of hernias in the inguinal and pelvic region was discussed.  Natural history risks such as progressive enlargement, pain, incarceration & strangulation was discussed.   Contributors to complications such as smoking, obesity, diabetes, prior surgery, etc were discussed.    I feel the risks of no intervention will lead to serious problems that outweigh the operative risks; therefore, I recommended surgery to reduce and repair the hernia.  I explained laparoscopic techniques with possible need for an open approach.  I noted usual use of mesh to patch and/or buttress hernia repair  Risks such as bleeding, infection, abscess, need for further treatment, heart attack, death, and other risks were discussed.  I noted a good likelihood this will help address the problem.   Goals of post-operative recovery were discussed as well.  Possibility  that this will not correct all symptoms was explained.  I stressed the importance of low-impact activity, aggressive pain control, avoiding constipation, & not pushing through pain to minimize risk of post-operative chronic pain or injury. Possibility of reherniation was discussed.  We will work to minimize complications.     An educational handout further explaining the pathology & treatment options was given as well.  Questions were answered.  The patient expresses understanding & wishes to proceed with surgery.  OR FINDINGS: R>L BIH, small bilateral femoral hernias  DESCRIPTION:   The patient was identified & brought into the operating room. The patient was positioned supine with arms tucked. SCDs were active during the entire case. The patient underwent general anesthesia without any difficulty.  The abdomen was prepped and draped in a sterile fashion. The patient's bladder was emptied.  A Surgical Timeout confirmed our plan.  I made a transverse incision through the inferior umbilical fold.  I made a small transverse nick through the anterior rectus fascia contralateral to the inguinal hernia side and placed a 0-vicryl stitch through the fascia.  I placed a Hasson trocar into the preperitoneal plane.  Entry was clean.  We induced carbon dioxide insufflation. Camera inspection revealed no injury.  I used a 10mm angled scope to bluntly free the peritoneum off the infraumbilical anterior abdominal wall.  I created enough of a preperitoneal pocket to place 5mm ports into the right & left mid-abdomen into this preperitoneal cavity.  I focused attention on the right side since that was the dominant hernia side.   I used blunt & focused sharp dissection to  free the peritoneum off the flank and down to the pubic rim.  I freed the anteriolateral bladder wall off the anteriolateral pelvic wall, sparing midline attachments.   I located a swath of peritoneum going into a hernia fascial defect at the internal  ring consistent with an indirect inguinal hernia.  I gradually freed the peritoneal hernia sac off safely and reduced it into the preperitoneal space.  I freed the peritoneum off the round ligament.  I freed peritoneum off the retroperitoneum along the psoas muscle.  Patient also had that going into a small but definite femoral hernia as well.  His lymph nodes were reduced and skeletonized off the iliac vessels.    I checked & assured hemostasis.    I turned attention on the opposite side.  I did dissection in a similar, mirror-image fashion. The patient had A smaller but definite left indirect inguinal hernia and a slightly larger but still relatively small left femoral hernia.       In freeing off the hernia sacs I did have a breech in the peritoneum At the right round ligament and base of the hernia sac.  Also a small defect in the left paramedian peritoneum there is a left 5 mm port site.  I closed them using absorbable Vicryl stitch using laparoscopic intracorporeal suturing.   I chose 15x15 cm sheets of ultra-lightweight polypropylene mesh (Ultrapro), one for each side.  I cut a single sigmoid-shaped slit ~6cm from a corner of each mesh.  I placed the meshes into the preperitoneal space & laid them as overlapping diamonds such that at the inferior points, a 6x6 cm corner flap rested in the true anterolateral pelvis, covering the obturator & femoral foramina.   I allowed the bladder to fall back and help tuck the corners of the mesh in.  The medial corners overlapped each other across midline cephalad to the pubic rim.   This provided >2 inch coverage around the hernia.  Because the defects well covered and not particularly large, I did not need tacks to hold the mesh in place  I held the hernia sacs cephalad & evacuated carbon dioxide.  I closed the fascia  With absorbable suture.  I closed the skin using 4-0 monocryl stitch.  Sterile dressings were applied. The patient was extubated & arrived in the  PACU in stable condition..  I had discussed postoperative care with the patient in the holding area.   I did discuss operative findings and postoperative goals / instructions to family as well.  Instructions are written in the chart.  Ardeth SportsmanSteven C. Vartan Kerins, M.D., F.A.C.S. Gastrointestinal and Minimally Invasive Surgery [facility] [address] [place], [place] [address] [phone] Main / Paging

## 2014-05-16 NOTE — Progress Notes (Signed)
Patient up to restroom 3 times. Unable to urinate. Bladder scan showed 139mL. States she feels she will urinate soon. Patient transported to PACU. Report given to Brentfordathy, Charity fundraiserN.

## 2014-05-16 NOTE — Transfer of Care (Signed)
Immediate Anesthesia Transfer of Care Note  Patient: Megan Glover  Procedure(s) Performed: Procedure(s): LAPAROSCOPIC EXPLORATION AND REPAIR OF BILATERAL FEMEROL AND BILATERAL INGUINAL HERNIAS  WITH MESH (Bilateral)  Patient Location: PACU  Anesthesia Type:General  Level of Consciousness: awake, alert  and oriented  Airway & Oxygen Therapy: Patient Spontanous Breathing and Patient connected to face mask oxygen  Post-op Assessment: Report given to PACU RN and Post -op Vital signs reviewed and stable  Post vital signs: Reviewed and stable  Complications: No apparent anesthesia complications

## 2014-05-16 NOTE — Discharge Instructions (Signed)
HERNIA REPAIR: POST OP INSTRUCTIONS ° °1. DIET: Follow a light bland diet the first 24 hours after arrival home, such as soup, liquids, crackers, etc.  Be sure to include lots of fluids daily.  Avoid fast food or heavy meals as your are more likely to get nauseated.  Eat a low fat the next few days after surgery. °2. Take your usually prescribed home medications unless otherwise directed. °3. PAIN CONTROL: °a. Pain is best controlled by a usual combination of three different methods TOGETHER: °i. Ice/Heat °ii. Over the counter pain medication °iii. Prescription pain medication °b. Most patients will experience some swelling and bruising around the hernia(s) such as the bellybutton, groins, or old incisions.  Ice packs or heating pads (30-60 minutes up to 6 times a day) will help. Use ice for the first few days to help decrease swelling and bruising, then switch to heat to help relax tight/sore spots and speed recovery.  Some people prefer to use ice alone, heat alone, alternating between ice & heat.  Experiment to what works for you.  Swelling and bruising can take several weeks to resolve.   °c. It is helpful to take an over-the-counter pain medication regularly for the first few weeks.  Choose one of the following that works best for you: °i. Naproxen (Aleve, etc)  Two 220mg tabs twice a day °ii. Ibuprofen (Advil, etc) Three 200mg tabs four times a day (every meal & bedtime) °iii. Acetaminophen (Tylenol, etc) 325-650mg four times a day (every meal & bedtime) °d. A  prescription for pain medication should be given to you upon discharge.  Take your pain medication as prescribed.  °i. If you are having problems/concerns with the prescription medicine (does not control pain, nausea, vomiting, rash, itching, etc), please call us [phone] to see if we need to switch you to a different pain medicine that will work better for you and/or control your side effect better. °ii. If you need a refill on your pain  medication, please contact your pharmacy.  They will contact our office to request authorization. Prescriptions will not be filled after 5 pm or on week-ends. °4. Avoid getting constipated.  Between the surgery and the pain medications, it is common to experience some constipation.  Increasing fluid intake and taking a fiber supplement (such as Metamucil, Citrucel, FiberCon, MiraLax, etc) 1-2 times a day regularly will usually help prevent this problem from occurring.  A mild laxative (prune juice, Milk of Magnesia, MiraLax, etc) should be taken according to package directions if there are no bowel movements after 48 hours.   °5. Wash / shower every day.  You may shower over the dressings as they are waterproof.   °6. Remove your waterproof bandages 5 days after surgery.  You may leave the incision open to air.  You may replace a dressing/Band-Aid to cover the incision for comfort if you wish.  Continue to shower over incision(s) after the dressing is off. ° ° ° °7. ACTIVITIES as tolerated:   °a. You may resume regular (light) daily activities beginning the next day--such as daily self-care, walking, climbing stairs--gradually increasing activities as tolerated.  If you can walk 30 minutes without difficulty, it is safe to try more intense activity such as jogging, treadmill, bicycling, low-impact aerobics, swimming, etc. °b. Save the most intensive and strenuous activity for last such as sit-ups, heavy lifting, contact sports, etc  Refrain from any heavy lifting or straining until you are off narcotics for pain control.   °  c. DO NOT PUSH THROUGH PAIN.  Let pain be your guide: If it hurts to do something, don't do it.  Pain is your body warning you to avoid that activity for another week until the pain goes down. °d. You may drive when you are no longer taking prescription pain medication, you can comfortably wear a seatbelt, and you can safely maneuver your car and apply brakes. °e. You may have sexual intercourse  when it is comfortable.  °8. FOLLOW UP in our office °a. Please call [facility] at [phone] to set up an appointment to see your surgeon in the office for a follow-up appointment approximately 2-3 weeks after your surgery. °b. Make sure that you call for this appointment the day you arrive home to insure a convenient appointment time. °9.  IF YOU HAVE DISABILITY OR FAMILY LEAVE FORMS, BRING THEM TO THE OFFICE FOR PROCESSING.  DO NOT GIVE THEM TO YOUR DOCTOR. ° °WHEN TO CALL US [phone]: °1. Poor pain control °2. Reactions / problems with new medications (rash/itching, nausea, etc)  °3. Fever over 101.5 F (38.5 C) °4. Inability to urinate °5. Nausea and/or vomiting °6. Worsening swelling or bruising °7. Continued bleeding from incision. °8. Increased pain, redness, or drainage from the incision ° ° The clinic staff is available to answer your questions during regular business hours (8:30am-5pm).  Please don’t hesitate to call and ask to speak to one of our nurses for clinical concerns.  ° If you have a medical emergency, go to the nearest emergency room or call 911. ° A surgeon from [facility] is always on call at the hospitals in [place] ° °[facility] °[address], [address], [place], [place]  [address] ? ° [address] [address], [place], [place]   [address] °MAIN: [phone] ? TOLL FREE: [phone] ? FAX: [phone] °[email or web address] ° °Managing Pain ° °Pain after surgery or related to activity is often due to strain/injury to muscle, tendon, nerves and/or incisions.  This pain is usually short-term and will improve in a few months.  ° °Many people find it helpful to do the following things TOGETHER to help speed the process of healing and to get back to regular activity more quickly: ° °1. Avoid heavy physical activity °a.  no lifting greater than 20 pounds °b. Do not “push through” the pain.  Listen to your body and avoid positions and maneuvers than  reproduce the pain °c. Walking is okay as tolerated, but go slowly and stop when getting sore.  °d. Remember: If it hurts to do it, then don’t do it! °2. Take Anti-inflammatory medication  °a. Take with food/snack around the clock for 1-2 weeks °i. This helps the muscle and nerve tissues become less irritable and calm down faster °b. Choose ONE of the following over-the-counter medications: °i. Naproxen 220mg tabs (ex. Aleve) 1-2 pills twice a day  °ii. Ibuprofen 200mg tabs (ex. Advil, Motrin) 3-4 pills with every meal and just before bedtime °iii. Acetaminophen 500mg tabs (Tylenol) 1-2 pills with every meal and just before bedtime °3. Use a Heating pad or Ice/Cold Pack °a. 4-6 times a day °b. May use warm bath/hottub  or showers °4. Try Gentle Massage and/or Stretching  °a. at the area of pain many times a day °b. stop if you feel pain - do not overdo it ° °Try these steps together to help you body heal faster and avoid making things get worse.  Doing just one of these   things may not be enough.   ° °If you are not getting better after two weeks or are noticing you are getting worse, contact our office for further advice; we may need to re-evaluate you & see what other things we can do to help. ° °GETTING TO GOOD BOWEL HEALTH. °Irregular bowel habits such as constipation and diarrhea can lead to many problems over time.  Having one soft bowel movement a day is the most important way to prevent further problems.  The anorectal canal is designed to handle stretching and feces to safely manage our ability to get rid of solid waste (feces, poop, stool) out of our body.  BUT, hard constipated stools can act like ripping concrete bricks and diarrhea can be a burning fire to this very sensitive area of our body, causing inflamed hemorrhoids, anal fissures, increasing risk is perirectal abscesses, abdominal pain/bloating, an making irritable bowel worse.     °The goal: ONE SOFT BOWEL MOVEMENT A DAY!  To have soft, regular  bowel movements:  °• Drink at least 8 tall glasses of water a day.   °• Take plenty of fiber.  Fiber is the undigested part of plant food that passes into the colon, acting s “natures broom” to encourage bowel motility and movement.  Fiber can absorb and hold large amounts of water. This results in a larger, bulkier stool, which is soft and easier to pass. Work gradually over several weeks up to 6 servings a day of fiber (25g a day even more if needed) in the form of: °o Vegetables -- Root (potatoes, carrots, turnips), leafy green (lettuce, salad greens, celery, spinach), or cooked high residue (cabbage, broccoli, etc) °o Fruit -- Fresh (unpeeled skin & pulp), Dried (prunes, apricots, cherries, etc ),  or stewed ( applesauce)  °o Whole grain breads, pasta, etc (whole wheat)  °o Bran cereals  °• Bulking Agents -- This type of water-retaining fiber generally is easily obtained each day by one of the following:  °o Psyllium bran -- The psyllium plant is remarkable because its ground seeds can retain so much water. This product is available as Metamucil, Konsyl, Effersyllium, Per Diem Fiber, or the less expensive generic preparation in drug and health food stores. Although labeled a laxative, it really is not a laxative.  °o Methylcellulose -- This is another fiber derived from wood which also retains water. It is available as Citrucel. °o Polyethylene Glycol - and “artificial” fiber commonly called Miralax or Glycolax.  It is helpful for people with gassy or bloated feelings with regular fiber °o Flax Seed - a less gassy fiber than psyllium °• No reading or other relaxing activity while on the toilet. If bowel movements take longer than 5 minutes, you are too constipated °• AVOID CONSTIPATION.  High fiber and water intake usually takes care of this.  Sometimes a laxative is needed to stimulate more frequent bowel movements, but  °• Laxatives are not a good long-term solution as it can wear the colon out. °o Osmotics  (Milk of Magnesia, Fleets phosphosoda, Magnesium citrate, MiraLax, GoLytely) are safer than  °o Stimulants (Senokot, Castor Oil, Dulcolax, Ex Lax)    °o Do not take laxatives for more than 7days in a row. °•  IF SEVERELY CONSTIPATED, try a Bowel Retraining Program: °o Do not use laxatives.  °o Eat a diet high in roughage, such as bran cereals and leafy vegetables.  °o Drink six (6) ounces of prune or apricot juice each morning.  °o Eat   two (2) large servings of stewed fruit each day.  °o Take one (1) heaping tablespoon of a psyllium-based bulking agent twice a day. Use sugar-free sweetener when possible to avoid excessive calories.  °o Eat a normal breakfast.  °o Set aside 15 minutes after breakfast to sit on the toilet, but do not strain to have a bowel movement.  °o If you do not have a bowel movement by the third day, use an enema and repeat the above steps.  °• Controlling diarrhea °o Switch to liquids and simpler foods for a few days to avoid stressing your intestines further. °o Avoid dairy products (especially milk & ice cream) for a short time.  The intestines often can lose the ability to digest lactose when stressed. °o Avoid foods that cause gassiness or bloating.  Typical foods include beans and other legumes, cabbage, broccoli, and dairy foods.  Every person has some sensitivity to other foods, so listen to our body and avoid those foods that trigger problems for you. °o Adding fiber (Citrucel, Metamucil, psyllium, Miralax) gradually can help thicken stools by absorbing excess fluid and retrain the intestines to act more normally.  Slowly increase the dose over a few weeks.  Too much fiber too soon can backfire and cause cramping & bloating. °o Probiotics (such as active yogurt, Align, etc) may help repopulate the intestines and colon with normal bacteria and calm down a sensitive digestive tract.  Most studies show it to be of mild help, though, and such products can be costly. °o Medicines: °- Bismuth  subsalicylate (ex. Kayopectate, Pepto Bismol) every 30 minutes for up to 6 doses can help control diarrhea.  Avoid if pregnant. °- Loperamide (Immodium) can slow down diarrhea.  Start with two tablets (4mg total) first and then try one tablet every 6 hours.  Avoid if you are having fevers or severe pain.  If you are not better or start feeling worse, stop all medicines and call your doctor for advice °o Call your doctor if you are getting worse or not better.  Sometimes further testing (cultures, endoscopy, X-ray studies, bloodwork, etc) may be needed to help diagnose and treat the cause of the diarrhea. ° °Hernia °A hernia occurs when an internal organ pushes out through a weak spot in the abdominal wall. Hernias most commonly occur in the groin and around the navel. Hernias often can be pushed back into place (reduced). Most hernias tend to get worse over time. Some abdominal hernias can get stuck in the opening (irreducible or incarcerated hernia) and cannot be reduced. An irreducible abdominal hernia which is tightly squeezed into the opening is at risk for impaired blood supply (strangulated hernia). A strangulated hernia is a medical emergency. Because of the risk for an irreducible or strangulated hernia, surgery may be recommended to repair a hernia. °CAUSES  °· Heavy lifting. °· Prolonged coughing. °· Straining to have a bowel movement. °· A cut (incision) made during an abdominal surgery. °HOME CARE INSTRUCTIONS  °· Bed rest is not required. You may continue your normal activities. °· Avoid lifting more than 10 pounds (4.5 kg) or straining. °· Cough gently. If you are a smoker it is best to stop. Even the best hernia repair can break down with the continual strain of coughing. Even if you do not have your hernia repaired, a cough will continue to aggravate the problem. °· Do not wear anything tight over your hernia. Do not try to keep it in with an outside bandage or   truss. These can damage abdominal  contents if they are trapped within the hernia sac. °· Eat a normal diet. °· Avoid constipation. Straining over long periods of time will increase hernia size and encourage breakdown of repairs. If you cannot do this with diet alone, stool softeners may be used. °SEEK IMMEDIATE MEDICAL CARE IF:  °· You have a fever. °· You develop increasing abdominal pain. °· You feel nauseous or vomit. °· Your hernia is stuck outside the abdomen, looks discolored, feels hard, or is tender. °· You have any changes in your bowel habits or in the hernia that are unusual for you. °· You have increased pain or swelling around the hernia. °· You cannot push the hernia back in place by applying gentle pressure while lying down. °MAKE SURE YOU:  °· Understand these instructions. °· Will watch your condition. °· Will get help right away if you are not doing well or get worse. °Document Released: 08/01/2005 Document Revised: 10/24/2011 Document Reviewed: 03/20/2008 °[employer]® Patient Information ©2015 [employer]. This information is not intended to replace advice given to you by your health care provider. Make sure you discuss any questions you have with your health care provider. ° °

## 2014-05-19 ENCOUNTER — Encounter (HOSPITAL_COMMUNITY): Payer: Self-pay | Admitting: Surgery

## 2014-05-20 ENCOUNTER — Encounter (INDEPENDENT_AMBULATORY_CARE_PROVIDER_SITE_OTHER): Payer: Self-pay | Admitting: Surgery

## 2014-05-30 ENCOUNTER — Other Ambulatory Visit: Payer: Self-pay

## 2015-02-09 ENCOUNTER — Other Ambulatory Visit: Payer: Self-pay

## 2015-11-23 ENCOUNTER — Encounter: Payer: Self-pay | Admitting: Family Medicine

## 2015-11-23 ENCOUNTER — Ambulatory Visit (INDEPENDENT_AMBULATORY_CARE_PROVIDER_SITE_OTHER): Payer: Medicare Other | Admitting: Family Medicine

## 2015-11-23 VITALS — BP 140/80 | HR 66 | Temp 97.7°F | Ht 67.0 in | Wt 167.2 lb

## 2015-11-23 DIAGNOSIS — H6122 Impacted cerumen, left ear: Secondary | ICD-10-CM

## 2015-11-23 DIAGNOSIS — J45909 Unspecified asthma, uncomplicated: Secondary | ICD-10-CM

## 2015-11-23 DIAGNOSIS — K581 Irritable bowel syndrome with constipation: Secondary | ICD-10-CM

## 2015-11-23 DIAGNOSIS — J4489 Other specified chronic obstructive pulmonary disease: Secondary | ICD-10-CM

## 2015-11-23 DIAGNOSIS — J449 Chronic obstructive pulmonary disease, unspecified: Secondary | ICD-10-CM

## 2015-11-23 DIAGNOSIS — I739 Peripheral vascular disease, unspecified: Secondary | ICD-10-CM | POA: Insufficient documentation

## 2015-11-23 DIAGNOSIS — K648 Other hemorrhoids: Secondary | ICD-10-CM

## 2015-11-23 DIAGNOSIS — E559 Vitamin D deficiency, unspecified: Secondary | ICD-10-CM | POA: Diagnosis not present

## 2015-11-23 DIAGNOSIS — E038 Other specified hypothyroidism: Secondary | ICD-10-CM

## 2015-11-23 DIAGNOSIS — I10 Essential (primary) hypertension: Secondary | ICD-10-CM

## 2015-11-23 DIAGNOSIS — F419 Anxiety disorder, unspecified: Secondary | ICD-10-CM

## 2015-11-23 DIAGNOSIS — K644 Residual hemorrhoidal skin tags: Secondary | ICD-10-CM

## 2015-11-23 LAB — COMPREHENSIVE METABOLIC PANEL
ALBUMIN: 4.3 g/dL (ref 3.5–5.2)
ALK PHOS: 63 U/L (ref 39–117)
ALT: 17 U/L (ref 0–35)
AST: 22 U/L (ref 0–37)
BUN: 15 mg/dL (ref 6–23)
CALCIUM: 9.7 mg/dL (ref 8.4–10.5)
CO2: 26 mEq/L (ref 19–32)
Chloride: 101 mEq/L (ref 96–112)
Creatinine, Ser: 0.77 mg/dL (ref 0.40–1.20)
GFR: 78.64 mL/min (ref >60.00)
Glucose, Bld: 97 mg/dL (ref 70–99)
POTASSIUM: 4.4 meq/L (ref 3.5–5.1)
Sodium: 136 mEq/L (ref 135–145)
TOTAL PROTEIN: 7.1 g/dL (ref 6.0–8.3)
Total Bilirubin: 0.6 mg/dL (ref 0.2–1.2)

## 2015-11-23 LAB — LIPID PANEL
CHOLESTEROL: 184 mg/dL (ref 0–200)
HDL: 49.2 mg/dL (ref >39.00)
LDL Cholesterol: 120 mg/dL — ABNORMAL HIGH (ref 0–99)
NONHDL: 134.51
Total CHOL/HDL Ratio: 4
Triglycerides: 74 mg/dL (ref 0.0–149.0)
VLDL: 14.8 mg/dL (ref 0.0–40.0)

## 2015-11-23 LAB — VITAMIN D 25 HYDROXY (VIT D DEFICIENCY, FRACTURES): VITD: 36.82 ng/mL (ref 30.00–100.00)

## 2015-11-23 LAB — T4, FREE: Free T4: 1.33 ng/dL (ref 0.60–1.60)

## 2015-11-23 LAB — TSH: TSH: 1.95 u[IU]/mL (ref 0.35–4.50)

## 2015-11-23 MED ORDER — CLONAZEPAM 0.5 MG PO TABS: ORAL_TABLET | ORAL | Status: DC

## 2015-11-23 MED ORDER — HYDROCORTISONE 2.5 % RE CREA: 1.0000 "application " | TOPICAL_CREAM | Freq: Two times a day (BID) | RECTAL | Status: DC

## 2015-11-23 MED ORDER — CLONAZEPAM 0.5 MG PO TABS
ORAL_TABLET | ORAL | Status: DC
Start: 2015-11-23 — End: 2016-03-08

## 2015-11-23 NOTE — Progress Notes (Addendum)
Subjective:    Patient ID: Megan Glover, female    DOB: [birth year], 70 y.o.   MRN: [id number]  HPI  Ms.   Megan Glover is a 70 y.o.female here today to re-establish care with me. She is former pt of [facility] and now establishing with [facility].  She has hx of HTN,PAD,fibromyalgia, OA, anxiety disorder among some.  Hypertension: Currently she is on Losartan 100 mg daily, Hydralazine 10 mg tid, and Bystolic 10 mg bid. She has tried other medications but has not tolerated well, including ACEI,CCB, and other BB's. She is taking medications as instructed, no side effects reported.  She has not noted unusual headache, visual changes, exertional chest pain, dyspnea,  dyspnea, focal weakness, or edema. Tolerating medications well with no major side effects reported.  Hx of PAD, she denies any claudication, cold extremity,or leg pain. Incidental finding on CT of abdomen and pelvis, initially ordered as part of work up for RLQ abdominal pain, s/p hernia repair. 12/10/13 CT angiogram: RIGHT Lower Extremity:  Inflow: Extensive calcified atherosclerotic plaque results and moderate to advanced stenosis at the origin and proximal aspect of the common iliac artery. The internal iliac artery is patent. The external iliac artery is relatively spared of disease.  Outflow: Trace atherosclerotic calcification along the posterior aspect of the common femoral artery. The profunda femoral artery is widely patent. The superficial femoral artery is widely patent and relatively free of disease. The popliteal artery is relatively patent and disease free.    Hemorrhoids: Long hx of external hemorrhoids, she is requesting Rx medication for burning sensation and irritation. Colonoscopy 2013, according to pt, otherwise negative except for hemorrhoids. Last time she had rectal bleeding 2 days ago, usually associated with straining and with bulky stool and dyschezia. She has used OTC medications  with little help. IBS-C: She has tried pharmacologic treatment but did not tolerate well. Symptoms have been stable with 4 oz of prune juice daily, if she drinks more she develops diarrhea. Diffuse abdominal pain is mild, intermittently and unchanged.  Hypothyroidism: No changes in chronic fatigue or constipation. No tachycardia, abnormal wt changes, or tremor. Currently she is on Synthroid 50 mcg daily.   Anxiety: On Buspar 15 mg and Clonazepam. She is planning on travelling around the country for 2 months and leaving the 3rd week of May; so she is concerned about her Clonazepam Rx. Symptoms are stable, denies depressed mood or suicidal thoughts.   Fibromyalgia and Vit D deficiency: She is on Flexeril 10 mg tid as needed and Vit D OTC 1000 U daily. She is exercising regularly, walking the treadmill 4 times per week for about 30 min at the time.   COPD/asthma and seasonal allergies: She is currently on Proair inh, which she needs about 2 times per week or less. No cough, wheezing, or dyspnea. + Nasal congestion and rhinorrhea during this time of the year. No fever or chills. No sick contact.  Last time she had labs was 5-6 months ago.  Today she is also c/o 2 months of vaginal burning sensation, intermittent, no dysuria , no vaginal discharge or bleeding, no sexually active.  Left ear discomfort, denies changes in hearing.    Review of Systems  Constitutional: Positive for fatigue (no more than usual). Negative for fever, activity change, appetite change and unexpected weight change.  HENT: Positive for congestion, ear pain and rhinorrhea. Negative for ear discharge, facial swelling, mouth sores, nosebleeds, sinus pressure, sore throat and trouble swallowing.  Left ear pressure sensation and "popping" x 2 weeks  Eyes: Positive for itching. Negative for redness and visual disturbance.  Respiratory: Negative for cough, shortness of breath and wheezing.   Cardiovascular: Negative  for chest pain, palpitations and leg swelling.  Gastrointestinal: Positive for constipation and anal bleeding. Negative for nausea, vomiting and abdominal pain.       Negative for changes in bowel habits.  Genitourinary: Negative for dysuria, hematuria, decreased urine volume, vaginal bleeding, vaginal discharge, difficulty urinating and pelvic pain.       Vaginal burning sensation   Musculoskeletal: Positive for myalgias and arthralgias. Negative for neck pain.       Hx of OA and fibromyalgia.   Neurological: Negative for seizures, syncope, weakness, numbness and headaches.  Hematological: Negative for adenopathy. Does not bruise/bleed easily.  Psychiatric/Behavioral: Negative for suicidal ideas and confusion. The patient is nervous/anxious (stable).    Current Outpatient Prescriptions on File Prior to Visit  Medication Sig Dispense Refill  . acetaminophen (TYLENOL) 500 MG tablet Take 2 tablets (1,000 mg total) by mouth every 8 (eight) hours as needed for moderate pain. 30 tablet 0  . albuterol (PROVENTIL HFA;VENTOLIN HFA) 108 (90 BASE) MCG/ACT inhaler Inhale 2 puffs into the lungs every 8 (eight) hours as needed for wheezing or shortness of breath.    . B Complex-C (B-COMPLEX WITH VITAMIN C) tablet Take 1 tablet by mouth daily.    . Biotin (PA BIOTIN) 1000 MCG tablet Take 1,000 mcg by mouth daily.    . cholecalciferol (VITAMIN D) 1000 UNITS tablet Take 2,000 Units by mouth daily.    . cyclobenzaprine (FLEXERIL) 10 MG tablet Take 10 mg by mouth daily as needed for muscle spasms.     . hydrALAZINE (APRESOLINE) 10 MG tablet Take 10 mg by mouth 3 (three) times daily.    Marland Kitchen levothyroxine (SYNTHROID) 50 MCG tablet Take 50 mcg by mouth daily before breakfast.    . losartan (COZAAR) 100 MG tablet Take 100 mg by mouth every morning.     . magnesium oxide (MAG-OX) 400 MG tablet Take 400 mg by mouth daily.    . Multiple Vitamins-Minerals (ZINC PO) Take 1 capsule by mouth daily.    . Omega-3 Fatty  Acids (FISH OIL) 1000 MG CAPS Take 1 capsule by mouth daily.    Marland Kitchen OVER THE COUNTER MEDICATION Apply 2 drops to eye at bedtime as needed (for itchy dry eyes.).    Marland Kitchen oxyCODONE (OXY IR/ROXICODONE) 5 MG immediate release tablet Take 1-2 tablets (5-10 mg total) by mouth every 4 (four) hours as needed for moderate pain, severe pain or breakthrough pain ((for MODERATE breakthrough pain)). 50 tablet 0   No current facility-administered medications on file prior to visit.     Past Medical History  Diagnosis Date  . Hypertension   . Thyroid disease   . History of diverticulosis   . Fibromyalgia   . Hypothyroidism   . IBS (irritable bowel syndrome)   . Clostridium difficile diarrhea     05-09-14 denies any problems at this time  . Anxiety   . Depression   . Anemia   . Cataract     bilateral -left > right    Social History   Social History  . Marital Status: Married    Spouse Name: Harlem Thresher  . Number of Children: N/A  . Years of Education: N/A   Social History Main Topics  . Smoking status: Former Smoker    Types: Cigarettes  Quit date: 05/09/1980  . Smokeless tobacco: Never Used  . Alcohol Use: No  . Drug Use: No  . Sexual Activity: Not Currently   Other Topics Concern  . None   Social History Narrative    Filed Vitals:   11/23/15 1101  BP: 140/80  Pulse: 66  Temp: 97.7 F (36.5 C)   Body mass index is 26.19 kg/(m^2).      Objective:   Physical Exam  Constitutional: She is oriented to person, place, and time. She appears well-developed and well-nourished. No distress.  HENT:  Head: Atraumatic.  Right Ear: No tenderness.  Left Ear: No tenderness.  Mouth/Throat: Oropharynx is clear and moist. No oropharyngeal exudate.  Cerumen excess bilateral, L>R. R TM seen partially, cannot see left one.  Eyes: Conjunctivae and EOM are normal. Pupils are equal, round, and reactive to light.  Neck: Neck supple. No thyromegaly present.  Cardiovascular: Normal rate and  regular rhythm.   No murmur heard. Pulses:      Dorsalis pedis pulses are 2+ on the right side, and 2+ on the left side.       Posterior tibial pulses are 2+ on the right side, and 2+ on the left side.  Pulmonary/Chest: Effort normal and breath sounds normal. She has no wheezes. She has no rales.  Abdominal: Soft. She exhibits no mass. There is no tenderness.  Genitourinary:  Rectal exam refused.  Musculoskeletal: She exhibits no edema.  Lymphadenopathy:    She has no cervical adenopathy.  Neurological: She is alert and oriented to person, place, and time. No cranial nerve deficit. Coordination normal.  Stable gait with no assistance  Psychiatric: She has a normal mood and affect.  Well groomed and good eye contact       Assessment & Plan:    Megan Glover was seen today for new patient (initial visit).  Diagnoses and all orders for this visit:  Essential hypertension Adequately controlled for her. BP in the past 150/90, poor medication tolerance. No changes in current management. DASH diet recommended. Eye exam recommended annually. F/U in 5 months.  -     Comprehensive metabolic panel; Standing -     Lipid panel; Standing -     Comprehensive metabolic panel -     Lipid panel  Other specified hypothyroidism No changes in current management, will follow lab result and adjust management accordingly. F/U in 6-12 months.  -     TSH; Standing -     T4, free; Standing -     TSH -     T4, free  Irritable bowel syndrome with constipation Stable, has not tolerated medications, so continue with dietary management and prune juice.  Anxiety Stable. No changes in current management. Post dated Rx's given. She understands side effects of benzo meds at her age. F/U in 4-5 months.  -     clonazePAM (KLONOPIN) 0.5 MG tablet; Take one tablet by mouth twice daily at 10am and 10pm.  Vitamin D deficiency No changes in current management, will follow lab result and adjust management  accordingly.  -     VITAMIN D 25 Hydroxy (Vit-D Deficiency, Fractures); Standing -     Comprehensive metabolic panel; Standing  Hemorrhoids, external Long Hx, colonoscopy in 2-13, stable overall. Avoid straining and prolonged toilet time. Bath sitz as needed. Topical steroid side effects discussed and recommended as needed. F/U as needed.  -     hydrocortisone (ANUSOL-HC) 2.5 % rectal cream; Place 1 application rectally 2 (two) times  daily.  PAD (peripheral artery disease) (HCC) Asymptomatic. Will consider statin, which she does not remember trying before, denies Hx of HLD. She is not on Aspirin, she can take 81 mg daily. She has taken it as needed for pain.  -     Lipid panel; Standing   Cerumen impaction, left OTC Debrox or mineral oil. No q tips. F/U as needed.  COPD with asthma (HCC) Stable. Former smoker. No changes in current management.   Today I could not do a pelvic examination. Recommended OTC [place]. I think it may be vaginal atrophy, if symptoms persist and pelvic examination is needed, instructed to set up appt sooner if needed.    Chemistry      Component Value Date/Time   NA 136 11/23/2015 1253   K 4.4 11/23/2015 1253   CL 101 11/23/2015 1253   CO2 26 11/23/2015 1253   BUN 15 11/23/2015 1253   CREATININE 0.77 11/23/2015 1253      Component Value Date/Time   CALCIUM 9.7 11/23/2015 1253   ALKPHOS 63 11/23/2015 1253   AST 22 11/23/2015 1253   ALT 17 11/23/2015 1253   BILITOT 0.6 11/23/2015 1253     Lab Results  Component Value Date   CHOL 184 11/23/2015   HDL 49.20 11/23/2015   LDLCALC 120* 11/23/2015   TRIG 74.0 11/23/2015   CHOLHDL 4 11/23/2015   Lab Results  Component Value Date   TSH 1.95 11/23/2015     Priscille Shadduck G. [place], MD  [facility]. [place] office.

## 2015-11-23 NOTE — Patient Instructions (Signed)

## 2015-11-23 NOTE — Progress Notes (Signed)
Pre visit review using our clinic review tool, if applicable. No additional management support is needed unless otherwise documented below in the visit note. 

## 2015-11-24 ENCOUNTER — Encounter: Payer: Self-pay | Admitting: Family Medicine

## 2015-11-25 ENCOUNTER — Encounter: Payer: Self-pay | Admitting: Family Medicine

## 2015-11-25 ENCOUNTER — Other Ambulatory Visit: Payer: Self-pay | Admitting: Family Medicine

## 2015-11-25 MED ORDER — ATORVASTATIN CALCIUM 20 MG PO TABS: 20.0000 mg | ORAL_TABLET | Freq: Every day | ORAL | Status: DC

## 2015-11-25 MED ORDER — NEBIVOLOL HCL 10 MG PO TABS: 10.0000 mg | ORAL_TABLET | Freq: Two times a day (BID) | ORAL | Status: DC

## 2015-11-25 NOTE — Addendum Note (Signed)
Addended by: [place], Kiree Dejarnette G on: 11/25/2015 05:57 PM   Modules accepted: Orders

## 2015-11-27 ENCOUNTER — Encounter: Payer: Self-pay | Admitting: Family Medicine

## 2015-11-29 ENCOUNTER — Encounter: Payer: Self-pay | Admitting: Family Medicine

## 2015-11-30 ENCOUNTER — Other Ambulatory Visit: Payer: Self-pay | Admitting: Family Medicine

## 2015-11-30 DIAGNOSIS — M797 Fibromyalgia: Secondary | ICD-10-CM

## 2015-11-30 MED ORDER — CYCLOBENZAPRINE HCL 10 MG PO TABS: 10.0000 mg | ORAL_TABLET | Freq: Every day | ORAL | Status: DC | PRN

## 2015-12-14 ENCOUNTER — Encounter: Payer: Self-pay | Admitting: Family Medicine

## 2015-12-15 ENCOUNTER — Encounter: Payer: Self-pay | Admitting: Family Medicine

## 2015-12-15 ENCOUNTER — Other Ambulatory Visit: Payer: Self-pay | Admitting: Family Medicine

## 2015-12-15 DIAGNOSIS — I10 Essential (primary) hypertension: Secondary | ICD-10-CM

## 2015-12-15 MED ORDER — HYDRALAZINE HCL 10 MG PO TABS: 10.0000 mg | ORAL_TABLET | Freq: Three times a day (TID) | ORAL | Status: DC

## 2015-12-16 ENCOUNTER — Other Ambulatory Visit: Payer: Self-pay | Admitting: Family Medicine

## 2015-12-16 DIAGNOSIS — I10 Essential (primary) hypertension: Secondary | ICD-10-CM

## 2015-12-16 MED ORDER — HYDRALAZINE HCL 10 MG PO TABS: 10.0000 mg | ORAL_TABLET | Freq: Three times a day (TID) | ORAL | Status: DC

## 2015-12-16 NOTE — Telephone Encounter (Signed)
Called pharmacy to cancel refill and pharmacy confirmed patient had already picked up refill. They did confirm the hydralazine was a 10 mg tablet and instructions were to take 1 tablet by mouth 3x daily. Please advise if further action is needed. Paper copy of refill is upfront in 'Patient Pickup' bin.

## 2015-12-24 ENCOUNTER — Encounter: Payer: Self-pay | Admitting: Family Medicine

## 2015-12-24 ENCOUNTER — Other Ambulatory Visit: Payer: Self-pay

## 2015-12-24 DIAGNOSIS — I10 Essential (primary) hypertension: Secondary | ICD-10-CM

## 2015-12-24 MED ORDER — HYDRALAZINE HCL 10 MG PO TABS: 10.0000 mg | ORAL_TABLET | Freq: Three times a day (TID) | ORAL | Status: DC

## 2016-03-08 ENCOUNTER — Other Ambulatory Visit: Payer: Self-pay | Admitting: Family Medicine

## 2016-03-08 DIAGNOSIS — F419 Anxiety disorder, unspecified: Secondary | ICD-10-CM

## 2016-03-09 MED ORDER — CLONAZEPAM 0.5 MG PO TABS: ORAL_TABLET | ORAL | 3 refills | Status: DC

## 2016-03-09 NOTE — Telephone Encounter (Signed)
I seem like she last fill Rx 02/13/16, so she is due 03/13/16. Rx for Clonazepam 0.5 mg bid can be called in to fill 03/13/16 # 60/3.  Thanks BJ

## 2016-03-09 NOTE — Telephone Encounter (Signed)
Rx called in to pharmacy. 

## 2016-03-10 NOTE — Addendum Note (Signed)
Addended by: Marcell Anger E on: 03/10/2016 09:32 AM   Modules accepted: Orders

## 2016-03-10 NOTE — Telephone Encounter (Signed)
Rx called in 7/26.

## 2016-04-26 ENCOUNTER — Ambulatory Visit: Payer: PRIVATE HEALTH INSURANCE | Admitting: Family Medicine

## 2016-05-15 ENCOUNTER — Encounter: Payer: Self-pay | Admitting: Family Medicine

## 2016-05-16 ENCOUNTER — Other Ambulatory Visit: Payer: Self-pay

## 2016-05-16 MED ORDER — LOSARTAN POTASSIUM 100 MG PO TABS: 100.0000 mg | ORAL_TABLET | Freq: Every morning | ORAL | 1 refills | Status: DC

## 2016-05-19 ENCOUNTER — Ambulatory Visit (INDEPENDENT_AMBULATORY_CARE_PROVIDER_SITE_OTHER): Payer: Medicare Other | Admitting: Family Medicine

## 2016-05-19 ENCOUNTER — Encounter: Payer: Self-pay | Admitting: Family Medicine

## 2016-05-19 VITALS — BP 130/80 | HR 69 | Temp 97.6°F | Resp 12 | Ht 67.0 in | Wt 166.2 lb

## 2016-05-19 DIAGNOSIS — E559 Vitamin D deficiency, unspecified: Secondary | ICD-10-CM | POA: Diagnosis not present

## 2016-05-19 DIAGNOSIS — M797 Fibromyalgia: Secondary | ICD-10-CM

## 2016-05-19 DIAGNOSIS — I739 Peripheral vascular disease, unspecified: Secondary | ICD-10-CM

## 2016-05-19 DIAGNOSIS — F411 Generalized anxiety disorder: Secondary | ICD-10-CM | POA: Diagnosis not present

## 2016-05-19 DIAGNOSIS — I10 Essential (primary) hypertension: Secondary | ICD-10-CM

## 2016-05-19 DIAGNOSIS — K581 Irritable bowel syndrome with constipation: Secondary | ICD-10-CM

## 2016-05-19 LAB — BASIC METABOLIC PANEL WITH GFR
BUN: 14 mg/dL (ref 6–23)
CO2: 27 meq/L (ref 19–32)
Calcium: 9.3 mg/dL (ref 8.4–10.5)
Chloride: 101 meq/L (ref 96–112)
Creatinine, Ser: 0.72 mg/dL (ref 0.40–1.20)
GFR: 84.86 mL/min
Glucose, Bld: 93 mg/dL (ref 70–99)
Potassium: 4.6 meq/L (ref 3.5–5.1)
Sodium: 136 meq/L (ref 135–145)

## 2016-05-19 LAB — VITAMIN D 25 HYDROXY (VIT D DEFICIENCY, FRACTURES): VITD: 38.05 ng/mL (ref 30.00–100.00)

## 2016-05-19 MED ORDER — NEBIVOLOL HCL 20 MG PO TABS: 10.0000 mg | ORAL_TABLET | Freq: Two times a day (BID) | ORAL | 2 refills | Status: DC

## 2016-05-19 NOTE — Patient Instructions (Addendum)
A few things to remember from today's visit:   [name], today we have followed on some of your chronic medical problems and they seem to be stable, so no changes in current management today.  Review medication list and be sure if is accurate. No changes today.  -Remember a healthy diet and regular physical activity are very important for prevention as well as for well being; they also help with many chronic problems, decreasing the need of adding new medications and delaying or preventing possible complications.  Remember to arrange your follow up appt before leaving today. Please follow sooner than planned if a new concern arises.   We have ordered labs or studies at this visit.  It can take up to 1-2 weeks for results and processing. IF results require follow up or explanation, we will call you with instructions.   Medicare covers a annual preventive visit, which is strongly recommended , it is once per year and involves a series of questions to identify risk factors; so we can try to prevent possible complications. This does not need to be done by a doctor.  We have a nurse Banker(RN) here that is highly qualified to do it, it can be arrange same date you have a follow up appointment with me or labs scheduled, and it 100% covered by Medicare. So before you leave today I would like for you to arrange visit with Ms Megan CircleSusan Glover for Medicare wellness visit.  Cautions with falls.    Vitamin D deficiency  PAD (peripheral artery disease) (HCC)  Essential hypertension  Other specified hypothyroidism - Plan: Nebivolol HCl (BYSTOLIC) 20 MG TABS  Generalized anxiety disorder  Fibromyalgia   Please be sure medication list is accurate. If a new problem present, please set up appointment sooner than planned today.

## 2016-05-19 NOTE — Progress Notes (Signed)
HPI:   Megan Glover is a 70 y.o. female, who is here today to follow on some of her chronic medical problems.    New concerns today: none  She had eye examination yesterday because left periocular edema and erythema, Rx Augmentin, today greatly better. Improved with warm compresses. Abx is causing some GI symptoms, Hx of IBS, intermittent mild cramps, exacerbated by food intake and alleviated by avoiding food intake/rest. She has not noted blood in stool, has had it before (related to hemorrhoids). She is also on probiotic.  Hx of IBS-C, which has been well controlled with diet starting management and no juice 4 ounces daily.  History of vitamin D deficiency, currently she is on vitamin D 2000 units. Fibromyalgia, exacerbated recently by prolonged sitting and walking during 6 weeks travel around the country. She takes Flexeril as needed, she has not needed Percocet. History of generalized osteoarthritis (mainly shoulder,back, and hips) and unstable gait;she is using her cane, she denies any fall since the last office visit.   Hypertension:  Currently on Bystolic 20 mg 1/2 tab bid, Cozaar 100 mg daily, and Hydralazine 10 mg tid. She has tried many other antihypertensive medications in the past and has not tolerated well, current regimen has controlled her BP and has been well tolerated.    She has not noted unusual headache, visual changes, exertional chest pain or dyspnea,  focal weakness, or edema.   Lab Results  Component Value Date   CREATININE 0.77 11/23/2015   BUN 15 11/23/2015   NA 136 11/23/2015   K 4.4 11/23/2015   CL 101 11/23/2015   CO2 26 11/23/2015   Hx of PAD, she is on Aspirin 81 mg daily. She has not tolerated stains in the past, tried again a few months ago but exacerbated myalgias and arthralgias.  She follows a healthy diet. Exercises regularly.  She denies claudication like symptoms, cyanosis, or cold extremities.  Occasionally  she gets muscle leg cramps at night and takes OTC Mg Oxide as needed.  Lab Results  Component Value Date   CHOL 184 11/23/2015   HDL 49.20 11/23/2015   LDLCALC 120 (H) 11/23/2015   TRIG 74.0 11/23/2015   CHOLHDL 4 11/23/2015   CTA abdominal aorta and bifemoral 11/2013: : 1. No evidence of acute vascular abnormality. 2. 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 3. Moderate stenosis of the proximal left common iliac artery. 4. The bilateral outflow and runoff vessels are relatively disease free.   Anxiety disorder, currently she is on Clonazepam 0.5 mg twice daily as needed. She has tried different medications in the past, discontinued because not well tolerated. Clonazepam also helps her sleep.  She denies depressed mood or suicidal thoughts. Exacerbated by stress due to son's illness.  In general she is tolerating medications well, denies any side effect.    Review of Systems  Constitutional: Positive for fatigue (no more than usual). Negative for activity change, appetite change, fever and unexpected weight change.  HENT: Negative for mouth sores, nosebleeds and trouble swallowing.   Eyes: Negative for photophobia, pain and visual disturbance.  Respiratory: Negative for cough, shortness of breath and wheezing.   Cardiovascular: Negative for chest pain, palpitations and leg swelling.  Gastrointestinal: Positive for abdominal pain. Negative for nausea and vomiting.       Negative for changes in bowel habits.  Genitourinary: Negative for decreased urine volume, difficulty urinating, dysuria and hematuria.  Musculoskeletal: Positive for arthralgias, back pain, gait problem and myalgias.  Skin: Negative for rash and wound.  Neurological: Negative for syncope, weakness and headaches.  Psychiatric/Behavioral: Negative for confusion and sleep disturbance. The patient is nervous/anxious.       Current Outpatient  Prescriptions on File Prior to Visit  Medication Sig Dispense Refill  . acetaminophen (TYLENOL) 500 MG tablet Take 2 tablets (1,000 mg total) by mouth every 8 (eight) hours as needed for moderate pain. 30 tablet 0  . albuterol (PROVENTIL HFA;VENTOLIN HFA) 108 (90 BASE) MCG/ACT inhaler Inhale 2 puffs into the lungs every 8 (eight) hours as needed for wheezing or shortness of breath.    . B Complex-C (B-COMPLEX WITH VITAMIN C) tablet Take 1 tablet by mouth daily.    . Biotin (PA BIOTIN) 1000 MCG tablet Take 1,000 mcg by mouth daily.    . cholecalciferol (VITAMIN D) 1000 UNITS tablet Take 2,000 Units by mouth daily.    . clonazePAM (KLONOPIN) 0.5 MG tablet Take one tablet by mouth twice daily at 10am and 10pm. 60 tablet 3  . cyclobenzaprine (FLEXERIL) 10 MG tablet Take 1 tablet (10 mg total) by mouth daily as needed for muscle spasms. 90 tablet 1  . hydrALAZINE (APRESOLINE) 10 MG tablet Take 1 tablet (10 mg total) by mouth 3 (three) times daily. 180 tablet 0  . hydrocortisone (ANUSOL-HC) 2.5 % rectal cream Place 1 application rectally 2 (two) times daily. 30 g 1  . levothyroxine (SYNTHROID) 50 MCG tablet Take 50 mcg by mouth daily before breakfast.    . losartan (COZAAR) 100 MG tablet Take 1 tablet (100 mg total) by mouth every morning. 90 tablet 1  . magnesium oxide (MAG-OX) 400 MG tablet Take 400 mg by mouth daily.    . Multiple Vitamins-Minerals (ZINC PO) Take 1 capsule by mouth daily.    . Omega-3 Fatty Acids (FISH OIL) 1000 MG CAPS Take 1 capsule by mouth daily.    Marland Kitchen. OVER THE COUNTER MEDICATION Apply 2 drops to eye at bedtime as needed (for itchy dry eyes.).    Marland Kitchen. oxyCODONE (OXY IR/ROXICODONE) 5 MG immediate release tablet Take 1-2 tablets (5-10 mg total) by mouth every 4 (four) hours as needed for moderate pain, severe pain or breakthrough pain ((for MODERATE breakthrough pain)). 50 tablet 0   No current facility-administered medications on file prior to visit.      Past Medical History:    Diagnosis Date  . Anemia   . Anxiety   . Cataract    bilateral -left > right  . Clostridium difficile diarrhea    05-09-14 denies any problems at this time  . Depression   . Fibromyalgia   . History of diverticulosis   . Hypertension   . Hypothyroidism   . IBS (irritable bowel syndrome)   . Neuromuscular disorder (HCC)    fibromyalgia  . Thyroid disease    Allergies  Allergen Reactions  . Clindamycin/Lincomycin     CDIF  . Demerol [Meperidine] Nausea And Vomiting, Other (See Comments) and Hypertension    Severe headache  . Erythromycin Diarrhea  . Hydrochlorothiazide Other (See Comments)    Weakness/malaise  . Levaquin [Levofloxacin]     Yeast infection  . Spironolactone Other (See Comments)    Weakness/malaise  . Sulfa Antibiotics Other (See Comments)    Anal itching    Social History   Social History  . Marital status: Married    Spouse name: Nancie NeasStewart Cerezo  . Number of children: N/A  .  Years of education: N/A   Social History Main Topics  . Smoking status: Former Smoker    Types: Cigarettes    Quit date: 05/09/1980  . Smokeless tobacco: Never Used  . Alcohol use No  . Drug use: No  . Sexual activity: Not Currently   Other Topics Concern  . None   Social History Narrative  . None    Vitals:   05/19/16 1324  BP: 130/80  Pulse: 69  Resp: 12  Temp: 97.6 F (36.4 C)    Body mass index is 26.04 kg/m.    Physical Exam  Nursing note and vitals reviewed. Constitutional: She is oriented to person, place, and time. She appears well-developed and well-nourished. No distress.  HENT:  Head: Atraumatic.  Mouth/Throat: Oropharynx is clear and moist and mucous membranes are normal.  Eyes: Conjunctivae and EOM are normal. Pupils are equal, round, and reactive to light.    Mild left upper eye lid edema and erythema.  Cardiovascular: Normal rate and regular rhythm.   No murmur heard. Pulses:      Dorsalis pedis pulses are 2+ on the right side, and  2+ on the left side.       Posterior tibial pulses are 2+ on the right side, and 2+ on the left side.  Respiratory: Effort normal and breath sounds normal. No respiratory distress.  GI: Soft. Bowel sounds are normal. She exhibits no mass. There is no hepatomegaly. There is generalized tenderness (mild). There is no rebound and no guarding.  Musculoskeletal: She exhibits no edema.  Some trigger points on upper and lower back, chest wall, and extremities bilateral.  Lymphadenopathy:    She has no cervical adenopathy.  Neurological: She is alert and oriented to person, place, and time. She has normal strength. She displays tremor (mild fine hands and head tremor, with intention, stable). Coordination normal.  SLR negative bilateral. Trouble getting up from chair and some stiffness,once she starts walking is is stable.Assisted with cane.  Skin: Skin is warm. No erythema.  Psychiatric: Her speech is normal. Her mood appears anxious.  Well groomed, good eye contact.      ASSESSMENT AND PLAN:     Kyli was seen today for follow-up.  Diagnoses and all orders for this visit:    PAD (peripheral artery disease) (HCC)  Asymptomatic. Continue Aspirin daily. Did not tolerated statin. Former smoker. Instructed about warning signs. F/U in 6 months.  Essential hypertension  Adequately controlled. No changes in current management. DASH-low salt diet to continue. F/U in 6 months, before if needed.  -     Nebivolol HCl (BYSTOLIC) 20 MG TABS; Take 0.5 tablets (10 mg total) by mouth 2 (two) times daily. -     Basic Metabolic Panel  Vitamin D deficiency  No changes in current management, will follow labs done today and will give further recommendations accordingly.  -     VITAMIN D 25 Hydroxy (Vit-D Deficiency, Fractures)  Irritable bowel syndrome with constipation  Mildly exacerbated by abx treatment, some side effects of Augmentin discussed. Instructed about warning signs. F/U  in 6-12 months, before if needed.  Generalized anxiety disorder  Stable. No changes in current management, she is aware of medication side effects. Follow-up in 6 months.  Fibromyalgia  Continue low impact exercise. Fall precautions discussed. She has prescription for oxycodone, which she has not needed in a few months but could take if needed for severe pain. She understands side effects and risk of interaction with some of  her other medications. Flexeril to continue once daily as needed.   -She has mammogram scheduled for 06/2016. -Planning on Flu vaccine at [facility] next month. -Medicare preventive visit will be arranged.    -Ms. Chasady Longwell Bautch was advised to return sooner than planned today if new concerns arise.       Betty G. [place], MD  [facility]. [place] [facility].

## 2016-05-19 NOTE — Progress Notes (Signed)
Pre visit review using our clinic review tool, if applicable. No additional management support is needed unless otherwise documented below in the visit note. 

## 2016-05-20 ENCOUNTER — Encounter: Payer: Self-pay | Admitting: Family Medicine

## 2016-06-02 ENCOUNTER — Ambulatory Visit (INDEPENDENT_AMBULATORY_CARE_PROVIDER_SITE_OTHER): Payer: Medicare Other

## 2016-06-02 ENCOUNTER — Other Ambulatory Visit: Payer: Self-pay | Admitting: Family Medicine

## 2016-06-02 VITALS — BP 160/80 | HR 96 | Ht 67.5 in | Wt 166.2 lb

## 2016-06-02 DIAGNOSIS — I10 Essential (primary) hypertension: Secondary | ICD-10-CM

## 2016-06-02 DIAGNOSIS — Z0000 Encounter for general adult medical examination without abnormal findings: Secondary | ICD-10-CM

## 2016-06-02 DIAGNOSIS — Z7289 Other problems related to lifestyle: Secondary | ICD-10-CM | POA: Diagnosis not present

## 2016-06-02 NOTE — Progress Notes (Signed)
Subjective:   Megan Glover is a 70 y.o. female who presents for Medicare Annual (Subsequent) preventive examination.  HRA assessment completed during this visit with Megan Glover  The Patient was informed that the wellness visit is to identify future health risk and educate and initiate measures that can reduce risk for increased disease through the lifespan.    Psychosocial: Mother had breast cancer ; Father had DM, HTN;  2 sisters with breast cancer    NO ROS; Medicare Wellness Visit  OV 05/19/2016 HTN;160 80 vid D deficiency- taking 2000 u (level 38) IBS C; controlled with diet  Generalized OA; uses cane EKG; 02/2014   Describes health as good, fair or great? ok Feels she stomach bug today;  Bowels have been moving all day and some nausea  Son has the same c/o; Wanted to stay for AWV   Tobacco Hx Positive; quit 1981; 71 yo   ETOH:  drink 4 oz of red wine;  Due to plaque in arteries; can't take statin   Labs checked for lipid management and A1c or fasting BG; glucose <99   Diet; get up in am and is not hungry Lunch- cereal  Supper has dinner; vegetables; not much fruit  Tries to watch fat and portions size   Exercise Had 3 boys and was very active in her younger days Use treadmill; 20 minutes per day; 3 days a week;  Has stopped lately due to pain in right hip; ? Secondary to injury;  Femur fx; titanium rod x 2 years ago; was helping son moved and was fell over the hitch of his truck.  some days bother her more  Sometimes house keeping bothers hip Was exercising 5 days  Discussed breaking up walking in am and pm;  [place] and does not want to work when no one is home during the day  Counseling:   Health Maintenance Due  Topic Date Due  . Hepatitis C Screening  March 24, 1946  . ZOSTAVAX  05/06/2005  . DEXA SCAN  05/06/2010  . INFLUENZA VACCINE  03/15/2016   Medicare now request all "baby boomers" test for possible exposure to Hepatitis  C. Declined at present   Will order next screen check hep c once educated.  Many may have been exposed due to dental work, tatoo's, vaccinations when young. The Hepatitis C virus is dormant for many years and then sometimes will cause liver cancer.  Tdap; was given April 2008/ was repeated in 2015;    Educated to check with insurance regarding coverage of Shingles but states she has taken the shingles vaccine x 70 yo  Flu vaccine due / states she has to go to [facility] for that;  Keep in mind the flu shot is an inactivated vaccine and takes at least 2 weeks to build immunity. The flu virus can be dormant for 4 days prior to symptoms. Taking the flu shot at the beginning of the season can reduce the risk for the entire community.   [facility] I will call them to get report;  Mamogram due (04/2015) scheduled for Nov 2nd; in [place] will postpone in epic  Has taken calcium and Vt d (D3 2000)  Pap - no waived    Eye Exam- had eye exam in august of this year Dr. Ronney Asters;  70 yo cataract surgery Dr. Darel Hong both eye Lasik surgery; did well      Colonoscopy 09/2011 repeat in 5 years 09/2016  SAFETY: One level;   Falls Hx: no   Home Safety reviewed including smoke alarms; community safe; firearm safety if these exist; sunscreen; Hx of MVA in the last year.no  Both her sons are having health issues Worries about children; both lost job;   One has Mast cell syndrome and angioedema/  The other has Lupus; Her son has blood clots and finally dx;  Lupus anti-coagulant  Neither has health one finally rec'd medicaid but can't get disability.   Discussed rare disease fund;  NIH; clinical trails  Will outreach SW for any assistance as both children are in the area   Helping them as best she can   Cardiac Risk Factors include: advanced age (>49men, >53 women)     Objective:     Vitals: BP (!) 160/80   Pulse 96   Ht 5' 7.5" (1.715 m)   Wt 166 lb 4 oz (75.4  kg)   SpO2 (!) 56%   BMI 25.65 kg/m   Body mass index is 25.65 kg/m.   Tobacco History  Smoking Status  . Former Smoker  . Packs/day: 1.50  . Years: 25.00  . Types: Cigarettes  . Quit date: 05/09/1980  Smokeless Tobacco  . Never Used     Counseling given: Not Answered   Past Medical History:  Diagnosis Date  . Anemia   . Anxiety   . Cataract    bilateral -left > right  . Clostridium difficile diarrhea    05-09-14 denies any problems at this time  . Depression   . Fibromyalgia   . History of diverticulosis   . Hypertension   . Hypothyroidism   . IBS (irritable bowel syndrome)   . Neuromuscular disorder (HCC)    fibromyalgia  . Thyroid disease    Past Surgical History:  Procedure Laterality Date  . Cataract removal 2015 Bilateral   . DILATION AND CURETTAGE OF UTERUS    . INGUINAL HERNIA REPAIR Bilateral 05/16/2014   Procedure: LAPAROSCOPIC EXPLORATION AND REPAIR OF BILATERAL FEMEROL AND BILATERAL INGUINAL HERNIAS  WITH MESH;  Surgeon: Karie Soda, MD;  Location: [facility];  Service: General;  Laterality: Bilateral;  . INTRAMEDULLARY (IM) NAIL INTERTROCHANTERIC Right 02/21/2014   Procedure: INTRAMEDULLARY (IM) NAIL INTERTROCHANTRIC;  Surgeon: Eulas Post, MD;  Location: [facility];  Service: Orthopedics;  Laterality: Right;   Family History  Problem Relation Age of Onset  . Breast cancer Mother     breast CA  . Diabetes Father   . Hypertension Father   . Colon cancer    . Breast cancer Sister   . Breast cancer Sister   . Cancer Sister     breast  . Cancer Sister     breast   History  Sexual Activity  . Sexual activity: Not Currently    Outpatient Encounter Prescriptions as of 06/02/2016  Medication Sig  . acetaminophen (TYLENOL) 500 MG tablet Take 2 tablets (1,000 mg total) by mouth every 8 (eight) hours as needed for moderate pain.  Marland Kitchen albuterol (PROVENTIL HFA;VENTOLIN HFA) 108 (90 BASE) MCG/ACT inhaler Inhale 2 puffs into the lungs every 8 (eight) hours as  needed for wheezing or shortness of breath.  Marland Kitchen amoxicillin (AMOXIL) 875 MG tablet Take 1 tablet by mouth 2 (two) times daily.  . B Complex-C (B-COMPLEX WITH VITAMIN C) tablet Take 1 tablet by mouth daily.  . Biotin (PA BIOTIN) 1000 MCG tablet Take 1,000 mcg by mouth daily.  . cholecalciferol (VITAMIN D) 1000 UNITS tablet Take 2,000 Units  by mouth daily.  . clonazePAM (KLONOPIN) 0.5 MG tablet Take one tablet by mouth twice daily at 10am and 10pm.  . cyclobenzaprine (FLEXERIL) 10 MG tablet Take 1 tablet (10 mg total) by mouth daily as needed for muscle spasms.  . hydrALAZINE (APRESOLINE) 10 MG tablet Take 1 tablet (10 mg total) by mouth 3 (three) times daily.  . hydrocortisone (ANUSOL-HC) 2.5 % rectal cream Place 1 application rectally 2 (two) times daily.  Marland Kitchen. levothyroxine (SYNTHROID) 50 MCG tablet Take 50 mcg by mouth daily before breakfast.  . losartan (COZAAR) 100 MG tablet Take 1 tablet (100 mg total) by mouth every morning.  . magnesium oxide (MAG-OX) 400 MG tablet Take 400 mg by mouth daily.  . Multiple Vitamins-Minerals (ZINC PO) Take 1 capsule by mouth daily.  . Nebivolol HCl (BYSTOLIC) 20 MG TABS Take 0.5 tablets (10 mg total) by mouth 2 (two) times daily.  . Omega-3 Fatty Acids (FISH OIL) 1000 MG CAPS Take 1 capsule by mouth daily.  Marland Kitchen. OVER THE COUNTER MEDICATION Apply 2 drops to eye at bedtime as needed (for itchy dry eyes.).  Marland Kitchen. oxyCODONE (OXY IR/ROXICODONE) 5 MG immediate release tablet Take 1-2 tablets (5-10 mg total) by mouth every 4 (four) hours as needed for moderate pain, severe pain or breakthrough pain ((for MODERATE breakthrough pain)).   No facility-administered encounter medications on file as of 06/02/2016.     Activities of Daily Living In your present state of health, do you have any difficulty performing the following activities: 06/02/2016  Hearing? N  Vision? N  Difficulty concentrating or making decisions? N  Walking or climbing stairs? N  Dressing or bathing? N    Doing errands, shopping? N  Preparing Food and eating ? N  Using the Toilet? N  In the past six months, have you accidently leaked urine? N  Do you have problems with loss of bowel control? N  Managing your Medications? N  Managing your Finances? N  Housekeeping or managing your Housekeeping? N  Some recent data might be hidden    Patient Care Team: Betty G Swazil[place], MD as PCP - General (Family Medicine) Betty G [place], MD as Referring Physician (Family Medicine)    Assessment:     Exercise Activities and Dietary recommendations Current Exercise Habits: Home exercise routine, Type of exercise: walking, Time (Minutes): 20, Frequency (Times/Week): 4, Weekly Exercise (Minutes/Week): 80, Intensity: Mild  Goals    . patient          Travel one more time; across the country        Fall Risk No flowsheet data found. Depression Screen PHQ 2/9 Scores 06/02/2016  PHQ - 2 Score 0     Cognitive Function MMSE - Mini Mental State Exam 06/02/2016  Not completed: (No Data)    Ad8 score is 0    Immunization History  Administered Date(s) Administered  . Pneumococcal Conjugate-13 10/22/2015  . Pneumococcal Polysaccharide-23 11/28/2006  . Tdap 11/28/2006  . Zoster 08/15/2013   Screening Tests Health Maintenance  Topic Date Due  . Hepatitis C Screening  1945-10-07  . ZOSTAVAX  05/06/2005  . DEXA SCAN  05/06/2010  . INFLUENZA VACCINE  03/15/2016  . MAMMOGRAM  07/14/2016 (Originally 05/15/2016)  . TETANUS/TDAP  12/13/2023 (Originally 05/06/1964)  . PNA vac Low Risk Adult (2 of 2 - PPSV23) 10/21/2016  . COLONOSCOPY  10/10/2021      Plan:   Darl PikesSusan will call and try to get Dexa report   Will take your flu  shot when feeling better but soon    Will order hep c for you to be drawn at next blood draw. During the course of the visit the patient was educated and counseled about the following appropriate screening and preventive services:   Will outreach Child psychotherapist for any  assistance from rare disease or ophran disease funds for sons. Medically expenses placing a burden on the patient financially and emotionally.   Vaccines to include Pneumoccal, Influenza, Hepatitis B, Td, Zostavax, HCV  Electrocardiogram  Cardiovascular Disease  Colorectal cancer screening  Bone density screening  Diabetes screening  Glaucoma screening  Mammography/PAP  Nutrition counseling   Patient Instructions (the written plan) was given to the patient.   Montine Circle, RN  06/02/2016

## 2016-06-02 NOTE — Patient Instructions (Addendum)
[name] , Thank you for taking time to come for your Medicare Wellness Visit. I appreciate your ongoing commitment to your health goals. Please review the following plan we discussed and let me know if I can assist you in the future.   Manuela Schwartz will call and try to get Dexa report   Will take your flu shot when feeling better but soon   Will order hep c for you to be drawn at next blood draw.   These are the goals we discussed: Goals    . patient          Travel one more time; across the country         This is a list of the screening recommended for you and due dates:  Health Maintenance  Topic Date Due  .  Hepatitis C: One time screening is recommended by Center for Disease Control  (CDC) for  adults born from [birth year] through 1966.   [birth year]  . Shingles Vaccine  05/06/2005  . DEXA scan (bone density measurement)  05/06/2010  . Flu Shot  03/15/2016  . Mammogram  07/14/2016*  . Tetanus Vaccine  12/13/2023*  . Pneumonia vaccines (2 of 2 - PPSV23) 10/21/2016  . Colon Cancer Screening  10/10/2021  *Topic was postponed. The date shown is not the original due date.           Fall Prevention in the Home  Falls can cause injuries. They can happen to people of all ages. There are many things you can do to make your home safe and to help prevent falls.  WHAT CAN I DO ON THE OUTSIDE OF MY HOME?  Regularly fix the edges of walkways and driveways and fix any cracks.  Remove anything that might make you trip as you walk through a door, such as a raised step or threshold.  Trim any bushes or trees on the path to your home.  Use bright outdoor lighting.  Clear any walking paths of anything that might make someone trip, such as rocks or tools.  Regularly check to see if handrails are loose or broken. Make sure that both sides of any steps have handrails.  Any raised decks and porches should have guardrails on the edges.  Have any leaves, snow, or ice cleared  regularly.  Use sand or salt on walking paths during winter.  Clean up any spills in your garage right away. This includes oil or grease spills. WHAT CAN I DO IN THE BATHROOM?   Use night lights.  Install grab bars by the toilet and in the tub and shower. Do not use towel bars as grab bars.  Use non-skid mats or decals in the tub or shower.  If you need to sit down in the shower, use a plastic, non-slip stool.  Keep the floor dry. Clean up any water that spills on the floor as soon as it happens.  Remove soap buildup in the tub or shower regularly.  Attach bath mats securely with double-sided non-slip rug tape.  Do not have throw rugs and other things on the floor that can make you trip. WHAT CAN I DO IN THE BEDROOM?  Use night lights.  Make sure that you have a light by your bed that is easy to reach.  Do not use any sheets or blankets that are too big for your bed. They should not hang down onto the floor.  Have a firm chair that has side arms. You can use  this for support while you get dressed.  Do not have throw rugs and other things on the floor that can make you trip. WHAT CAN I DO IN THE KITCHEN?  Clean up any spills right away.  Avoid walking on wet floors.  Keep items that you use a lot in easy-to-reach places.  If you need to reach something above you, use a strong step stool that has a grab bar.  Keep electrical cords out of the way.  Do not use floor polish or wax that makes floors slippery. If you must use wax, use non-skid floor wax.  Do not have throw rugs and other things on the floor that can make you trip. WHAT CAN I DO WITH MY STAIRS?  Do not leave any items on the stairs.  Make sure that there are handrails on both sides of the stairs and use them. Fix handrails that are broken or loose. Make sure that handrails are as long as the stairways.  Check any carpeting to make sure that it is firmly attached to the stairs. Fix any carpet that is  loose or worn.  Avoid having throw rugs at the top or bottom of the stairs. If you do have throw rugs, attach them to the floor with carpet tape.  Make sure that you have a light switch at the top of the stairs and the bottom of the stairs. If you do not have them, ask someone to add them for you. WHAT ELSE CAN I DO TO HELP PREVENT FALLS?  Wear shoes that:  Do not have high heels.  Have rubber bottoms.  Are comfortable and fit you well.  Are closed at the toe. Do not wear sandals.  If you use a stepladder:  Make sure that it is fully opened. Do not climb a closed stepladder.  Make sure that both sides of the stepladder are locked into place.  Ask someone to hold it for you, if possible.  Clearly mark and make sure that you can see:  Any grab bars or handrails.  First and last steps.  Where the edge of each step is.  Use tools that help you move around (mobility aids) if they are needed. These include:  Canes.  Walkers.  Scooters.  Crutches.  Turn on the lights when you go into a dark area. Replace any light bulbs as soon as they burn out.  Set up your furniture so you have a clear path. Avoid moving your furniture around.  If any of your floors are uneven, fix them.  If there are any pets around you, be aware of where they are.  Review your medicines with your doctor. Some medicines can make you feel dizzy. This can increase your chance of falling. Ask your doctor what other things that you can do to help prevent falls.   This information is not intended to replace advice given to you by your health care provider. Make sure you discuss any questions you have with your health care provider.   Document Released: 05/28/2009 Document Revised: 12/16/2014 Document Reviewed: 09/05/2014 Elsevier Interactive Patient Education 2016 [place] Maintenance, Female Adopting a healthy lifestyle and getting preventive care can go a long way to promote health  and wellness. Talk with your health care provider about what schedule of regular examinations is right for you. This is a good chance for you to check in with your provider about disease prevention and staying healthy. In between checkups, there are plenty  of things you can do on your own. Experts have done a lot of research about which lifestyle changes and preventive measures are most likely to keep you healthy. Ask your health care provider for more information. WEIGHT AND DIET  Eat a healthy diet  Be sure to include plenty of vegetables, fruits, low-fat dairy products, and lean protein.  Do not eat a lot of foods high in solid fats, added sugars, or salt.  Get regular exercise. This is one of the most important things you can do for your health.  Most adults should exercise for at least 150 minutes each week. The exercise should increase your heart rate and make you sweat (moderate-intensity exercise).  Most adults should also do strengthening exercises at least twice a week. This is in addition to the moderate-intensity exercise.  Maintain a healthy weight  Body mass index (BMI) is a measurement that can be used to identify possible weight problems. It estimates body fat based on height and weight. Your health care provider can help determine your BMI and help you achieve or maintain a healthy weight.  For females 69 years of age and older:   A BMI below 18.5 is considered underweight.  A BMI of 18.5 to 24.9 is normal.  A BMI of 25 to 29.9 is considered overweight.  A BMI of 30 and above is considered obese.  Watch levels of cholesterol and blood lipids  You should start having your blood tested for lipids and cholesterol at 71 years of age, then have this test every 5 years.  You may need to have your cholesterol levels checked more often if:  Your lipid or cholesterol levels are high.  You are older than 71 years of age.  You are at high risk for heart disease.   CANCER SCREENING   Lung Cancer  Lung cancer screening is recommended for adults 29-50 years old who are at high risk for lung cancer because of a history of smoking.  A yearly low-dose CT scan of the lungs is recommended for people who:  Currently smoke.  Have quit within the past 15 years.  Have at least a 30-pack-year history of smoking. A pack year is smoking an average of one pack of cigarettes a day for 1 year.  Yearly screening should continue until it has been 15 years since you quit.  Yearly screening should stop if you develop a health problem that would prevent you from having lung cancer treatment.  Breast Cancer  Practice breast self-awareness. This means understanding how your breasts normally appear and feel.  It also means doing regular breast self-exams. Let your health care provider know about any changes, no matter how small.  If you are in your 20s or 30s, you should have a clinical breast exam (CBE) by a health care provider every 1-3 years as part of a regular health exam.  If you are 89 or older, have a CBE every year. Also consider having a breast X-ray (mammogram) every year.  If you have a family history of breast cancer, talk to your health care provider about genetic screening.  If you are at high risk for breast cancer, talk to your health care provider about having an MRI and a mammogram every year.  Breast cancer gene (BRCA) assessment is recommended for women who have family members with BRCA-related cancers. BRCA-related cancers include:  Breast.  Ovarian.  Tubal.  Peritoneal cancers.  Results of the assessment will determine the need for genetic  counseling and BRCA1 and BRCA2 testing. Cervical Cancer Your health care provider may recommend that you be screened regularly for cancer of the pelvic organs (ovaries, uterus, and vagina). This screening involves a pelvic examination, including checking for microscopic changes to the surface of  your cervix (Pap test). You may be encouraged to have this screening done every 3 years, beginning at age 80.  For women ages 74-65, health care providers may recommend pelvic exams and Pap testing every 3 years, or they may recommend the Pap and pelvic exam, combined with testing for human papilloma virus (HPV), every 5 years. Some types of HPV increase your risk of cervical cancer. Testing for HPV may also be done on women of any age with unclear Pap test results.  Other health care providers may not recommend any screening for nonpregnant women who are considered low risk for pelvic cancer and who do not have symptoms. Ask your health care provider if a screening pelvic exam is right for you.  If you have had past treatment for cervical cancer or a condition that could lead to cancer, you need Pap tests and screening for cancer for at least 20 years after your treatment. If Pap tests have been discontinued, your risk factors (such as having a new sexual partner) need to be reassessed to determine if screening should resume. Some women have medical problems that increase the chance of getting cervical cancer. In these cases, your health care provider may recommend more frequent screening and Pap tests. Colorectal Cancer  This type of cancer can be detected and often prevented.  Routine colorectal cancer screening usually begins at 71 years of age and continues through 71 years of age.  Your health care provider may recommend screening at an earlier age if you have risk factors for colon cancer.  Your health care provider may also recommend using home test kits to check for hidden blood in the stool.  A small camera at the end of a tube can be used to examine your colon directly (sigmoidoscopy or colonoscopy). This is done to check for the earliest forms of colorectal cancer.  Routine screening usually begins at age 3.  Direct examination of the colon should be repeated every 5-10 years  through 71 years of age. However, you may need to be screened more often if early forms of precancerous polyps or small growths are found. Skin Cancer  Check your skin from head to toe regularly.  Tell your health care provider about any new moles or changes in moles, especially if there is a change in a mole's shape or color.  Also tell your health care provider if you have a mole that is larger than the size of a pencil eraser.  Always use sunscreen. Apply sunscreen liberally and repeatedly throughout the day.  Protect yourself by wearing long sleeves, pants, a wide-brimmed hat, and sunglasses whenever you are outside. HEART DISEASE, DIABETES, AND HIGH BLOOD PRESSURE   High blood pressure causes heart disease and increases the risk of stroke. High blood pressure is more likely to develop in:  People who have blood pressure in the high end of the normal range (130-139/85-89 mm Hg).  People who are overweight or obese.  People who are African American.  If you are 63-71 years of age, have your blood pressure checked every 3-5 years. If you are 41 years of age or older, have your blood pressure checked every year. You should have your blood pressure measured twice--once when you  are at a hospital or clinic, and once when you are not at a hospital or clinic. Record the average of the two measurements. To check your blood pressure when you are not at a hospital or clinic, you can use:  An automated blood pressure machine at a pharmacy.  A home blood pressure monitor.  If you are between 28 years and 67 years old, ask your health care provider if you should take aspirin to prevent strokes.  Have regular diabetes screenings. This involves taking a blood sample to check your fasting blood sugar level.  If you are at a normal weight and have a low risk for diabetes, have this test once every three years after 71 years of age.  If you are overweight and have a high risk for diabetes,  consider being tested at a younger age or more often. PREVENTING INFECTION  Hepatitis B  If you have a higher risk for hepatitis B, you should be screened for this virus. You are considered at high risk for hepatitis B if:  You were born in a country where hepatitis B is common. Ask your health care provider which countries are considered high risk.  Your parents were born in a high-risk country, and you have not been immunized against hepatitis B (hepatitis B vaccine).  You have HIV or AIDS.  You use needles to inject street drugs.  You live with someone who has hepatitis B.  You have had sex with someone who has hepatitis B.  You get hemodialysis treatment.  You take certain medicines for conditions, including cancer, organ transplantation, and autoimmune conditions. Hepatitis C  Blood testing is recommended for:  Everyone born from [birth year] through 1966.  Anyone with known risk factors for hepatitis C. Sexually transmitted infections (STIs)  You should be screened for sexually transmitted infections (STIs) including gonorrhea and chlamydia if:  You are sexually active and are younger than 71 years of age.  You are older than 71 years of age and your health care provider tells you that you are at risk for this type of infection.  Your sexual activity has changed since you were last screened and you are at an increased risk for chlamydia or gonorrhea. Ask your health care provider if you are at risk.  If you do not have HIV, but are at risk, it may be recommended that you take a prescription medicine daily to prevent HIV infection. This is called pre-exposure prophylaxis (PrEP). You are considered at risk if:  You are sexually active and do not regularly use condoms or know the HIV status of your partner(s).  You take drugs by injection.  You are sexually active with a partner who has HIV. Talk with your health care provider about whether you are at high risk of being  infected with HIV. If you choose to begin PrEP, you should first be tested for HIV. You should then be tested every 3 months for as long as you are taking PrEP.  PREGNANCY   If you are premenopausal and you may become pregnant, ask your health care provider about preconception counseling.  If you may become pregnant, take 400 to 800 micrograms (mcg) of folic acid every day.  If you want to prevent pregnancy, talk to your health care provider about birth control (contraception). OSTEOPOROSIS AND MENOPAUSE   Osteoporosis is a disease in which the bones lose minerals and strength with aging. This can result in serious bone fractures. Your risk for osteoporosis can be  identified using a bone density scan.  If you are 81 years of age or older, or if you are at risk for osteoporosis and fractures, ask your health care provider if you should be screened.  Ask your health care provider whether you should take a calcium or vitamin D supplement to lower your risk for osteoporosis.  Menopause may have certain physical symptoms and risks.  Hormone replacement therapy may reduce some of these symptoms and risks. Talk to your health care provider about whether hormone replacement therapy is right for you.  HOME CARE INSTRUCTIONS   Schedule regular health, dental, and eye exams.  Stay current with your immunizations.   Do not use any tobacco products including cigarettes, chewing tobacco, or electronic cigarettes.  If you are pregnant, do not drink alcohol.  If you are breastfeeding, limit how much and how often you drink alcohol.  Limit alcohol intake to no more than 1 drink per day for nonpregnant women. One drink equals 12 ounces of beer, 5 ounces of wine, or 1 ounces of hard liquor.  Do not use street drugs.  Do not share needles.  Ask your health care provider for help if you need support or information about quitting drugs.  Tell your health care provider if you often feel  depressed.  Tell your health care provider if you have ever been abused or do not feel safe at home.   This information is not intended to replace advice given to you by your health care provider. Make sure you discuss any questions you have with your health care provider.   Document Released: 02/14/2011 Document Revised: 08/22/2014 Document Reviewed: 07/03/2013 Elsevier Interactive Patient Education [employer].

## 2016-06-06 NOTE — Progress Notes (Signed)
I have reviewed documentation from this visit and I agree with recommendations given.  Betty G. Jordan, MD  [facility]. [place] office.   

## 2016-06-08 ENCOUNTER — Telehealth: Payer: Self-pay

## 2016-06-08 NOTE — Telephone Encounter (Signed)
[facility], as there are several in [place]. States she went to [place] office; number; [phone]  [facility] to fup on Dexa report following AWV on 10/19  Was told Megan Glover had a dexa in 2012  Notes state the doctor recommended  medication for osteoporosis. Does not have t score.  Will fup with Megan Glover for plan; Apt is in April, 2018   Please advise if you would like Megan Glover to fup or have a repeat dexa?  Tks,  sh

## 2016-06-13 NOTE — Telephone Encounter (Signed)
I do not think she is willing to try medication, in which case it is not necessary. She has Hx of side effects to many medications. I believed I have addressed it before, when I did her preventive Medicare last year. You can ask her is she will be willing to try medication if needed according to report, then document on health maintenance.   Thanks, BJ

## 2016-06-15 NOTE — Telephone Encounter (Signed)
Call to Ms. Allston regarding DEXA Ms. Severtson states she has her report and will locate it and bring it in for Dr. Swaz[place]Jordan to review.  Will discuss with her the Tx once she has the results.  sh

## 2016-06-21 ENCOUNTER — Encounter: Payer: Self-pay | Admitting: Family Medicine

## 2016-06-22 ENCOUNTER — Other Ambulatory Visit: Payer: Self-pay

## 2016-06-22 MED ORDER — LEVOTHYROXINE SODIUM 50 MCG PO TABS: 50.0000 ug | ORAL_TABLET | Freq: Every day | ORAL | 2 refills | Status: DC

## 2016-06-27 ENCOUNTER — Encounter: Payer: Self-pay | Admitting: Family Medicine

## 2016-07-07 ENCOUNTER — Telehealth: Payer: Self-pay | Admitting: Family Medicine

## 2016-07-07 DIAGNOSIS — F419 Anxiety disorder, unspecified: Secondary | ICD-10-CM

## 2016-07-10 ENCOUNTER — Encounter: Payer: Self-pay | Admitting: Family Medicine

## 2016-07-10 ENCOUNTER — Other Ambulatory Visit (HOSPITAL_COMMUNITY): Payer: Self-pay | Admitting: Internal Medicine

## 2016-07-12 NOTE — Telephone Encounter (Signed)
Clonazepam 0.5 mg can be called in to continue 1 tab bid as needed. # 60/3. Thanks, BJ

## 2016-07-12 NOTE — Telephone Encounter (Signed)
Rx called in 

## 2016-07-12 NOTE — Telephone Encounter (Signed)
Okay to refill? 

## 2016-07-12 NOTE — Telephone Encounter (Signed)
Pt following up on request refill  clonazePAM (KLONOPIN) 0.5 MG tablet  Pt has 2 tabs left. Also sent a mychart message.  [facility]

## 2016-07-12 NOTE — Telephone Encounter (Signed)
See below

## 2016-07-13 NOTE — Telephone Encounter (Signed)
[facility]. Please take the request and advise the patient. Thank you.

## 2016-07-15 ENCOUNTER — Ambulatory Visit: Payer: Medicare Other

## 2016-07-16 ENCOUNTER — Other Ambulatory Visit: Payer: Self-pay | Admitting: Family Medicine

## 2016-07-16 DIAGNOSIS — M797 Fibromyalgia: Secondary | ICD-10-CM

## 2016-07-16 MED ORDER — CYCLOBENZAPRINE HCL 10 MG PO TABS: 5.0000 mg | ORAL_TABLET | Freq: Every day | ORAL | 0 refills | Status: DC | PRN

## 2016-07-16 MED ORDER — ACETAMINOPHEN 500 MG PO TABS: 500.0000 mg | ORAL_TABLET | Freq: Three times a day (TID) | ORAL | 3 refills | Status: DC | PRN

## 2016-07-17 ENCOUNTER — Telehealth: Payer: Self-pay | Admitting: Family Medicine

## 2016-07-17 ENCOUNTER — Other Ambulatory Visit: Payer: Self-pay | Admitting: Family Medicine

## 2016-07-17 ENCOUNTER — Encounter: Payer: Self-pay | Admitting: Family Medicine

## 2016-07-17 DIAGNOSIS — M797 Fibromyalgia: Secondary | ICD-10-CM

## 2016-07-18 ENCOUNTER — Other Ambulatory Visit: Payer: Self-pay

## 2016-07-18 ENCOUNTER — Other Ambulatory Visit: Payer: Self-pay | Admitting: Family Medicine

## 2016-07-18 DIAGNOSIS — M797 Fibromyalgia: Secondary | ICD-10-CM

## 2016-07-18 MED ORDER — CYCLOBENZAPRINE HCL 10 MG PO TABS: 5.0000 mg | ORAL_TABLET | Freq: Every day | ORAL | 0 refills | Status: DC | PRN

## 2016-07-18 NOTE — Telephone Encounter (Signed)
Rx has already been sent to [facility] per patient request.  

## 2016-07-18 NOTE — Telephone Encounter (Signed)
[facility].

## 2016-09-26 ENCOUNTER — Encounter: Payer: Self-pay | Admitting: Family Medicine

## 2016-09-27 ENCOUNTER — Telehealth: Payer: Self-pay

## 2016-09-27 ENCOUNTER — Encounter: Payer: Self-pay | Admitting: Family Medicine

## 2016-09-27 ENCOUNTER — Other Ambulatory Visit: Payer: Self-pay

## 2016-09-27 DIAGNOSIS — K644 Residual hemorrhoidal skin tags: Secondary | ICD-10-CM

## 2016-09-27 MED ORDER — HYDROCORTISONE 2.5 % RE CREA: 1.0000 "application " | TOPICAL_CREAM | Freq: Two times a day (BID) | RECTAL | 1 refills | Status: DC

## 2016-09-27 NOTE — Telephone Encounter (Signed)
Received PA request from [facility] for Hydrocortisone 2.5% cream. PA submitted & is pending. Key: [id number]

## 2016-09-28 NOTE — Telephone Encounter (Signed)
Received fax from insurance that PA is not needed, form faxed to pharmacy.

## 2016-10-11 ENCOUNTER — Encounter: Payer: Self-pay | Admitting: Family Medicine

## 2016-10-11 ENCOUNTER — Other Ambulatory Visit: Payer: Self-pay | Admitting: Family Medicine

## 2016-10-11 DIAGNOSIS — Z1211 Encounter for screening for malignant neoplasm of colon: Secondary | ICD-10-CM

## 2016-10-26 ENCOUNTER — Telehealth: Payer: Self-pay

## 2016-10-26 DIAGNOSIS — F419 Anxiety disorder, unspecified: Secondary | ICD-10-CM

## 2016-10-26 NOTE — Telephone Encounter (Signed)
Refill request for Clonazepam 0.5 mg tablets. #60  Okay to refill?

## 2016-10-27 MED ORDER — CLONAZEPAM 0.5 MG PO TABS: ORAL_TABLET | ORAL | 3 refills | Status: DC

## 2016-10-27 NOTE — Telephone Encounter (Signed)
Rx called into pharmacy's voicemail & fill date of 10/30/16 left on voicemail.

## 2016-10-27 NOTE — Addendum Note (Signed)
Addended by: Marcell AngerSELF, SARAH E on: 10/27/2016 02:49 PM   Modules accepted: Orders

## 2016-10-27 NOTE — Telephone Encounter (Signed)
It seems like Rx for Clonazepam was last filled on 10/01/16, [but prior Rx's were filled earlier: 08/29/16,08/04/16, and11/28/17]. Rx for Clonazepam 0.5 mg to continue bid can be called in to be fill 10/30/16, Rx to be fill q 30 days. # 60/3. Thanks

## 2016-11-01 ENCOUNTER — Other Ambulatory Visit: Payer: Self-pay | Admitting: Family Medicine

## 2016-11-01 DIAGNOSIS — M797 Fibromyalgia: Secondary | ICD-10-CM

## 2016-11-02 MED ORDER — CYCLOBENZAPRINE HCL 10 MG PO TABS: 5.0000 mg | ORAL_TABLET | Freq: Every day | ORAL | 1 refills | Status: DC | PRN

## 2016-11-16 DIAGNOSIS — J449 Chronic obstructive pulmonary disease, unspecified: Secondary | ICD-10-CM | POA: Insufficient documentation

## 2016-11-16 NOTE — Progress Notes (Signed)
HPI:   Megan Glover is a 71 y.o. female, who is here today to follow on some chronic medical problems.  Last OV on 05/19/16. She had colonoscopy 11/15/16 3 polyps and 5 years follow up recommend.  Anxiety: She is on Clonazepam 0.5 mg bid as needed.She has not tolerated SSRI's or SRRI. She denies depressed mood or suicidal thoughts. She has been under some stress lately due to son's illness, he is having lab results today.  Hypertension:   Currently on Losartan 100 mg, Bystolic 20 mg 1/2 tab bid, and Hydralazine 10 mg tid. She has had poor tolerance to most of antihypertensive medications and the only ones she has tolerated well as the ones she is currently taking.  She doe snot check BP's at home because exacerbates anxiety.  She has not noted unusual headache, visual changes, exertional chest pain, dyspnea,  focal weakness, or edema.   Lab Results  Component Value Date   CREATININE 0.72 05/19/2016   BUN 14 05/19/2016   NA 136 05/19/2016   K 4.6 05/19/2016   CL 101 05/19/2016   CO2 27 05/19/2016   Hypothyroidism:  Currently she is on Levothyroxine 50 mcg daily. Tolerating medication well, no side effects reported. She has not noted dysphagia, palpitations, changes in bowel habits, tremor, cold/heat intolerance, or abnormal weight loss.  Lab Results  Component Value Date   TSH 1.95 11/23/2015   + Fatigue,no more than usual,Hx of fibromyalgia.She is on Flexeril 5-10 mg daily as needed. Pain is stable, exacerbated by prolonged standing/waking. Alleviated by rest. She usually tries to avoid activities that exacerbate pain.   -A few weeks ago she started having RLQ pain and noted intermittent bulging area where hernia was repair.It has been better for the past few days. Pain is intermittently, achy and throbbing 5-8/10, and no radiated. Exacerbated by walking the treadmill and some activities. Alleviated by rest.  Taking Tylenol for pain. 05/16/16  hernia repair. Concerned about mesh she had placed, she states that has heard about some lawsuits related to Mesh used for hernia repair.  Hx of IBS-C  Denies associated nausea, vomiting, changes in bowel habits, blood in stool or melena. Denies urinary symptoms.   Vaginal burning daily, 6-7 months. Reviewing records she was seen on 05/28/2014 by gyn,Dr Altamese [place], Dx with atrophic vagina.She is not interested in vaginal estrogen treatment,afraid of side effects.She has 2 sisters with breast cancer. She has not noted vaginal discharge or bleeding. She has tried OTC [place]. She is afraid of this being related to hernia repair.   Osteoporosis: Fosamax caused GI irritation and took it for 4 months. She could not afford other treatment options. She is not interested in further follow up or treatment.   COPD:  Albuterol inh as needed for wheezing. Last time she had exacerbation was about 4 weeks ago. She uses Albuterol inh as needed.    Review of Systems  Constitutional: Positive for fatigue (no more than usual). Negative for appetite change, chills and fever.  HENT: Negative for mouth sores, nosebleeds and trouble swallowing.   Eyes: Negative for pain and visual disturbance.  Respiratory: Negative for cough and shortness of breath.   Cardiovascular: Negative for chest pain, palpitations and leg swelling.  Gastrointestinal: Positive for abdominal pain. Negative for blood in stool, nausea and vomiting.       Negative for changes in bowel habits.  Endocrine: Negative for cold intolerance and heat intolerance.  Genitourinary: Negative for decreased urine volume, difficulty  urinating, dysuria, hematuria, vaginal bleeding and vaginal discharge.  Musculoskeletal: Positive for back pain and myalgias.  Skin: Negative for color change and rash.  Allergic/Immunologic: Positive for environmental allergies.  Neurological: Negative for syncope, weakness and headaches.  Psychiatric/Behavioral: Negative  for confusion and suicidal ideas. The patient is nervous/anxious.       Current Outpatient Prescriptions on File Prior to Visit  Medication Sig Dispense Refill  . albuterol (PROVENTIL HFA;VENTOLIN HFA) 108 (90 BASE) MCG/ACT inhaler Inhale 2 puffs into the lungs every 8 (eight) hours as needed for wheezing or shortness of breath.    . B Complex-C (B-COMPLEX WITH VITAMIN C) tablet Take 1 tablet by mouth daily.    . Biotin (PA BIOTIN) 1000 MCG tablet Take 1,000 mcg by mouth daily.    . cholecalciferol (VITAMIN D) 1000 UNITS tablet Take 2,000 Units by mouth daily.    . clonazePAM (KLONOPIN) 0.5 MG tablet TAKE 1 TABLET BY MOUTH TWICE DAILY AT 10:00 AM AND 10:00 PM 60 tablet 3  . cyclobenzaprine (FLEXERIL) 10 MG tablet Take 0.5-1 tablets (5-10 mg total) by mouth daily as needed for muscle spasms. 90 tablet 1  . hydrocortisone (ANUSOL-HC) 2.5 % rectal cream Place 1 application rectally 2 (two) times daily. 30 g 1  . levothyroxine (SYNTHROID) 50 MCG tablet Take 1 tablet (50 mcg total) by mouth daily before breakfast. 90 tablet 2  . losartan (COZAAR) 100 MG tablet TAKE 1 TABLET(100 MG) BY MOUTH EVERY MORNING 90 tablet 1  . Multiple Vitamins-Minerals (ZINC PO) Take 1 capsule by mouth daily.    . Omega-3 Fatty Acids (FISH OIL) 1000 MG CAPS Take 1 capsule by mouth daily.    Marland Kitchen OVER THE COUNTER MEDICATION Apply 2 drops to eye at bedtime as needed (for itchy dry eyes.).    Marland Kitchen oxyCODONE (OXY IR/ROXICODONE) 5 MG immediate release tablet Take 1-2 tablets (5-10 mg total) by mouth every 4 (four) hours as needed for moderate pain, severe pain or breakthrough pain ((for MODERATE breakthrough pain)). 50 tablet 0   No current facility-administered medications on file prior to visit.      Past Medical History:  Diagnosis Date  . Anemia   . Anxiety   . Cataract    bilateral -left > right  . Clostridium difficile diarrhea    05-09-14 denies any problems at this time  . Depression   . Fibromyalgia   . History  of diverticulosis   . Hypertension   . Hypothyroidism   . IBS (irritable bowel syndrome)   . Neuromuscular disorder (HCC)    fibromyalgia  . Thyroid disease    Allergies  Allergen Reactions  . Clindamycin/Lincomycin     CDIF  . Demerol [Meperidine] Nausea And Vomiting, Other (See Comments) and Hypertension    Severe headache  . Erythromycin Diarrhea  . Hydrochlorothiazide Other (See Comments)    Weakness/malaise  . Levaquin [Levofloxacin]     Yeast infection  . Spironolactone Other (See Comments)    Weakness/malaise  . Sulfa Antibiotics Other (See Comments)    Anal itching    Social History   Social History  . Marital status: Married    Spouse name: Briceida Rasberry  . Number of children: N/A  . Years of education: N/A   Social History Main Topics  . Smoking status: Former Smoker    Packs/day: 1.50    Years: 25.00    Types: Cigarettes    Quit date: 05/09/1980  . Smokeless tobacco: Never Used  . Alcohol use No  .  Drug use: No  . Sexual activity: Not Currently   Other Topics Concern  . None   Social History Narrative  . None    Vitals:   11/17/16 1436  BP: 138/90  Pulse: 67  Resp: 12  O2 sat 96% at RA. Body mass index is 25.36 kg/m.   Physical Exam  Nursing note and vitals reviewed. Constitutional: She is oriented to person, place, and time. She appears well-developed and well-nourished. No distress.  HENT:  Head: Atraumatic.  Mouth/Throat: Oropharynx is clear and moist. Mucous membranes are dry.  Eyes: Conjunctivae and EOM are normal. Pupils are equal, round, and reactive to light.  Neck: No tracheal deviation present. No thyroid mass and no thyromegaly present.  Cardiovascular: Normal rate and regular rhythm.   No murmur heard. Pulses:      Dorsalis pedis pulses are 2+ on the right side, and 2+ on the left side.  Respiratory: Effort normal and breath sounds normal. No respiratory distress.  GI: Soft. Bowel sounds are normal. There is no  hepatomegaly. There is tenderness in the right lower quadrant. There is no rigidity, no rebound and no guarding.    RLQ pain upon palpation. There is a mass palpable, about 4 cm,mildly tender,not reducible and no major changes upon valsalva. Lesion is not palpable on supine position, ? Hernia defect but no bulged area or protrusion upon valsalva while on exam table.  Musculoskeletal: She exhibits no edema.       Thoracic back: She exhibits no tenderness and no bony tenderness.       Lumbar back: She exhibits no tenderness and no bony tenderness.  + Trigger points upper cervical and trapezium bilateral.  Lymphadenopathy:    She has no cervical adenopathy.  Neurological: She is alert and oriented to person, place, and time. She has normal strength. Coordination normal.  Stable gait with no assistance.  Skin: Skin is warm. No erythema.  Psychiatric: Her mood appears anxious.  Well groomed, good eye contact.    ASSESSMENT AND PLAN:   Allesha was seen today for follow-up.  Diagnoses and all orders for this visit:  RLQ abdominal pain  We discussed possible causes. It could be recurrence of hernia or scarring changes or reaction to mesh. She is not interested in surgery referral at this time,I do not think imaging is needed now. Clearly instructed about warning signs. She would let me know about surgery referral.   Essential hypertension  Overall adequately controlled. No changes in current management. DASH-low salt diet recommended. Eye exam is current. F/U in 4 months, before if needed.  -     Comprehensive metabolic panel -     Nebivolol HCl (BYSTOLIC) 20 MG TABS; Take 0.5 tablets (10 mg total) by mouth 2 (two) times daily. -     hydrALAZINE (APRESOLINE) 10 MG tablet; Take 1 tablet (10 mg total) by mouth 3 (three) times daily.  Other specified hypothyroidism  No changes in current management, will follow labs done today and will give further recommendations  accordingly. F/U in 12 months.  -     TSH -     T4, free  Chronic obstructive pulmonary disease, unspecified COPD type (HCC)  Stable. No changes in current management. F/U in 12 months.  Generalized anxiety disorder  Stable otherwise. She has not tolerated other treatments. No changes in current management. F/U in 4 months.  Atrophic vaginitis  Today I could not do a pelvic examination. She is not interested in Premarin topical. We  planned on pelvic exam next OV if still symptomatic. Educated about warning signs.  Fibromyalgia  Stable. Continue Flexeril as needed. Some side effects of medication discussed,still helping.  Osteoporosis without current pathological fracture, unspecified osteoporosis type  She is not interested in further treatment or testing. Fall precautions discussed. Ca++ and vit D supplement recommended.     Visit from 2:51 pm to 3:36 pm, > 50% of visit was dedicated to discussing her new concerns, prognosis,and treatment options as well as plan of care. We reviewed surgical report, finding during visit today, possible complications if is fact hernia has re-occurred.    -Ms. Tiaira Arambula Lachney was advised to return sooner than planned today if new concerns arise.       Harace Mccluney G. [place], MD  [facility]. [place] office.

## 2016-11-17 ENCOUNTER — Encounter: Payer: Self-pay | Admitting: Family Medicine

## 2016-11-17 ENCOUNTER — Ambulatory Visit (INDEPENDENT_AMBULATORY_CARE_PROVIDER_SITE_OTHER): Payer: Medicare Other | Admitting: Family Medicine

## 2016-11-17 VITALS — BP 138/90 | HR 67 | Resp 12 | Ht 67.5 in | Wt 164.4 lb

## 2016-11-17 DIAGNOSIS — E038 Other specified hypothyroidism: Secondary | ICD-10-CM | POA: Diagnosis not present

## 2016-11-17 DIAGNOSIS — M810 Age-related osteoporosis without current pathological fracture: Secondary | ICD-10-CM | POA: Insufficient documentation

## 2016-11-17 DIAGNOSIS — M797 Fibromyalgia: Secondary | ICD-10-CM | POA: Diagnosis not present

## 2016-11-17 DIAGNOSIS — J449 Chronic obstructive pulmonary disease, unspecified: Secondary | ICD-10-CM

## 2016-11-17 DIAGNOSIS — N952 Postmenopausal atrophic vaginitis: Secondary | ICD-10-CM | POA: Insufficient documentation

## 2016-11-17 DIAGNOSIS — F411 Generalized anxiety disorder: Secondary | ICD-10-CM

## 2016-11-17 DIAGNOSIS — I10 Essential (primary) hypertension: Secondary | ICD-10-CM | POA: Diagnosis not present

## 2016-11-17 DIAGNOSIS — R1031 Right lower quadrant pain: Secondary | ICD-10-CM

## 2016-11-17 NOTE — Progress Notes (Signed)
Pre visit review using our clinic review tool, if applicable. No additional management support is needed unless otherwise documented below in the visit note. 

## 2016-11-17 NOTE — Patient Instructions (Addendum)
A few things to remember from today's visit:   Essential hypertension - Plan: Comprehensive metabolic panel  Generalized anxiety disorder  Other specified hypothyroidism - Plan: TSH, T4, free  Chronic obstructive pulmonary disease, unspecified COPD type (HCC)  RLQ abdominal pain Let me know when you decide about surgery evaluation.  No changes today.   Please be sure medication list is accurate. If a new problem present, please set up appointment sooner than planned today.

## 2016-11-18 ENCOUNTER — Encounter: Payer: Self-pay | Admitting: Family Medicine

## 2016-11-18 LAB — COMPREHENSIVE METABOLIC PANEL
ALT: 19 U/L (ref 0–35)
AST: 27 U/L (ref 0–37)
Albumin: 4.6 g/dL (ref 3.5–5.2)
Alkaline Phosphatase: 49 U/L (ref 39–117)
BUN: 15 mg/dL (ref 6–23)
CHLORIDE: 101 meq/L (ref 96–112)
CO2: 29 mEq/L (ref 19–32)
Calcium: 9.9 mg/dL (ref 8.4–10.5)
Creatinine, Ser: 0.85 mg/dL (ref 0.40–1.20)
GFR: 69.97 mL/min (ref >60.00)
GLUCOSE: 97 mg/dL (ref 70–99)
Potassium: 4.8 mEq/L (ref 3.5–5.1)
SODIUM: 137 meq/L (ref 135–145)
Total Bilirubin: 0.7 mg/dL (ref 0.2–1.2)
Total Protein: 7.2 g/dL (ref 6.0–8.3)

## 2016-11-18 LAB — T4, FREE: FREE T4: 1.41 ng/dL (ref 0.60–1.60)

## 2016-11-18 LAB — TSH: TSH: 1.23 u[IU]/mL (ref 0.35–4.50)

## 2016-11-20 MED ORDER — HYDRALAZINE HCL 10 MG PO TABS: 10.0000 mg | ORAL_TABLET | Freq: Three times a day (TID) | ORAL | 2 refills | Status: DC

## 2016-11-20 MED ORDER — NEBIVOLOL HCL 20 MG PO TABS: 10.0000 mg | ORAL_TABLET | Freq: Two times a day (BID) | ORAL | 2 refills | Status: DC

## 2016-11-21 ENCOUNTER — Encounter: Payer: Self-pay | Admitting: Family Medicine

## 2016-11-23 ENCOUNTER — Encounter: Payer: Self-pay | Admitting: Family Medicine

## 2017-01-02 ENCOUNTER — Encounter: Payer: Self-pay | Admitting: Family Medicine

## 2017-01-02 ENCOUNTER — Other Ambulatory Visit: Payer: Self-pay | Admitting: Family Medicine

## 2017-02-22 ENCOUNTER — Encounter: Payer: Self-pay | Admitting: Family Medicine

## 2017-02-23 ENCOUNTER — Other Ambulatory Visit: Payer: Self-pay | Admitting: Family Medicine

## 2017-02-23 DIAGNOSIS — F419 Anxiety disorder, unspecified: Secondary | ICD-10-CM

## 2017-02-23 NOTE — Telephone Encounter (Signed)
Spoke to patient who reports she will be out of her klonopin on her dose evening dose on 02/24/17; advised patient that her PCP was not in office to approve refill; PCP to return on 02/24/17; refill request will be sent to provider for review. Patient voiced understanding.

## 2017-02-24 MED ORDER — CLONAZEPAM 0.5 MG PO TABS: ORAL_TABLET | ORAL | 3 refills | Status: DC

## 2017-02-24 NOTE — Telephone Encounter (Signed)
Rx phoned in.   

## 2017-02-24 NOTE — Telephone Encounter (Signed)
Rx last filled on 01/24/17

## 2017-02-24 NOTE — Telephone Encounter (Signed)
It is Ok to call in for Klonopin 0.5 mg to continue bid #60/3.  Thanks

## 2017-03-15 ENCOUNTER — Encounter: Payer: Self-pay | Admitting: Family Medicine

## 2017-03-17 ENCOUNTER — Other Ambulatory Visit: Payer: Self-pay | Admitting: Family Medicine

## 2017-03-19 NOTE — Progress Notes (Signed)
HPI:   MeganMegan Glover is a 71 y.o. female, who is here today to follow on some chronic medical problems.  I saw her last on 11/17/16 for f/u. Last OV she was c/o RLQ pain and intermittent bulging area. S/P right inguinal hernia repair with mesh placement.  She feels like RLQ is not as soft as LLQ and more prominent, she is able to "push it back." She is requesting an abdominal CT. S/P right inguinal hernia repair 05/2014, mesh placed. She has been concerned about possible complications related to mesh. She has refused to follow with Dr Michaell CowingGross, [facility]. She would like to see a different provider.   She also brought copy of abdominal CT report she had in 08/2013, inquiring about "schmorl's nodes", "bone island proximal left femoral diaphysis,and facet degenerative change"  She was also c/o vaginal burning sensation, Hx of atrophic vaginitis.Todays she states that burning sensation is on external genitals and inner/upper thighs. Also mentions that vaginal introitus seems red. She denies vaginal bleeding or discharge. She has tried different OTC products but have not helped, including antifungal medications, zinc oxide, Aveno skin relief, avocado oil among others.  She also has noted intermittent rash but she does not seem to be related to burning. She has Hx of lower back pain, sometimes radiated to LLE. Pain is "bed" sometimes, she is requesting refills on Oxycodone 5 mg. She has taken Oxycodone intermittently since right hip fracture. Pain is exacerbated by prolonged walking and standing. Limitation of ROM. Denies recent trauma. Denies saddle anesthesia or changes in bowel/urine continence.   Lower intermittent abdominal pain, cramps, attributed to IBS. States that sometimes she has pain on right rib cage, exacerbated with certain movements and alleviated by rest. She has had this pain intermittently for years. Moderate to severe pain,no radiated.  She denies changes in  bowel habits, nausea,vomiting,or urinary symptoms. Hx of fibromyalgia, she is on Flexeril.   Anxiety: She is currently on Clonazepam 0.5 mg bid. She has not tolerated SSRI and SNRI meds in the past.  Sons are dealing with serious illness liver disease and Lupus. She denies suicidal thoughts. Mediation is helping with anxiety,she is dealing well with stress, denies side effects.   HTN:  She has tried many antihypertensive medication and did not tolerate them well. She does not check BP at home because exacerbate anxiety.  She is on Cozaar 100 mg daily, Hydralazine 10 mg tid,and Bystolic 10 mg bid.   Denies severe/frequent headache, visual changes, chest pain, dyspnea, palpitation, claudication, focal weakness, or edema.   Review of Systems  Constitutional: Positive for fatigue. Negative for activity change, appetite change, fever and unexpected weight change.  HENT: Negative for mouth sores, nosebleeds, sore throat and trouble swallowing.   Eyes: Negative for redness and visual disturbance.  Respiratory: Negative for cough, shortness of breath and wheezing.   Cardiovascular: Negative for chest pain, palpitations and leg swelling.  Gastrointestinal: Positive for abdominal pain and constipation. Negative for nausea and vomiting.       Negative for changes in bowel habits.  Endocrine: Negative for cold intolerance and heat intolerance.  Genitourinary: Negative for decreased urine volume, dysuria, frequency, hematuria, vaginal bleeding and vaginal discharge.  Musculoskeletal: Positive for arthralgias, back pain, gait problem and myalgias.  Skin: Positive for rash. Negative for wound.  Allergic/Immunologic: Positive for environmental allergies.  Neurological: Negative for syncope, weakness and headaches.  Psychiatric/Behavioral: Negative for confusion and hallucinations. The patient is nervous/anxious.  Current Outpatient Prescriptions on File Prior to Visit  Medication Sig  Dispense Refill  . acetaminophen (TYLENOL) 500 MG tablet Take 1,000 mg by mouth every 8 (eight) hours as needed for moderate pain.    . B Complex-C (B-COMPLEX WITH VITAMIN C) tablet Take 1 tablet by mouth daily.    . Biotin (PA BIOTIN) 1000 MCG tablet Take 1,000 mcg by mouth daily.    . clonazePAM (KLONOPIN) 0.5 MG tablet TAKE 1 TABLET BY MOUTH TWICE DAILY AT 10:00 AM AND 10:00 PM 60 tablet 3  . cyclobenzaprine (FLEXERIL) 10 MG tablet Take 0.5-1 tablets (5-10 mg total) by mouth daily as needed for muscle spasms. 90 tablet 1  . hydrALAZINE (APRESOLINE) 10 MG tablet Take 1 tablet (10 mg total) by mouth 3 (three) times daily. 180 tablet 2  . hydrocortisone (ANUSOL-HC) 2.5 % rectal cream Place 1 application rectally 2 (two) times daily. 30 g 1  . levothyroxine (SYNTHROID, LEVOTHROID) 50 MCG tablet TAKE 1 TABLET(50 MCG) BY MOUTH DAILY BEFORE BREAKFAST 90 tablet 2  . losartan (COZAAR) 100 MG tablet TAKE 1 TABLET(100 MG) BY MOUTH EVERY MORNING 90 tablet 1  . Multiple Vitamins-Minerals (ZINC PO) Take 1 capsule by mouth daily.    . Nebivolol HCl (BYSTOLIC) 20 MG TABS Take 0.5 tablets (10 mg total) by mouth 2 (two) times daily. 90 tablet 2  . Omega-3 Fatty Acids (FISH OIL) 1000 MG CAPS Take 1 capsule by mouth daily.    Marland Kitchen OVER THE COUNTER MEDICATION Apply 2 drops to eye at bedtime as needed (for itchy dry eyes.).    Marland Kitchen PROAIR HFA 108 (90 Base) MCG/ACT inhaler INHALE 2 PUFFS INTO THE LUNGS EVERY 4 HOURS AS NEEDED 8.5 g 3   No current facility-administered medications on file prior to visit.      Past Medical History:  Diagnosis Date  . Anemia   . Anxiety   . Cataract    bilateral -left > right  . Clostridium difficile diarrhea    05-09-14 denies any problems at this time  . Depression   . Fibromyalgia   . History of diverticulosis   . Hypertension   . Hypothyroidism   . IBS (irritable bowel syndrome)   . Neuromuscular disorder (HCC)    fibromyalgia  . Thyroid disease    Past Surgical History:   Procedure Laterality Date  . Cataract removal 2015 Bilateral   . DILATION AND CURETTAGE OF UTERUS    . INGUINAL HERNIA REPAIR Bilateral 05/16/2014   Procedure: LAPAROSCOPIC EXPLORATION AND REPAIR OF BILATERAL FEMEROL AND BILATERAL INGUINAL HERNIAS  WITH MESH;  Surgeon: Karie Soda, MD;  Location: [facility];  Service: General;  Laterality: Bilateral;  . INTRAMEDULLARY (IM) NAIL INTERTROCHANTERIC Right 02/21/2014   Procedure: INTRAMEDULLARY (IM) NAIL INTERTROCHANTRIC;  Surgeon: Eulas Post, MD;  Location: [facility] OR;  Service: Orthopedics;  Laterality: Right;     Allergies  Allergen Reactions  . Clindamycin/Lincomycin     CDIF  . Demerol [Meperidine] Nausea And Vomiting, Other (See Comments) and Hypertension    Severe headache  . Erythromycin Diarrhea  . Hydrochlorothiazide Other (See Comments)    Weakness/malaise  . Levaquin [Levofloxacin]     Yeast infection  . Spironolactone Other (See Comments)    Weakness/malaise  . Sulfa Antibiotics Other (See Comments)    Anal itching    Social History   Social History  . Marital status: Married    Spouse name: Wilmarie Sparlin  . Number of children: N/A  . Years of education:  N/A   Social History Main Topics  . Smoking status: Former Smoker    Packs/day: 1.50    Years: 25.00    Types: Cigarettes    Quit date: 05/09/1980  . Smokeless tobacco: Never Used  . Alcohol use No  . Drug use: No  . Sexual activity: Not Currently   Other Topics Concern  . None   Social History Narrative  . None    Vitals:   03/20/17 1353  BP: 140/90  Pulse: 61  Resp: 12  O2 sat at RA 95% Body mass index is 25.92 kg/m.   Physical Exam  Nursing note and vitals reviewed. Constitutional: She is oriented to person, place, and time. She appears well-developed and well-nourished. No distress.  HENT:  Head: Normocephalic and atraumatic.  Right Ear: Decreased hearing is noted.  Left Ear: Decreased hearing is noted.  Mouth/Throat: Oropharynx is clear  and moist and mucous membranes are normal.  Eyes: Pupils are equal, round, and reactive to light. Conjunctivae and EOM are normal.  Cardiovascular: Normal rate and regular rhythm.   No murmur heard. Pulses:      Dorsalis pedis pulses are 2+ on the right side, and 2+ on the left side.  Respiratory: Effort normal and breath sounds normal. No respiratory distress.  GI: Soft. There is no hepatomegaly. There is generalized tenderness. There is no rigidity, no rebound and no guarding. A hernia is present. Hernia confirmed positive in the right inguinal area.    Right inguinal mass, about 3 cm, more prominent with valsalva and while standing up. Able to reduce but it causes pain.  Genitourinary: There is no rash, tenderness or lesion on the right labia. There is no rash, tenderness or lesion on the left labia. Uterus is not enlarged and not tender. Cervix exhibits no motion tenderness and no discharge. Right adnexum displays no mass and no tenderness. Left adnexum displays no mass and no tenderness. No tenderness or bleeding in the vagina. No vaginal discharge found.  Genitourinary Comments: Vaginal atrophic changes.I do not appreciate edema or erythema.  Musculoskeletal: She exhibits no edema.  Trigger points on upper, mid,and lower back; chest wall, arms,forearms, thighs,and pretibial area.  Lymphadenopathy:    She has no cervical adenopathy.       Right: No inguinal adenopathy present.       Left: No inguinal adenopathy present.  Neurological: She is alert and oriented to person, place, and time. She has normal strength. Coordination normal.  Stable gait assisted with a cane.  Skin: Skin is warm. No rash noted. No erythema.  Psychiatric: Her mood appears anxious.  Well groomed, good eye contact.    ASSESSMENT AND PLAN:    [name] was seen today for follow-up.  Diagnoses and all orders for this visit:  Back pain with left-sided radiculopathy  Chronic. Explained that schmorl's  nodes could be related degenerative/herniated disc but not always symptomatic. Problem is stable but will consider ortho referral and/or lumbar MRI. Some side effects of Oxycodone discussed. Instructed about warning signs.  -     oxyCODONE (OXY IR/ROXICODONE) 5 MG immediate release tablet; Take 1 tablet (5 mg total) by mouth every 12 (twelve) hours as needed for moderate pain, severe pain or breakthrough pain ((for MODERATE breakthrough pain)).  Right inguinal hernia  Instructed about warning signs. Surgery referral placed and abd/pelvic CT ordered.  -     CT ABDOMEN PELVIS W CONTRAST; Future -     Ambulatory referral to General Surgery  Lower  abdominal pain  We discussed possible causes, she has Hx of IBS, ? Mesh irritation. Pelvic examination otherwise negative for masses or abnormalities that can explained pain.  -     CT ABDOMEN PELVIS W CONTRAST; Future  Generalized anxiety disorder  Symptomatic still, stable. She has not tolerated other medications. No changes in Clonazepam. F/U in 3-4 months.  Essential hypertension  Adequately controlled. No changes in current management. DASH-low salt diet to continue. Eye exam recommended annually. F/U in 4 months, before if needed.  Irritable bowel syndrome with constipation  She has not tolerated Linzess or Amitiza. Continue adequate fiber and fluid intake. Prune juice has helped in the past.  Fibromyalgia  Continue Flexeril 5-10 mg daily, some side effects discussed. Poor tolerance to other medications like Cymbalta and Gabapentin.  Burning sensation of vulva  We discussed possible etiologies, description of symptoms is ambiguous. Vaginal introitus burning most likely related to atrophic changes. External genital burning could be related to skin irritation. Rash reported (not present today), ? Intertrigo. Also to consider radicular pain. We will nee to consider lumbar MRI or ortho referral if this is  persistent.   2:02 pm to 2:50 pm > 50% was dedicated to discussion of all above Dx, prognosis, options in regard to work-up and treatment options, coordination of care. Extra time was needed for reassuring,reviewed of imaging report,and education about some of her chronic medical problems.    -Megan Glover was advised to return sooner than planned today if new concerns arise.       Tanya Crothers G. [place], MD  [facility]. [place] office.

## 2017-03-20 ENCOUNTER — Encounter: Payer: Self-pay | Admitting: Family Medicine

## 2017-03-20 ENCOUNTER — Ambulatory Visit (INDEPENDENT_AMBULATORY_CARE_PROVIDER_SITE_OTHER): Payer: Medicare Other | Admitting: Family Medicine

## 2017-03-20 VITALS — BP 140/90 | HR 61 | Resp 12 | Ht 67.5 in | Wt 168.0 lb

## 2017-03-20 DIAGNOSIS — F411 Generalized anxiety disorder: Secondary | ICD-10-CM | POA: Diagnosis not present

## 2017-03-20 DIAGNOSIS — K4090 Unilateral inguinal hernia, without obstruction or gangrene, not specified as recurrent: Secondary | ICD-10-CM

## 2017-03-20 DIAGNOSIS — K581 Irritable bowel syndrome with constipation: Secondary | ICD-10-CM

## 2017-03-20 DIAGNOSIS — R1030 Lower abdominal pain, unspecified: Secondary | ICD-10-CM

## 2017-03-20 DIAGNOSIS — N9489 Other specified conditions associated with female genital organs and menstrual cycle: Secondary | ICD-10-CM | POA: Diagnosis not present

## 2017-03-20 DIAGNOSIS — M5410 Radiculopathy, site unspecified: Secondary | ICD-10-CM

## 2017-03-20 DIAGNOSIS — M797 Fibromyalgia: Secondary | ICD-10-CM | POA: Diagnosis not present

## 2017-03-20 DIAGNOSIS — I10 Essential (primary) hypertension: Secondary | ICD-10-CM

## 2017-03-20 DIAGNOSIS — G8929 Other chronic pain: Secondary | ICD-10-CM | POA: Insufficient documentation

## 2017-03-20 MED ORDER — OXYCODONE HCL 5 MG PO TABS: 5.0000 mg | ORAL_TABLET | Freq: Two times a day (BID) | ORAL | 0 refills | Status: DC | PRN

## 2017-03-20 NOTE — Patient Instructions (Addendum)
A few things to remember from today's visit:   Generalized anxiety disorder  Essential hypertension  RLQ abdominal pain - Plan: Ambulatory referral to General Surgery  Lower abdominal pain - Plan: CT ABDOMEN PELVIS W CONTRAST  Irritable bowel syndrome with constipation  Fibromyalgia  Burning sensation of vulva  No changes today. Burning sensation could be related to back , atrophic vaginalis, not sure about mesh reaction.   Please be sure medication list is accurate. If a new problem present, please set up appointment sooner than planned today.

## 2017-03-23 ENCOUNTER — Encounter: Payer: Self-pay | Admitting: Family Medicine

## 2017-03-24 ENCOUNTER — Encounter: Payer: Self-pay | Admitting: Family Medicine

## 2017-03-24 ENCOUNTER — Other Ambulatory Visit: Payer: Self-pay

## 2017-03-24 DIAGNOSIS — K4090 Unilateral inguinal hernia, without obstruction or gangrene, not specified as recurrent: Secondary | ICD-10-CM

## 2017-03-27 ENCOUNTER — Encounter: Payer: Self-pay | Admitting: Family Medicine

## 2017-03-30 ENCOUNTER — Ambulatory Visit
Admission: RE | Admit: 2017-03-30 | Discharge: 2017-03-30 | Disposition: A | Discharge status: Home / Self Care | Payer: Medicare Other | Source: Ambulatory Visit | Attending: Family Medicine | Admitting: Family Medicine

## 2017-03-30 DIAGNOSIS — R1030 Lower abdominal pain, unspecified: Secondary | ICD-10-CM

## 2017-03-30 DIAGNOSIS — K4090 Unilateral inguinal hernia, without obstruction or gangrene, not specified as recurrent: Secondary | ICD-10-CM

## 2017-03-30 MED ORDER — IOPAMIDOL (ISOVUE-300) INJECTION 61%
100.0000 mL | Freq: Once | INTRAVENOUS | Status: AC | PRN
  Administered 2017-03-30: 100 mL via INTRAVENOUS

## 2017-03-31 ENCOUNTER — Encounter: Payer: Self-pay | Admitting: Family Medicine

## 2017-04-02 ENCOUNTER — Encounter: Payer: Self-pay | Admitting: Family Medicine

## 2017-04-03 ENCOUNTER — Encounter: Payer: Self-pay | Admitting: Family Medicine

## 2017-04-19 ENCOUNTER — Encounter: Payer: Self-pay | Admitting: Family Medicine

## 2017-04-23 ENCOUNTER — Other Ambulatory Visit: Payer: Self-pay | Admitting: Family Medicine

## 2017-05-04 ENCOUNTER — Encounter: Payer: Self-pay | Admitting: Family Medicine

## 2017-06-17 ENCOUNTER — Encounter: Payer: Self-pay | Admitting: Family Medicine

## 2017-06-19 ENCOUNTER — Other Ambulatory Visit: Payer: Self-pay

## 2017-06-19 DIAGNOSIS — F419 Anxiety disorder, unspecified: Secondary | ICD-10-CM

## 2017-06-19 MED ORDER — CLONAZEPAM 0.5 MG PO TABS: ORAL_TABLET | ORAL | 3 refills | Status: DC

## 2017-06-19 NOTE — Telephone Encounter (Signed)
Rx for Clonazepam 0.5 mg can be called in to continue 1 tab bid. #60/3. Thanks, BJ

## 2017-06-19 NOTE — Telephone Encounter (Signed)
Rx last filled on 05/21/17.

## 2017-06-28 ENCOUNTER — Encounter: Payer: Self-pay | Admitting: Family Medicine

## 2017-06-28 NOTE — Telephone Encounter (Signed)
Yes, come in fasting. Thanks

## 2017-07-03 ENCOUNTER — Encounter: Payer: Self-pay | Admitting: Family Medicine

## 2017-07-03 NOTE — Telephone Encounter (Signed)
Yes we will be drawing labs, so please fast for your December 11th appointment.

## 2017-07-12 ENCOUNTER — Encounter: Payer: Self-pay | Admitting: Family Medicine

## 2017-07-18 ENCOUNTER — Other Ambulatory Visit: Payer: Self-pay | Admitting: Family Medicine

## 2017-07-18 DIAGNOSIS — I10 Essential (primary) hypertension: Secondary | ICD-10-CM

## 2017-07-19 ENCOUNTER — Ambulatory Visit: Payer: Medicare Other | Admitting: Family Medicine

## 2017-07-25 ENCOUNTER — Ambulatory Visit: Payer: Medicare Other | Admitting: Family Medicine

## 2017-07-28 ENCOUNTER — Ambulatory Visit (INDEPENDENT_AMBULATORY_CARE_PROVIDER_SITE_OTHER): Payer: Medicare Other | Admitting: Family Medicine

## 2017-07-28 ENCOUNTER — Other Ambulatory Visit: Payer: Self-pay | Admitting: Family Medicine

## 2017-07-28 ENCOUNTER — Encounter: Payer: Self-pay | Admitting: Family Medicine

## 2017-07-28 VITALS — BP 130/77 | HR 63 | Temp 97.6°F | Resp 16 | Ht 67.5 in | Wt 166.5 lb

## 2017-07-28 DIAGNOSIS — Z8719 Personal history of other diseases of the digestive system: Secondary | ICD-10-CM

## 2017-07-28 DIAGNOSIS — J449 Chronic obstructive pulmonary disease, unspecified: Secondary | ICD-10-CM

## 2017-07-28 DIAGNOSIS — I10 Essential (primary) hypertension: Secondary | ICD-10-CM

## 2017-07-28 DIAGNOSIS — F411 Generalized anxiety disorder: Secondary | ICD-10-CM

## 2017-07-28 DIAGNOSIS — K581 Irritable bowel syndrome with constipation: Secondary | ICD-10-CM | POA: Diagnosis not present

## 2017-07-28 DIAGNOSIS — G4700 Insomnia, unspecified: Secondary | ICD-10-CM | POA: Diagnosis not present

## 2017-07-28 DIAGNOSIS — Z9889 Other specified postprocedural states: Secondary | ICD-10-CM | POA: Diagnosis not present

## 2017-07-28 LAB — BASIC METABOLIC PANEL
BUN: 17 mg/dL (ref 6–23)
CO2: 29 mEq/L (ref 19–32)
CREATININE: 0.9 mg/dL (ref 0.40–1.20)
Calcium: 9.8 mg/dL (ref 8.4–10.5)
Chloride: 101 mEq/L (ref 96–112)
GFR: 65.37 mL/min (ref >60.00)
GLUCOSE: 95 mg/dL (ref 70–99)
Potassium: 5.1 mEq/L (ref 3.5–5.1)
Sodium: 138 mEq/L (ref 135–145)

## 2017-07-28 MED ORDER — MIRTAZAPINE 7.5 MG PO TABS: 7.5000 mg | ORAL_TABLET | Freq: Every day | ORAL | 1 refills | Status: DC

## 2017-07-28 MED ORDER — TIOTROPIUM BROMIDE MONOHYDRATE 18 MCG IN CAPS: 18.0000 ug | ORAL_CAPSULE | Freq: Every day | RESPIRATORY_TRACT | 4 refills | Status: DC

## 2017-07-28 NOTE — Progress Notes (Signed)
HPI:   [name] is a 71 y.o. female, who is here today to follow on some of her concerns after undergoing hernia repair.  On May 02, 2017 she underwent right inguinal hernia repair.  She had no complications during or after procedure and RLQ  pain she was having before surgery has resolved.  History of IBS constipation, complaining of worsening symptoms.  Last bowel movement about 3 days ago. She stopped drinking prune juice because he did not seem to be helping. She is using OTC "smooth move", which causes diarrhea. Periumbilical, intermittent, cramp abdominal pain.  She describes his pain as her usual for IBS. She has not noted blood in stool or melena.  She is also complaining of worsening insomnia, she is requesting alprazolam to help her with sleep.  According to patient, she has used this medication before and has helped. Anxiety, currently she is on Clonazepam 0.5 mg twice daily. She has not tolerated daily anxiolytic medications. She denies depressed mood or suicidal thoughts.  In the past she has tried OTC Melatonin, which did not help with her sleep.  She also tried Trazodone. +Fatigue.   HTN: Currently she is on Bystolic 20 mg half tablet twice daily, Cozaar 100 mg daily, and hydralazine 10 mg 3 times daily. She has not tolerated other antihypertensive medications. She does not check BP at home because this exacerbates anxiety.  Denies severe/frequent headache, visual changes, chest pain, dyspnea, palpitation, claudication, focal weakness, or edema.  COPD: Former smoker. She is being using her Albuterol inhaler more often than usual, about 4 times per week. Nonproductive cough and wheezing, both exacerbated by exertion. She denies fever, chills, or sick contact. In the past she has not tolerated inhaled corticosteroids due to developing oral thrush and ulcers thought to be caused by medications.  Review of Systems  Constitutional:  Positive for fatigue. Negative for activity change, appetite change and fever.  HENT: Negative for mouth sores, nosebleeds, sore throat and trouble swallowing.   Eyes: Negative for redness and visual disturbance.  Respiratory: Positive for cough and wheezing. Negative for shortness of breath.   Cardiovascular: Negative for chest pain, palpitations and leg swelling.  Gastrointestinal: Positive for abdominal pain and constipation. Negative for nausea and vomiting.       Negative for changes in bowel habits.  Endocrine: Negative for cold intolerance and heat intolerance.  Genitourinary: Negative for decreased urine volume, dysuria and hematuria.  Musculoskeletal: Positive for gait problem and myalgias.  Skin: Negative for pallor and rash.  Allergic/Immunologic: Positive for environmental allergies.  Neurological: Negative for seizures, syncope, weakness and headaches.  Psychiatric/Behavioral: Positive for sleep disturbance. Negative for confusion. The patient is nervous/anxious.       Current Outpatient Medications on File Prior to Visit  Medication Sig Dispense Refill  . acetaminophen (TYLENOL) 500 MG tablet Take 1,000 mg by mouth every 8 (eight) hours as needed for moderate pain.    Marland Kitchen aspirin 81 MG chewable tablet Chew 81 mg by mouth.    . Biotin (PA BIOTIN) 1000 MCG tablet Take 1,000 mcg by mouth daily.    . Cholecalciferol (VITAMIN D3) 2000 units TABS Take 1 tablet by mouth daily.    . clonazePAM (KLONOPIN) 0.5 MG tablet TAKE 1 TABLET BY MOUTH TWICE DAILY AT 10:00 AM AND 10:00 PM 60 tablet 3  . cyclobenzaprine (FLEXERIL) 10 MG tablet Take 0.5-1 tablets (5-10 mg total) by mouth daily as needed for muscle spasms. 90 tablet 1  .  hydrALAZINE (APRESOLINE) 10 MG tablet TAKE 1 TABLET(10 MG) BY MOUTH THREE TIMES DAILY 180 tablet 1  . hydrocortisone (ANUSOL-HC) 2.5 % rectal cream Place 1 application rectally 2 (two) times daily. 30 g 1  . levothyroxine (SYNTHROID, LEVOTHROID) 50 MCG tablet TAKE 1  TABLET(50 MCG) BY MOUTH DAILY BEFORE BREAKFAST 90 tablet 2  . losartan (COZAAR) 100 MG tablet TAKE 1 TABLET(100 MG) BY MOUTH EVERY MORNING 90 tablet 2  . Multiple Vitamins-Minerals (ZINC PO) Take 1 capsule by mouth daily.    . Nebivolol HCl (BYSTOLIC) 20 MG TABS Take 0.5 tablets (10 mg total) by mouth 2 (two) times daily. 90 tablet 2  . OVER THE COUNTER MEDICATION Apply 2 drops to eye at bedtime as needed (for itchy dry eyes.).    Marland Kitchen. oxyCODONE (OXY IR/ROXICODONE) 5 MG immediate release tablet Take 1 tablet (5 mg total) by mouth every 12 (twelve) hours as needed for moderate pain, severe pain or breakthrough pain ((for MODERATE breakthrough pain)). 20 tablet 0  . PROAIR HFA 108 (90 Base) MCG/ACT inhaler INHALE 2 PUFFS INTO THE LUNGS EVERY 4 HOURS AS NEEDED 8.5 g 3   No current facility-administered medications on file prior to visit.      Past Medical History:  Diagnosis Date  . Anemia   . Anxiety   . Cataract    bilateral -left > right  . Clostridium difficile diarrhea    05-09-14 denies any problems at this time  . Depression   . Fibromyalgia   . History of diverticulosis   . Hypertension   . Hypothyroidism   . IBS (irritable bowel syndrome)   . Neuromuscular disorder (HCC)    fibromyalgia  . Thyroid disease    Allergies  Allergen Reactions  . Ciprofloxacin Diarrhea  . Clindamycin/Lincomycin     CDIF  . Demerol [Meperidine] Nausea And Vomiting, Other (See Comments) and Hypertension    Severe headache  . Erythromycin Diarrhea  . Fluconazole Other (See Comments)  . Hydrochlorothiazide Other (See Comments)    Weakness/malaise  . Levaquin [Levofloxacin]     Yeast infection  . Lincomycin Diarrhea    CDIF  . Spironolactone Other (See Comments)    Weakness/malaise  . Sulfa Antibiotics Other (See Comments)    Anal itching  . Metronidazole Nausea Only    OTHER REACTION(S): Diarrhea OTHER REACTION(S): Diarrhea     Social History   Socioeconomic History  . Marital  status: Married    Spouse name: Megan Glover  . Number of children: None  . Years of education: None  . Highest education level: None  Social Needs  . Financial resource strain: None  . Food insecurity - worry: None  . Food insecurity - inability: None  . Transportation needs - medical: None  . Transportation needs - non-medical: None  Occupational History  . None  Tobacco Use  . Smoking status: Former Smoker    Packs/day: 1.50    Years: 25.00    Pack years: 37.50    Types: Cigarettes    Last attempt to quit: 05/09/1980    Years since quitting: 37.2  . Smokeless tobacco: Never Used  Substance and Sexual Activity  . Alcohol use: No    Alcohol/week: 0.0 oz  . Drug use: No  . Sexual activity: Not Currently  Other Topics Concern  . None  Social History Narrative  . None    Vitals:   07/28/17 1137  BP: 130/77  Pulse: 63  Resp: 16  Temp: 97.6 F (36.4  C)  SpO2: 97%   Body mass index is 25.69 kg/m.    Physical Exam  Nursing note and vitals reviewed. Constitutional: She is oriented to person, place, and time. She appears well-developed and well-nourished. No distress.  HENT:  Head: Normocephalic and atraumatic.  Mouth/Throat: Oropharynx is clear and moist. Mucous membranes are dry.  Eyes: Conjunctivae are normal. Pupils are equal, round, and reactive to light.  Cardiovascular: Normal rate and regular rhythm.  No murmur heard. Pulses:      Dorsalis pedis pulses are 2+ on the right side, and 2+ on the left side.  Respiratory: Effort normal and breath sounds normal. No respiratory distress.  GI: Soft. Bowel sounds are normal. She exhibits no mass. There is no hepatomegaly. There is tenderness in the periumbilical area. There is no rigidity, no rebound and no guarding.    RLQ linear scar, S/P hernia repair. No tenderness in this area.  Musculoskeletal: She exhibits no edema or tenderness.  Lymphadenopathy:    She has no cervical adenopathy.  Neurological: She is  alert and oriented to person, place, and time. She has normal strength.  Stable gait assisted with a cane.  Skin: Skin is warm. No rash noted. No erythema.  Psychiatric: Her mood appears anxious.  Well groomed, good eye contact.      ASSESSMENT AND PLAN:   [name] was seen today for follow-up.  Diagnoses and all orders for this visit:  Lab Results  Component Value Date   CREATININE 0.90 07/28/2017   BUN 17 07/28/2017   NA 138 07/28/2017   K 5.1 07/28/2017   CL 101 07/28/2017   CO2 29 07/28/2017    Insomnia, unspecified type  Good sleep hygiene recommended. Alprazolam is not appropriate, she is already on Clonazepam. After discussion of a few pharmacologic treatment options and some side effects, she agrees with trying Remeron (which may also help with anxiety). She can try to decrease Clonazepam night dose to 1/2 tab. F/U in 1-2 months.  -     mirtazapine (REMERON) 7.5 MG tablet; Take 1 tablet (7.5 mg total) by mouth at bedtime.  Irritable bowel syndrome with constipation  She takes Oxycodone as needed and took it more frequent right after surgery, side effects discussed. She has not tolerated medications in the past, Linzess and Amitiza caused diarrhea. She can try again Prune juice. Adequate fiber and fluid intake.  Chronic obstructive pulmonary disease, unspecified COPD type (HCC)  Not well controlled. She has not tolerated ICS in the past. She will try Spiriva daily. Continue Albuterol inh prn.  -     tiotropium (SPIRIVA HANDIHALER) 18 MCG inhalation capsule; Place 1 capsule (18 mcg total) into inhaler and inhale daily.  Essential hypertension  Adequately controlled. No changes in current management. DASH -low salt diet recommended. Eye exam current. F/U in 6 months, before if needed.  -     Basic metabolic panel  Generalized anxiety disorder  Stable,not well controlled. She has not tolerated some anxiolytic medications in the past. Remeron  may help. No changes in Clonazepam. Instructed about warning signs. F/U in 1-2 months.  -     mirtazapine (REMERON) 7.5 MG tablet; Take 1 tablet (7.5 mg total) by mouth at bedtime.  S/P inguinal hernia repair  Healing well. RLQ resolved. No further recommendations in this regard. F/U as needed.     -Ms. Adriana SimasKathleen Louise Glover was advised to return sooner than planned today if new concerns arise.       Megan RhodesBetty  G. [place], MD  [facility]. [place] office.

## 2017-07-28 NOTE — Patient Instructions (Addendum)
A few things to remember from today's visit:   Insomnia, unspecified type - Plan: mirtazapine (REMERON) 7.5 MG tablet  Generalized anxiety disorder - Plan: mirtazapine (REMERON) 7.5 MG tablet  Irritable bowel syndrome with constipation  Chronic obstructive pulmonary disease, unspecified COPD type (HCC) - Plan: tiotropium (SPIRIVA HANDIHALER) 18 MCG inhalation capsule  Essential hypertension - Plan: Basic metabolic panel  Try to decrease Clonazepam  To 1/2 tab at bedtime. Remeron 1/2 tab at bedtime, can increase to 1 tab in 1 week if tolerated well.  Please be sure medication list is accurate. If a new problem present, please set up appointment sooner than planned today.

## 2017-08-09 ENCOUNTER — Other Ambulatory Visit: Payer: Self-pay | Admitting: Family Medicine

## 2017-08-09 DIAGNOSIS — J449 Chronic obstructive pulmonary disease, unspecified: Secondary | ICD-10-CM

## 2017-08-20 ENCOUNTER — Encounter: Payer: Self-pay | Admitting: Family Medicine

## 2017-08-21 NOTE — Telephone Encounter (Signed)
°  Relation to pt: self Call back number: [phone]   [facility] [phone] - [place], [place] - [address] [facility] [address] [phone] (Phone) [phone] (Fax)     Reason for call:  Patient checking on the status of my chart message, patient states she's without transportation and patient spouse is currently in the hospital with pneumonia therefore declined appointment, please advise my chart message or call via phone.

## 2017-09-01 ENCOUNTER — Other Ambulatory Visit: Payer: Self-pay | Admitting: *Deleted

## 2017-09-01 ENCOUNTER — Ambulatory Visit (INDEPENDENT_AMBULATORY_CARE_PROVIDER_SITE_OTHER): Payer: Medicare Other | Admitting: Family Medicine

## 2017-09-01 ENCOUNTER — Encounter: Payer: Self-pay | Admitting: Family Medicine

## 2017-09-01 VITALS — BP 126/82 | HR 68 | Temp 97.6°F | Resp 16 | Ht 67.5 in | Wt 168.0 lb

## 2017-09-01 DIAGNOSIS — F411 Generalized anxiety disorder: Secondary | ICD-10-CM | POA: Diagnosis not present

## 2017-09-01 DIAGNOSIS — J449 Chronic obstructive pulmonary disease, unspecified: Secondary | ICD-10-CM | POA: Diagnosis not present

## 2017-09-01 DIAGNOSIS — G4700 Insomnia, unspecified: Secondary | ICD-10-CM | POA: Diagnosis not present

## 2017-09-01 DIAGNOSIS — K581 Irritable bowel syndrome with constipation: Secondary | ICD-10-CM | POA: Diagnosis not present

## 2017-09-01 MED ORDER — ALBUTEROL SULFATE (2.5 MG/3ML) 0.083% IN NEBU: 2.5000 mg | INHALATION_SOLUTION | Freq: Every day | RESPIRATORY_TRACT | 1 refills | Status: DC | PRN

## 2017-09-01 NOTE — Progress Notes (Signed)
HPI:   [name] is a 72 y.o. female, who is here today to follow on recent OV.   She was seen on 07/28/17, when she had a few concerns in regard to her chronic problems.  Since her last office visit she has been evaluated in the ED, diagnosed with acute bronchitis (08/18/2017). She was discharged on Azithromycin, which she did not take because she has not tolerated these well in the past. Symptoms have greatly improved. She still has some cough. No dyspnea or wheezing for the past couple days.  IBS-constipation: Last visit she was complaining of worsening of constipation.  In the past she has not tolerated pharmacologic treatment with Linzess or Amitiza, they both caused diarrhea. Last visit I recommended trying prune juice again, which helped in the past. She did try and it caused diarrhea,so she discontinued. She thinks she is back to her baseline. Last bowel movement yesterday. Abdominal pain, diffuse, stable, no nausea or vomiting and no blood in stool.  COPD: Last office visit Spiriva was added because she was c/o worsening cough. She states that she felt worse when she used Spiriva and discontinued after 2-3 days. She would like a prescription for Albuterol nebulizer solution, so she can use as needed when she feels like she is having an exacerbation. She is currently on Albuterol inhaler, which she uses as needed.  She has no noted fever or chills.   She was also complaining of insomnia, she agreed to try Remeron but after reading side effects she decided not to take it. History of anxiety, currently she is on Clonazepam 0.5 mg twice daily. She is sleeping better since stress level improved.One of her sons finally got his disability.    Review of Systems  Constitutional: Positive for fatigue. Negative for activity change, appetite change, fever and unexpected weight change.  HENT: Positive for postnasal drip and rhinorrhea. Negative for mouth  sores, nosebleeds and trouble swallowing.   Eyes: Negative for redness and visual disturbance.  Respiratory: Positive for cough. Negative for shortness of breath and wheezing.   Cardiovascular: Negative for chest pain, palpitations and leg swelling.  Gastrointestinal: Positive for constipation. Negative for abdominal pain, nausea and vomiting.       Negative for changes in bowel habits.  Genitourinary: Negative for decreased urine volume and hematuria.  Musculoskeletal: Positive for gait problem.  Neurological: Negative for syncope, weakness and headaches.  Psychiatric/Behavioral: Positive for sleep disturbance. Negative for confusion. The patient is nervous/anxious.       Current Outpatient Medications on File Prior to Visit  Medication Sig Dispense Refill  . acetaminophen (TYLENOL) 500 MG tablet Take 1,000 mg by mouth every 8 (eight) hours as needed for moderate pain.    Marland Kitchen. aspirin 81 MG chewable tablet Chew 81 mg by mouth.    . Biotin (PA BIOTIN) 1000 MCG tablet Take 1,000 mcg by mouth daily.    . Cholecalciferol (VITAMIN D3) 2000 units TABS Take 1 tablet by mouth daily.    . clonazePAM (KLONOPIN) 0.5 MG tablet TAKE 1 TABLET BY MOUTH TWICE DAILY AT 10:00 AM AND 10:00 PM 60 tablet 3  . hydrALAZINE (APRESOLINE) 10 MG tablet TAKE 1 TABLET(10 MG) BY MOUTH THREE TIMES DAILY 180 tablet 1  . hydrocortisone (ANUSOL-HC) 2.5 % rectal cream Place 1 application rectally 2 (two) times daily. 30 g 1  . ipratropium-albuterol (DUONEB) 0.5-2.5 (3) MG/3ML SOLN Take 3 mLs by nebulization once.    Marland Kitchen. levothyroxine (SYNTHROID, LEVOTHROID) 50  MCG tablet TAKE 1 TABLET(50 MCG) BY MOUTH DAILY BEFORE BREAKFAST 90 tablet 2  . losartan (COZAAR) 100 MG tablet TAKE 1 TABLET(100 MG) BY MOUTH EVERY MORNING 90 tablet 2  . Multiple Vitamins-Minerals (ZINC PO) Take 1 capsule by mouth daily.    . Nebivolol HCl (BYSTOLIC) 20 MG TABS Take 0.5 tablets (10 mg total) by mouth 2 (two) times daily. 90 tablet 2  . OVER THE COUNTER  MEDICATION Apply 2 drops to eye at bedtime as needed (for itchy dry eyes.).    Marland Kitchen oxyCODONE (OXY IR/ROXICODONE) 5 MG immediate release tablet Take 1 tablet (5 mg total) by mouth every 12 (twelve) hours as needed for moderate pain, severe pain or breakthrough pain ((for MODERATE breakthrough pain)). 20 tablet 0  . PROAIR HFA 108 (90 Base) MCG/ACT inhaler INHALE 2 PUFFS INTO THE LUNGS EVERY 4 HOURS AS NEEDED 8.5 g 3   No current facility-administered medications on file prior to visit.      Past Medical History:  Diagnosis Date  . Anemia   . Anxiety   . Cataract    bilateral -left > right  . Clostridium difficile diarrhea    05-09-14 denies any problems at this time  . Depression   . Fibromyalgia   . History of diverticulosis   . Hypertension   . Hypothyroidism   . IBS (irritable bowel syndrome)   . Neuromuscular disorder (HCC)    fibromyalgia  . Thyroid disease    Allergies  Allergen Reactions  . Ciprofloxacin Diarrhea  . Clindamycin/Lincomycin     CDIF  . Demerol [Meperidine] Nausea And Vomiting, Other (See Comments) and Hypertension    Severe headache  . Erythromycin Diarrhea  . Fluconazole Other (See Comments)  . Hydrochlorothiazide Other (See Comments)    Weakness/malaise  . Levaquin [Levofloxacin]     Yeast infection  . Lincomycin Diarrhea    CDIF  . Spironolactone Other (See Comments)    Weakness/malaise  . Sulfa Antibiotics Other (See Comments)    Anal itching  . Metronidazole Nausea Only    OTHER REACTION(S): Diarrhea OTHER REACTION(S): Diarrhea     Social History   Socioeconomic History  . Marital status: Married    Spouse name: Megan Glover  . Number of children: None  . Years of education: None  . Highest education level: None  Social Needs  . Financial resource strain: None  . Food insecurity - worry: None  . Food insecurity - inability: None  . Transportation needs - medical: None  . Transportation needs - non-medical: None  Occupational  History  . None  Tobacco Use  . Smoking status: Former Smoker    Packs/day: 1.50    Years: 25.00    Pack years: 37.50    Types: Cigarettes    Last attempt to quit: 05/09/1980    Years since quitting: 37.3  . Smokeless tobacco: Never Used  Substance and Sexual Activity  . Alcohol use: No    Alcohol/week: 0.0 oz  . Drug use: No  . Sexual activity: Not Currently  Other Topics Concern  . None  Social History Narrative  . None    Vitals:   09/01/17 1345  BP: 126/82  Pulse: 68  Resp: 16  Temp: 97.6 F (36.4 C)  SpO2: 97%   Body mass index is 25.92 kg/m.   Physical Exam  Nursing note and vitals reviewed. Constitutional: She is oriented to person, place, and time. She appears well-developed and well-nourished. No distress.  HENT:  Head:  Normocephalic and atraumatic.  Mouth/Throat: Oropharynx is clear and moist and mucous membranes are normal.  Eyes: Conjunctivae are normal. Pupils are equal, round, and reactive to light.  Cardiovascular: Normal rate and regular rhythm.  No murmur heard. Pulses:      Dorsalis pedis pulses are 2+ on the right side, and 2+ on the left side.  Respiratory: Effort normal and breath sounds normal. No respiratory distress.  GI: Soft. She exhibits no mass. There is no tenderness.  Musculoskeletal: She exhibits no edema.  Lymphadenopathy:    She has no cervical adenopathy.  Neurological: She is alert and oriented to person, place, and time. She has normal strength.  Stable gait with no assistance today.  Skin: Skin is warm. No erythema.  Psychiatric: Her mood appears anxious.  Well groomed, good eye contact.     ASSESSMENT AND PLAN:   Ms. Rashena was seen today for follow-up.  Diagnoses and all orders for this visit:  Insomnia, unspecified type  Good sleep hygiene. Continue Clonazepam 0.5 mg at bedtime. Follow-up in 3-4 months.  Chronic obstructive pulmonary disease, unspecified COPD type (HCC)  Acute exacerbation due to acute  bronchitis resolved.  We discussed some side effects of Albuterol solution, recommend using it once daily as needed for wheezing/or dyspnea.  Instructed not to use albuterol inhaler and nebulizer solution at the same time. She voices understanding. F/U in 3-4 months.  -     Albuterol (PROVENTIL) (2.5 MG/3ML) 0.083% nebulizer solution; Take 3 mLs (2.5 mg total) by nebulization daily as needed for wheezing or shortness of breath.  Irritable bowel syndrome with constipation  Back to her baseline. Colonoscopy 11/2016. Adequate hydration and fiber intake.  She could try dried prunes or fruit. Instructed about warning signs.  Generalized anxiety disorder  Stable overall. No changes on Clonazepam. Follow-up in 3-4 months, before if needed.      Sammuel Blick G. [place], MD  [facility]. [place] office.

## 2017-09-02 ENCOUNTER — Other Ambulatory Visit: Payer: Self-pay | Admitting: Family Medicine

## 2017-09-02 DIAGNOSIS — M797 Fibromyalgia: Secondary | ICD-10-CM

## 2017-09-08 ENCOUNTER — Encounter: Payer: Self-pay | Admitting: Family Medicine

## 2017-09-08 LAB — HM MAMMOGRAPHY

## 2017-09-18 ENCOUNTER — Encounter: Payer: Self-pay | Admitting: Family Medicine

## 2017-09-20 ENCOUNTER — Encounter: Payer: Self-pay | Admitting: Family Medicine

## 2017-09-25 ENCOUNTER — Other Ambulatory Visit: Payer: Self-pay | Admitting: Family Medicine

## 2017-09-25 DIAGNOSIS — I10 Essential (primary) hypertension: Secondary | ICD-10-CM

## 2017-09-30 ENCOUNTER — Encounter: Payer: Self-pay | Admitting: Family Medicine

## 2017-10-03 ENCOUNTER — Other Ambulatory Visit: Payer: Self-pay | Admitting: Family Medicine

## 2017-10-03 DIAGNOSIS — I10 Essential (primary) hypertension: Secondary | ICD-10-CM

## 2017-10-03 MED ORDER — NEBIVOLOL HCL 20 MG PO TABS: 10.0000 mg | ORAL_TABLET | Freq: Two times a day (BID) | ORAL | 2 refills | Status: DC

## 2017-11-13 ENCOUNTER — Other Ambulatory Visit: Payer: Self-pay | Admitting: Family Medicine

## 2017-11-13 DIAGNOSIS — F419 Anxiety disorder, unspecified: Secondary | ICD-10-CM

## 2017-11-20 ENCOUNTER — Other Ambulatory Visit: Payer: Self-pay | Admitting: Family Medicine

## 2017-11-20 DIAGNOSIS — I10 Essential (primary) hypertension: Secondary | ICD-10-CM

## 2017-11-21 ENCOUNTER — Other Ambulatory Visit: Payer: Self-pay | Admitting: *Deleted

## 2017-11-21 DIAGNOSIS — I10 Essential (primary) hypertension: Secondary | ICD-10-CM

## 2017-11-21 MED ORDER — HYDRALAZINE HCL 10 MG PO TABS: ORAL_TABLET | ORAL | 1 refills | Status: DC

## 2017-12-21 ENCOUNTER — Telehealth: Payer: Self-pay | Admitting: Family Medicine

## 2017-12-22 MED ORDER — LEVOTHYROXINE SODIUM 50 MCG PO TABS: ORAL_TABLET | ORAL | 0 refills | Status: DC

## 2017-12-22 NOTE — Telephone Encounter (Signed)
pts medication was sent to wrong pharmacy  levothyroxine (SYNTHROID, LEVOTHROID) 50 MCG tablet    [id number] - [place], [place] - [address] [phone] (Phone) [phone] (Fax)

## 2017-12-22 NOTE — Addendum Note (Signed)
Addended by: Wilford Corner on: 12/22/2017 03:06 PM   Modules accepted: Orders

## 2017-12-22 NOTE — Telephone Encounter (Signed)
[facility] called and spoke to Laird, [facility] who says there is no prescription for Levothyroxine showing in the system, prescription sent.

## 2018-01-01 ENCOUNTER — Encounter: Payer: Self-pay | Admitting: Family Medicine

## 2018-01-05 ENCOUNTER — Encounter: Payer: Self-pay | Admitting: Family Medicine

## 2018-01-05 ENCOUNTER — Ambulatory Visit (INDEPENDENT_AMBULATORY_CARE_PROVIDER_SITE_OTHER): Payer: Medicare Other | Admitting: Family Medicine

## 2018-01-05 VITALS — BP 126/78 | HR 78 | Temp 97.8°F | Resp 16 | Ht 67.5 in | Wt 166.4 lb

## 2018-01-05 DIAGNOSIS — J449 Chronic obstructive pulmonary disease, unspecified: Secondary | ICD-10-CM | POA: Diagnosis not present

## 2018-01-05 DIAGNOSIS — R1084 Generalized abdominal pain: Secondary | ICD-10-CM

## 2018-01-05 DIAGNOSIS — I10 Essential (primary) hypertension: Secondary | ICD-10-CM

## 2018-01-05 DIAGNOSIS — E049 Nontoxic goiter, unspecified: Secondary | ICD-10-CM

## 2018-01-05 DIAGNOSIS — R739 Hyperglycemia, unspecified: Secondary | ICD-10-CM | POA: Diagnosis not present

## 2018-01-05 DIAGNOSIS — M797 Fibromyalgia: Secondary | ICD-10-CM

## 2018-01-05 DIAGNOSIS — E038 Other specified hypothyroidism: Secondary | ICD-10-CM | POA: Diagnosis not present

## 2018-01-05 DIAGNOSIS — E871 Hypo-osmolality and hyponatremia: Secondary | ICD-10-CM

## 2018-01-05 DIAGNOSIS — K581 Irritable bowel syndrome with constipation: Secondary | ICD-10-CM | POA: Diagnosis not present

## 2018-01-05 DIAGNOSIS — F411 Generalized anxiety disorder: Secondary | ICD-10-CM | POA: Diagnosis not present

## 2018-01-05 LAB — BASIC METABOLIC PANEL
BUN: 14 mg/dL (ref 6–23)
CHLORIDE: 96 meq/L (ref 96–112)
CO2: 27 mEq/L (ref 19–32)
Calcium: 9.9 mg/dL (ref 8.4–10.5)
Creatinine, Ser: 0.78 mg/dL (ref 0.40–1.20)
GFR: 77.02 mL/min (ref >60.00)
GLUCOSE: 100 mg/dL — AB (ref 70–99)
POTASSIUM: 5 meq/L (ref 3.5–5.1)
Sodium: 131 mEq/L — ABNORMAL LOW (ref 135–145)

## 2018-01-05 LAB — TSH: TSH: 2.17 u[IU]/mL (ref 0.35–4.50)

## 2018-01-05 LAB — HEMOGLOBIN A1C: Hgb A1c MFr Bld: 5.6 % (ref 4.6–6.5)

## 2018-01-05 NOTE — Assessment & Plan Note (Signed)
Stable. Continue Flexeril 10 mg 0.5 to 1 tablet daily as needed. She has tolerated medication well for years and aware of side effects. Fall precautions.

## 2018-01-05 NOTE — Assessment & Plan Note (Signed)
Well-controlled today. No changes in current management. Continue low-salt diet. Periodic eye exam every 1-2 years. F/U in 4 months.

## 2018-01-05 NOTE — Patient Instructions (Signed)
A few things to remember from today's visit:   Hyponatremia - Plan: Basic metabolic panel  Hyperglycemia - Plan: Hemoglobin A1c  Essential hypertension - Plan: Basic metabolic panel  Other specified hypothyroidism - Plan: TSH  Enlarged thyroid gland - Plan: US THYROID  No changes today.   Please be sure medication list is accurate. If a new problem present, please set up appointment sooner than planned today.

## 2018-01-05 NOTE — Assessment & Plan Note (Signed)
No changes in current management, will follow labs done today and will give further recommendations accordingly. Follow-up in 6 to 12 months. 

## 2018-01-05 NOTE — Assessment & Plan Note (Signed)
Stable. Continue albuterol nebulization daily as needed for moderate acute exacerbation and albuterol inhaler to treat mild symptoms as needed. She understands she should not use both at the same time.

## 2018-01-05 NOTE — Progress Notes (Signed)
HPI:   Megan Glover is a 72 y.o. female, who is here today for 5 months follow up.   She was last seen on 09/01/2017.   Since her last visit she has been in the ED, 12/21/2017. She presented to the ED complaining of 2 weeks of severe generalized abdominal pain, aching/cramp, intermittently.  History of IBS constipation, she has not tolerated medication in the past.  Usually symptoms have been well controlled with prune juice. She states that she was not having constipation at the time of the evaluation. During her last visit, 09/01/2017 she was complaining of worsening constipation. In the past she has not tolerated OTC treatment as well as Amitiza or Linzess.  CMP otherwsie normal. Glucose 121 on 12/21/16. Na++ 132.  CBC with no significant abnormalities.  -She is concerned about differences in reports from abdominal CT's.  Abdominal CT done in 03/2017 reported multiple rectosigmoid colon diverticula, some fatty infiltration of the pancreas,and right inguinal hernia with fat content.  She is concerned because of recent CT done in the ER states that "everything is normal."  CT abdomen/pelvis w/wo contrast:  #Lung bases: Normal. #Liver: Normal enhancement. No focal lesions. #Gallbladder: No calcifications #Spleen: Normal. #Pancreas: Normal #Adrenal glands: Normal. #Kidneys: Normal. #Retroperitoneum: No mass or adenopathy. #Aorta: No significant abnormality. #Bowel and mesentery: No bowel dilatation. No inflammatory changes seen. #Terminal ileum: Normal. -#Appendix: Normal  Pelvis: #No mass or adenopathy. #No inflammatory changes seen. #Bladder: No significant abnormality.  -She also has some questions about lab results of blood work done in the ER recently.  In general abdominal pain has improved greatly, she still having some lower abdominal pain, which is mild. She has chronic diffuse abdominal pain, attributed to IBS.   She is not sure if pain is at her baseline. Negative for fever, nausea, vomiting, or urinary symptoms. She has had some chills in the afternoon, chronic. Last bowel movement today.  She has not identified exacerbating or alleviating factors for abdominal pain and/or constipation. Last colonoscopy done in 11/15/2016: Multiple diverticula in sigmoid colon and polypectomy.  3 to 5 years follow-up was recommended.   Hypertension: Currently she is on Hydralazine 10 mg 3 times daily, Bystolic 20 mg half tablet twice daily, and losartan 100 mg daily. Concerned about recent losartan recall.  She has not tolerated other antihypertensive medications. Lab Results  Component Value Date   CREATININE 0.90 07/28/2017   BUN 17 07/28/2017   NA 138 07/28/2017   K 5.1 07/28/2017   CL 101 07/28/2017   CO2 29 07/28/2017     Anxiety: Currently she is on Lorazepam 0.5 mg twice daily. Problem is exacerbated by stress.  This medication also helps her sleep. She has not tolerated anxiolytic medications in the past. She is not reporting depression symptoms. Negative for suicidal thoughts.  Fibromyalgia: Currently she is on Flexeril 10 mg 1/2 tab daily as needed. 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 Symptoms are exacerbated by prolonged standing and alleviated by rest. Sometimes weather changes also exacerbates myalgias.  She uses a walker intermittently.   Hypothyroidism:  Currently she is on Levothyroxine 50 mcg daily Tolerating medication well, no side effects reported. She has not noted dysphagia, palpitations,changes in bowel habits, worsening tremor, cold/heat intolerance, or abnormal weight loss.  Lab Results  Component Value Date   TSH 1.23 11/17/2016    COPD:  She did not tolerate Spiriva or LABA/ICS medications. She is on albuterol solution for  nebulization and albuterol inhaler. She has needed about 2 albuterol Nebs twice since her last  visit.    Review of Systems  Constitutional: Positive for fatigue. Negative for activity change, appetite change and fever.  HENT: Negative for mouth sores, nosebleeds and trouble swallowing.   Eyes: Negative for redness and visual disturbance.  Respiratory: Negative for cough, shortness of breath and wheezing.   Cardiovascular: Negative for chest pain, palpitations and leg swelling.  Gastrointestinal: Positive for abdominal pain. Negative for abdominal distention, nausea and vomiting.       Negative for changes in bowel habits.  Genitourinary: Negative for decreased urine volume, dysuria and hematuria.  Musculoskeletal: Positive for arthralgias, gait problem and myalgias.  Neurological: Negative for syncope, weakness and headaches.  Psychiatric/Behavioral: Positive for sleep disturbance. Negative for confusion. The patient is nervous/anxious.     Current Outpatient Medications on File Prior to Visit  Medication Sig Dispense Refill  . acetaminophen (TYLENOL) 500 MG tablet Take 1,000 mg by mouth every 8 (eight) hours as needed for moderate pain.    Marland Kitchen albuterol (PROVENTIL) (2.5 MG/3ML) 0.083% nebulizer solution Take 3 mLs (2.5 mg total) by nebulization daily as needed for wheezing or shortness of breath. 30 mL 1  . aspirin 81 MG chewable tablet Chew 81 mg by mouth.    . Biotin 1000 MCG tablet Take by mouth.    . calcium-vitamin D (OSCAL 500/200 D-3) 500-200 MG-UNIT tablet Take by mouth.    . Cholecalciferol (VITAMIN D3) 2000 units TABS Take 1 tablet by mouth daily.    . clonazePAM (KLONOPIN) 0.5 MG tablet TAKE 1 TABLET BY MOUTH TWICE DAILY(10AM AND 10PM) 60 tablet 2  . cyclobenzaprine (FLEXERIL) 10 MG tablet TAKE 1/2 TO 1 TABLET BY MOUTH DAILY AS NEEDED FOR MUSCLE SPASMS 90 tablet 1  . hydrALAZINE (APRESOLINE) 10 MG tablet TAKE 1 TABLET(10 MG) BY MOUTH THREE TIMES DAILY 270 tablet 1  . hydrocortisone (ANUSOL-HC) 2.5 % rectal cream Place 1 application rectally 2 (two) times daily. 30 g 1  .  levothyroxine (SYNTHROID, LEVOTHROID) 50 MCG tablet TAKE 1 TABLET(50 MCG) BY MOUTH DAILY BEFORE BREAKFAST 90 tablet 0  . losartan (COZAAR) 100 MG tablet TAKE 1 TABLET(100 MG) BY MOUTH EVERY MORNING 90 tablet 2  . Multiple Vitamins-Minerals (ZINC PO) Take 1 capsule by mouth daily.    . Nebivolol HCl (BYSTOLIC) 20 MG TABS Take 0.5 tablets (10 mg total) by mouth 2 (two) times daily. 90 tablet 2  . OVER THE COUNTER MEDICATION Apply 2 drops to eye at bedtime as needed (for itchy dry eyes.).    Marland Kitchen oxyCODONE (OXY IR/ROXICODONE) 5 MG immediate release tablet Take 1 tablet (5 mg total) by mouth every 12 (twelve) hours as needed for moderate pain, severe pain or breakthrough pain ((for MODERATE breakthrough pain)). 20 tablet 0  . PROAIR HFA 108 (90 Base) MCG/ACT inhaler INHALE 2 PUFFS INTO THE LUNGS EVERY 4 HOURS AS NEEDED 8.5 g 3   No current facility-administered medications on file prior to visit.     Past Medical History:  Diagnosis Date  . Anemia   . Anxiety   . Cataract    bilateral -left > right  . Clostridium difficile diarrhea    05-09-14 denies any problems at this time  . Depression   . Fibromyalgia   . History of diverticulosis   . Hypertension   . Hypothyroidism   . IBS (irritable bowel syndrome)   . Neuromuscular disorder (HCC)    fibromyalgia  . Thyroid  disease    Past Surgical History:  Procedure Laterality Date  . Cataract removal 2015 Bilateral   . DILATION AND CURETTAGE OF UTERUS    . INGUINAL HERNIA REPAIR Bilateral 05/16/2014   Procedure: LAPAROSCOPIC EXPLORATION AND REPAIR OF BILATERAL FEMEROL AND BILATERAL INGUINAL HERNIAS  WITH MESH;  Surgeon: Karie Soda, MD;  Location: [facility];  Service: General;  Laterality: Bilateral;  . INTRAMEDULLARY (IM) NAIL INTERTROCHANTERIC Right 02/21/2014   Procedure: INTRAMEDULLARY (IM) NAIL INTERTROCHANTRIC;  Surgeon: Eulas Post, MD;  Location: [facility];  Service: Orthopedics;  Laterality: Right;    Allergies  Allergen Reactions  .  Sulfa Antibiotics Other (See Comments) and Itching    Anal itching "anal itching"  . Demerol  [Meperidine Hcl] Nausea And Vomiting  . Levofloxacin In D5w Other (See Comments)    Yeast infections.  "Yeast infections"  . Spironolactone Other (See Comments)    Weakness/malaise "Weakness"  . Clindamycin/Lincomycin     CDIF  . Erythromycin Diarrhea  . Erythromycin Base     OTHER REACTION(S): Diarrhea  . Fluconazole Other (See Comments)  . Hydrochlorothiazide Other (See Comments)    Weakness/malaise  . Lincomycin Diarrhea    CDIF  . Meperidine Nausea And Vomiting, Other (See Comments) and Hypertension    Severe headache OTHER REACTION(S): Hypertension  . Ciprofloxacin Diarrhea  . Levofloxacin Nausea Only    Yeast infection OTHER REACTION(S): Diarrhea  . Metronidazole Nausea Only    OTHER REACTION(S): Diarrhea OTHER REACTION(S): Diarrhea   . Other Itching    Social History   Socioeconomic History  . Marital status: Married    Spouse name: Phoenicia Pirie  . Number of children: Not on file  . Years of education: Not on file  . Highest education level: Not on file  Occupational History  . Not on file  Social Needs  . Financial resource strain: Not on file  . Food insecurity:    Worry: Not on file    Inability: Not on file  . Transportation needs:    Medical: Not on file    Non-medical: Not on file  Tobacco Use  . Smoking status: Former Smoker    Packs/day: 1.50    Years: 25.00    Pack years: 37.50    Types: Cigarettes    Last attempt to quit: 05/09/1980    Years since quitting: 37.6  . Smokeless tobacco: Never Used  Substance and Sexual Activity  . Alcohol use: No    Alcohol/week: 0.0 oz  . Drug use: No  . Sexual activity: Not Currently  Lifestyle  . Physical activity:    Days per week: Not on file    Minutes per session: Not on file  . Stress: Not on file  Relationships  . Social connections:    Talks on phone: Not on file    Gets together: Not on file      Attends religious service: Not on file    Active member of club or organization: Not on file    Attends meetings of clubs or organizations: Not on file    Relationship status: Not on file  Other Topics Concern  . Not on file  Social History Narrative  . Not on file    Vitals:   01/05/18 1418  BP: 126/78  Pulse: 78  Resp: 16  Temp: 97.8 F (36.6 C)  SpO2: 95%   Body mass index is 25.67 kg/m.   Physical Exam  Nursing note and vitals reviewed. Constitutional: She is oriented to  person, place, and time. She appears well-developed and well-nourished. No distress.  HENT:  Head: Normocephalic and atraumatic.  Mouth/Throat: Oropharynx is clear and moist and mucous membranes are normal.  Eyes: Pupils are equal, round, and reactive to light. Conjunctivae are normal.  Neck: No tracheal deviation present. Thyromegaly (? Nodules) present.  Cardiovascular: Normal rate and regular rhythm.  No murmur heard. Pulses:      Dorsalis pedis pulses are 2+ on the right side, and 2+ on the left side.  Respiratory: Effort normal and breath sounds normal. No respiratory distress.  GI: Soft. She exhibits no mass. There is no hepatomegaly. There is generalized tenderness. There is no rebound and no guarding.  Musculoskeletal: She exhibits no edema.  Lymphadenopathy:    She has no cervical adenopathy.  Neurological: She is alert and oriented to person, place, and time. She has normal strength.  Mildly unstable gait, not assisted today.  Skin: Skin is warm. No rash noted. No erythema.  Psychiatric: Her mood appears anxious.  Well groomed, good eye contact.     ASSESSMENT AND PLAN:   Megan Glover was seen today for 5 months follow-up.  Orders Placed This Encounter  Procedures  . US THYROID  . TSH  . Basic metabolic panel  . Hemoglobin A1c   Lab Results  Component Value Date   CREATININE 0.78 01/05/2018   BUN 14 01/05/2018   NA 131 (L) 01/05/2018   K 5.0 01/05/2018    CL 96 01/05/2018   CO2 27 01/05/2018   Lab Results  Component Value Date   TSH 2.17 01/05/2018   Lab Results  Component Value Date   HGBA1C 5.6 01/05/2018     Abdominal pain, generalized  Acute pain has greatly improved. She has history of abdominal pain, which is most likely related to IBS.  We went through reports of both abdominal CT (12/21/2017 and 04/03/2017). She was reassured about some of the findings.  I do not think further imaging is necessary at this time.   Hyponatremia  Caution with water intake. Some of her medications can contribute to the problem. Further recommendation will be given according to BMP results.  Hyperglycemia  Further recommendations will be given according to A1c results.  - Hemoglobin A1c  Enlarged thyroid gland  On examination today. She states that a few years ago her gynecologist also noted some thyroid enlargement, no further testing was pursued. Recommendation will be given according to [place] results.  - US THYROID; Future  Irritable bowel syndrome with constipation This problem seems to be better controlled in general.   Still can be contributing to her diffuse abdominal pain. Continue adequate fiber and fluid intake.    COPD (chronic obstructive pulmonary disease) (HCC) Stable. Continue albuterol nebulization daily as needed for moderate acute exacerbation and albuterol inhaler to treat mild symptoms as needed. She understands she should not use both at the same time.   Hypothyroidism No changes in current management, will follow labs done today and will give further recommendations accordingly. Follow-up in 6 to 12 months.  Generalized anxiety disorder Stable overall. Continue Clonazepam 0.5 mg twice daily as needed. Follow-up in 4 months, before if needed.  Fibromyalgia Stable. Continue Flexeril 10 mg 0.5 to 1 tablet daily as needed. She has tolerated medication well for years and aware of side effects. Fall  precautions.  Essential hypertension Well-controlled today. No changes in current management. Continue low-salt diet. Periodic eye exam every 1-2 years. F/U in 4 months.  2:44 Pm to 3:22 pm face to face OV. > 50% was dedicated to discussion of Dx, prognosis of some, reviewed of CT imaging reports,recent lab work, and coordination of care.      Betty G. [place], MD  [facility]. [place] office.

## 2018-01-05 NOTE — Assessment & Plan Note (Signed)
This problem seems to be better controlled in general.   Still can be contributing to her diffuse abdominal pain. Continue adequate fiber and fluid intake.

## 2018-01-05 NOTE — Assessment & Plan Note (Signed)
Stable overall. Continue Clonazepam 0.5 mg twice daily as needed. Follow-up in 4 months, before if needed.

## 2018-01-10 ENCOUNTER — Telehealth: Payer: Self-pay | Admitting: Family Medicine

## 2018-01-10 NOTE — Telephone Encounter (Signed)
Copied from CRM [id number]. Topic: General - Call Back - No Documentation >> Jan 10, 2018 10:06 AM Lorrine Kin, NT wrote: Reason for CRM: Patient calling back. States she is unsure what the call was about. States husband answered while she was in the shower and just told her to call them back. Please advise. CB#: [phone] Can leave a message on voicemail.

## 2018-01-10 NOTE — Telephone Encounter (Signed)
Spoke with patient, confirmed that she saw lab results on my chart and did not have any questions.

## 2018-01-13 ENCOUNTER — Other Ambulatory Visit: Payer: Self-pay | Admitting: Family Medicine

## 2018-01-13 DIAGNOSIS — F419 Anxiety disorder, unspecified: Secondary | ICD-10-CM

## 2018-01-15 ENCOUNTER — Encounter: Payer: Self-pay | Admitting: Family Medicine

## 2018-01-16 ENCOUNTER — Telehealth: Payer: Self-pay | Admitting: Family Medicine

## 2018-01-16 ENCOUNTER — Other Ambulatory Visit: Payer: Self-pay | Admitting: Family Medicine

## 2018-01-16 DIAGNOSIS — F419 Anxiety disorder, unspecified: Secondary | ICD-10-CM

## 2018-01-16 NOTE — Telephone Encounter (Signed)
Copied from CRM #[id number]. Topic: Quick Communication - See Telephone Encounter >> Jan 16, 2018 10:15 AM Rudi CocoLathan, [place]ou[place] Ackert M, NT wrote: CRM for notification. See Telephone encounter for: 01/16/18.  Pre approval needed for med. losartan (COZAAR) 100 MG tablet [[id number]]   [facility] [id number] - [place], [place] - [address] [place] [id number] Phone: [phone] Fax: [phone]

## 2018-01-17 ENCOUNTER — Other Ambulatory Visit: Payer: Self-pay | Admitting: Family Medicine

## 2018-01-17 DIAGNOSIS — F419 Anxiety disorder, unspecified: Secondary | ICD-10-CM

## 2018-01-17 MED ORDER — CLONAZEPAM 0.5 MG PO TABS: ORAL_TABLET | ORAL | 3 refills | Status: DC

## 2018-01-18 NOTE — Telephone Encounter (Signed)
I called [facility] for the pts insurance info to send a prior auth and per Lilia no PA is needed for this medication.

## 2018-01-23 ENCOUNTER — Ambulatory Visit
Admission: RE | Admit: 2018-01-23 | Discharge: 2018-01-23 | Disposition: A | Discharge status: Home / Self Care | Payer: Medicare Other | Source: Ambulatory Visit | Attending: Family Medicine | Admitting: Family Medicine

## 2018-01-23 DIAGNOSIS — E049 Nontoxic goiter, unspecified: Secondary | ICD-10-CM

## 2018-01-24 ENCOUNTER — Encounter: Payer: Self-pay | Admitting: Family Medicine

## 2018-01-28 ENCOUNTER — Encounter: Payer: Self-pay | Admitting: Family Medicine

## 2018-02-15 ENCOUNTER — Encounter: Payer: Self-pay | Admitting: Family Medicine

## 2018-02-25 ENCOUNTER — Other Ambulatory Visit: Payer: Self-pay | Admitting: Family Medicine

## 2018-02-25 DIAGNOSIS — I10 Essential (primary) hypertension: Secondary | ICD-10-CM

## 2018-02-25 MED ORDER — HYDRALAZINE HCL 10 MG PO TABS: ORAL_TABLET | ORAL | 2 refills | Status: DC

## 2018-03-27 ENCOUNTER — Other Ambulatory Visit: Payer: Self-pay | Admitting: Family Medicine

## 2018-03-28 ENCOUNTER — Encounter: Payer: Self-pay | Admitting: Family Medicine

## 2018-03-28 ENCOUNTER — Other Ambulatory Visit: Payer: Self-pay | Admitting: Family Medicine

## 2018-03-28 MED ORDER — LEVOTHYROXINE SODIUM 50 MCG PO TABS: ORAL_TABLET | ORAL | 2 refills | Status: DC

## 2018-04-09 ENCOUNTER — Ambulatory Visit: Payer: Medicare Other

## 2018-04-09 ENCOUNTER — Ambulatory Visit (INDEPENDENT_AMBULATORY_CARE_PROVIDER_SITE_OTHER): Payer: Medicare Other

## 2018-04-09 ENCOUNTER — Ambulatory Visit (INDEPENDENT_AMBULATORY_CARE_PROVIDER_SITE_OTHER): Payer: Medicare Other | Admitting: Family Medicine

## 2018-04-09 ENCOUNTER — Other Ambulatory Visit: Payer: Self-pay | Admitting: Family Medicine

## 2018-04-09 ENCOUNTER — Encounter: Payer: Self-pay | Admitting: Family Medicine

## 2018-04-09 VITALS — BP 124/78 | HR 76 | Temp 98.3°F | Resp 16 | Ht 67.5 in | Wt 165.0 lb

## 2018-04-09 DIAGNOSIS — W19XXXA Unspecified fall, initial encounter: Secondary | ICD-10-CM

## 2018-04-09 DIAGNOSIS — R0781 Pleurodynia: Secondary | ICD-10-CM

## 2018-04-09 DIAGNOSIS — J449 Chronic obstructive pulmonary disease, unspecified: Secondary | ICD-10-CM

## 2018-04-09 MED ORDER — OXYCODONE HCL 5 MG PO TABS: 5.0000 mg | ORAL_TABLET | Freq: Three times a day (TID) | ORAL | 0 refills | Status: DC | PRN

## 2018-04-09 MED ORDER — OXYCODONE HCL 5 MG PO TABS: 5.0000 mg | ORAL_TABLET | Freq: Three times a day (TID) | ORAL | 0 refills | Status: AC | PRN

## 2018-04-09 NOTE — Patient Instructions (Addendum)
A few things to remember from today's visit:   Fall, accidental, initial encounter - Plan: DG Ribs Unilateral W/Chest Right  Costal margin pain - Plan: oxyCODONE (OXY IR/ROXICODONE) 5 MG immediate release tablet, DG Ribs Unilateral W/Chest Right  Avoid shallow breathing. Oxycodone to 3 times per day as needed for pain. Continue topical Aspercreme with lidocaine.   Please be sure medication list is accurate. If a new problem present, please set up appointment sooner than planned today.

## 2018-04-09 NOTE — Progress Notes (Signed)
ACUTE VISIT   HPI:  Chief Complaint  Patient presents with  . Fall    [name] is a 72 y.o. female, who is here today with her husband complaining of right rib cage pain. Pain started after fall when she was walking on parking lot of [employer],tripped on curb,lost cane and fell. Landed on right side,hit right temple area and right side of trunk against pavement. No LOC,visual changes,or confusion after trauma. Fall was on 04/07/18 around 1 pm.  Right rib cage,constant,10/10 and now 8/10. She has taken Tylenol and applying topical asper cream but these are not helping.  History of COPD, she has had mild productive cough, denies hemoptysis. No fever, chills, dyspnea, or wheezing. She is on Albuterol inh prn.    Review of Systems  Constitutional: Positive for fatigue. Negative for chills.  Respiratory: Positive for cough. Negative for shortness of breath and wheezing.   Gastrointestinal: Negative for abdominal pain, nausea and vomiting.       No changes in bowel habits.  Genitourinary: Negative for decreased urine volume and hematuria.  Musculoskeletal: Positive for gait problem and myalgias.  Skin: Negative for rash and wound.  Neurological: Negative for weakness and headaches.  Psychiatric/Behavioral: Negative for confusion. The patient is nervous/anxious.       Current Outpatient Medications on File Prior to Visit  Medication Sig Dispense Refill  . acetaminophen (TYLENOL) 500 MG tablet Take 1,000 mg by mouth every 8 (eight) hours as needed for moderate pain.    Marland Kitchen albuterol (PROVENTIL) (2.5 MG/3ML) 0.083% nebulizer solution Take 3 mLs (2.5 mg total) by nebulization daily as needed for wheezing or shortness of breath. 30 mL 1  . aspirin 81 MG chewable tablet Chew 81 mg by mouth.    . Biotin 1000 MCG tablet Take by mouth.    . calcium-vitamin D (OSCAL 500/200 D-3) 500-200 MG-UNIT tablet Take by mouth.    . Cholecalciferol (VITAMIN D3) 2000 units  TABS Take 1 tablet by mouth daily.    . clonazePAM (KLONOPIN) 0.5 MG tablet TAKE 1 TABLET BY MOUTH TWICE DAILY(10AM AND 10PM) 60 tablet 3  . cyclobenzaprine (FLEXERIL) 10 MG tablet TAKE 1/2 TO 1 TABLET BY MOUTH DAILY AS NEEDED FOR MUSCLE SPASMS 90 tablet 1  . hydrALAZINE (APRESOLINE) 10 MG tablet TAKE 1 TABLET(10 MG) BY MOUTH THREE TIMES DAILY 180 tablet 2  . hydrocortisone (ANUSOL-HC) 2.5 % rectal cream Place 1 application rectally 2 (two) times daily. 30 g 1  . levothyroxine (SYNTHROID, LEVOTHROID) 50 MCG tablet Take 1 tablet by mouth daily before breakfast. 90 tablet 2  . losartan (COZAAR) 100 MG tablet TAKE 1 TABLET(100 MG) BY MOUTH EVERY MORNING 90 tablet 2  . Multiple Vitamins-Minerals (ZINC PO) Take 1 capsule by mouth daily.    . Nebivolol HCl (BYSTOLIC) 20 MG TABS Take 0.5 tablets (10 mg total) by mouth 2 (two) times daily. 90 tablet 2  . OVER THE COUNTER MEDICATION Apply 2 drops to eye at bedtime as needed (for itchy dry eyes.).    Marland Kitchen PROAIR HFA 108 (90 Base) MCG/ACT inhaler INHALE 2 PUFFS INTO THE LUNGS EVERY 4 HOURS AS NEEDED 8.5 g 3   No current facility-administered medications on file prior to visit.      Past Medical History:  Diagnosis Date  . Anemia   . Anxiety   . Cataract    bilateral -left > right  . Clostridium difficile diarrhea    05-09-14 denies any problems at  this time  . Depression   . Fibromyalgia   . History of diverticulosis   . Hypertension   . Hypothyroidism   . IBS (irritable bowel syndrome)   . Neuromuscular disorder (HCC)    fibromyalgia  . Thyroid disease    Allergies  Allergen Reactions  . Sulfa Antibiotics Other (See Comments) and Itching    Anal itching "anal itching"  . Demerol  [Meperidine Hcl] Nausea And Vomiting  . Levofloxacin In D5w Other (See Comments)    Yeast infections.  "Yeast infections"  . Spironolactone Other (See Comments)    Weakness/malaise "Weakness"  . Clindamycin/Lincomycin     CDIF  . Erythromycin Diarrhea  .  Erythromycin Base     OTHER REACTION(S): Diarrhea  . Fluconazole Other (See Comments)  . Hydrochlorothiazide Other (See Comments)    Weakness/malaise  . Lincomycin Diarrhea    CDIF  . Meperidine Nausea And Vomiting, Other (See Comments) and Hypertension    Severe headache OTHER REACTION(S): Hypertension  . Ciprofloxacin Diarrhea  . Levofloxacin Nausea Only    Yeast infection OTHER REACTION(S): Diarrhea  . Metronidazole Nausea Only    OTHER REACTION(S): Diarrhea OTHER REACTION(S): Diarrhea   . Other Itching    Social History   Socioeconomic History  . Marital status: Married    Spouse name: Megan Glover  . Number of children: Not on file  . Years of education: Not on file  . Highest education level: Not on file  Occupational History  . Not on file  Social Needs  . Financial resource strain: Not on file  . Food insecurity:    Worry: Not on file    Inability: Not on file  . Transportation needs:    Medical: Not on file    Non-medical: Not on file  Tobacco Use  . Smoking status: Former Smoker    Packs/day: 1.50    Years: 25.00    Pack years: 37.50    Types: Cigarettes    Last attempt to quit: 05/09/1980    Years since quitting: 37.9  . Smokeless tobacco: Never Used  Substance and Sexual Activity  . Alcohol use: No    Alcohol/week: 0.0 standard drinks  . Drug use: No  . Sexual activity: Not Currently  Lifestyle  . Physical activity:    Days per week: Not on file    Minutes per session: Not on file  . Stress: Not on file  Relationships  . Social connections:    Talks on phone: Not on file    Gets together: Not on file    Attends religious service: Not on file    Active member of club or organization: Not on file    Attends meetings of clubs or organizations: Not on file    Relationship status: Not on file  Other Topics Concern  . Not on file  Social History Narrative  . Not on file    Vitals:   04/09/18 1559  BP: 124/78  Pulse: 76  Resp: 16    Temp: 98.3 F (36.8 C)  SpO2: 96%   Body mass index is 25.46 kg/m.   Physical Exam  Nursing note and vitals reviewed. Constitutional: She is oriented to person, place, and time. She appears well-developed and well-nourished. She does not appear ill. No distress.  HENT:  Head: Normocephalic and atraumatic.  Right periocular ecchymosis,no hematoma. No deformity or crepitus appreciated. No deformity.  Eyes: Pupils are equal, round, and reactive to light. Conjunctivae and EOM are normal.  Cardiovascular:  Normal rate and regular rhythm.  Respiratory: Effort normal and breath sounds normal. No respiratory distress.  GI: Soft. She exhibits no mass. There is no tenderness.  Musculoskeletal: She exhibits no edema.       Thoracic back: She exhibits tenderness. She exhibits no bony tenderness.       Lumbar back: She exhibits no bony tenderness.       Back:       Arms: Tenderness upon palpation of anterior and posterior rib cage.  No deformity, local edema, or local erythema.   Lymphadenopathy:    She has no cervical adenopathy.  Neurological: She is alert and oriented to person, place, and time. She has normal strength. No cranial nerve deficit.  Unstable gait assisted with a cane.  Skin: Skin is warm. Ecchymosis noted. No rash noted. No erythema.  Small ecchymosis on posterior right side rib cage.  Psychiatric: Her mood appears anxious.  Well groomed, good eye contact.      ASSESSMENT AND PLAN:   [name] was seen today for fall.  Diagnoses and all orders for this visit:  Fall, accidental, initial encounter  Fall precautions discussed. Some of her medications can increased the risk for falls.  -     DG Ribs Unilateral W/Chest Right; Future  Costal margin pain  Physical examination today do not suggest fracture. Instructed to avoid shallow breathing. Further recommendations will be given according to maging results. Side effects of Oxycodone discussed.  -      oxyCODONE (OXY IR/ROXICODONE) 5 MG immediate release tablet; Take 1 tablet (5 mg total) by mouth every 8 (eight) hours as needed for up to 5 days for severe pain.  Chronic obstructive pulmonary disease, unspecified COPD type (HCC)  Lung auscultation today negative. Continue Albuterol inh prn. Instructed about warning signs.     Return if symptoms worsen or fail to improve.      Ott Zimmerle G. [place], MD  [facility]. [place] office.

## 2018-04-10 ENCOUNTER — Encounter: Payer: Self-pay | Admitting: Family Medicine

## 2018-04-12 ENCOUNTER — Encounter: Payer: Self-pay | Admitting: Family Medicine

## 2018-05-08 ENCOUNTER — Ambulatory Visit: Payer: Medicare Other | Admitting: Family Medicine

## 2018-05-11 ENCOUNTER — Other Ambulatory Visit: Payer: Self-pay | Admitting: Family Medicine

## 2018-05-11 DIAGNOSIS — F419 Anxiety disorder, unspecified: Secondary | ICD-10-CM

## 2018-05-12 ENCOUNTER — Encounter: Payer: Self-pay | Admitting: Family Medicine

## 2018-05-24 ENCOUNTER — Other Ambulatory Visit: Payer: Self-pay | Admitting: Family Medicine

## 2018-05-28 ENCOUNTER — Telehealth: Payer: Self-pay | Admitting: Family Medicine

## 2018-05-28 NOTE — Telephone Encounter (Signed)
Copied from CRM [id number]. Topic: Quick Communication - Rx Refill/Question >> May 28, 2018  2:12 PM Arlyss Gandy, NT wrote: Medication: PROAIR HFA 108 (90 Base) MCG/ACT inhaler   Pt states that Ventolin inhaler is more expensive and she prefers the Proair to be called in.  Has the patient contacted their pharmacy? Yes.   (Agent: If no, request that the patient contact the pharmacy for the refill.) (Agent: If yes, when and what did the pharmacy advise?)  Preferred Pharmacy (with phone number or street name): [id number] - [place], [place] - [address] [phone] (Phone) [phone] (Fax)    Agent: Please be advised that RX refills may take up to 3 business days. We ask that you follow-up with your pharmacy.

## 2018-05-29 ENCOUNTER — Other Ambulatory Visit: Payer: Self-pay | Admitting: *Deleted

## 2018-05-29 MED ORDER — ALBUTEROL SULFATE HFA 108 (90 BASE) MCG/ACT IN AERS: 2.0000 | INHALATION_SPRAY | Freq: Four times a day (QID) | RESPIRATORY_TRACT | 1 refills | Status: DC | PRN

## 2018-05-29 NOTE — Telephone Encounter (Signed)
Rx for Ventolin sent to pharmacy as requested.

## 2018-05-30 ENCOUNTER — Encounter: Payer: Self-pay | Admitting: Family Medicine

## 2018-05-30 ENCOUNTER — Ambulatory Visit (INDEPENDENT_AMBULATORY_CARE_PROVIDER_SITE_OTHER): Payer: Medicare Other | Admitting: Family Medicine

## 2018-05-30 VITALS — BP 122/78 | HR 68 | Temp 97.9°F | Resp 16 | Ht 67.5 in | Wt 165.5 lb

## 2018-05-30 DIAGNOSIS — K581 Irritable bowel syndrome with constipation: Secondary | ICD-10-CM

## 2018-05-30 DIAGNOSIS — J449 Chronic obstructive pulmonary disease, unspecified: Secondary | ICD-10-CM

## 2018-05-30 DIAGNOSIS — I10 Essential (primary) hypertension: Secondary | ICD-10-CM | POA: Diagnosis not present

## 2018-05-30 DIAGNOSIS — I739 Peripheral vascular disease, unspecified: Secondary | ICD-10-CM | POA: Diagnosis not present

## 2018-05-30 DIAGNOSIS — E871 Hypo-osmolality and hyponatremia: Secondary | ICD-10-CM | POA: Diagnosis not present

## 2018-05-30 DIAGNOSIS — G629 Polyneuropathy, unspecified: Secondary | ICD-10-CM

## 2018-05-30 LAB — SODIUM: SODIUM: 134 meq/L — AB (ref 135–145)

## 2018-05-30 MED ORDER — PRAVASTATIN SODIUM 20 MG PO TABS: 20.0000 mg | ORAL_TABLET | Freq: Every day | ORAL | 0 refills | Status: DC

## 2018-05-30 MED ORDER — GABAPENTIN 100 MG PO CAPS: 100.0000 mg | ORAL_CAPSULE | Freq: Every day | ORAL | 2 refills | Status: DC

## 2018-05-30 NOTE — Patient Instructions (Addendum)
A few things to remember from today's visit:   Chronic obstructive pulmonary disease, unspecified COPD type (HCC)  Irritable bowel syndrome with constipation  Essential hypertension  PAD (peripheral artery disease) (HCC) - Plan: pravastatin (PRAVACHOL) 20 MG tablet  Peripheral polyneuropathy - Plan: gabapentin (NEURONTIN) 100 MG capsule   Please be sure medication list is accurate. If a new problem present, please set up appointment sooner than planned today.

## 2018-05-30 NOTE — Assessment & Plan Note (Signed)
This is a chronic problem. She agrees with trying gabapentin 100 mg at bedtime, we discussed side effects. Fall precautions discussed. Follow-up in 3 months.

## 2018-05-30 NOTE — Progress Notes (Signed)
HPI:   [name] is a 72 y.o. female, who is here today with her husband for 4 months follow up.   She was last seen on 04/09/18 for acute visit.  Hypertension:  Currently on Bystolic 10 mg twice daily, losartan 100 mg daily, and hydralazine 10 mg 3 times daily.  She does not check BP at home because it causes anxiety.  She is taking medications as instructed, no side effects reported.  She has not noted unusual headache, visual changes, exertional chest pain, dyspnea,  focal weakness, or edema.   Lab Results  Component Value Date   CREATININE 0.78 01/05/2018   BUN 14 01/05/2018   NA 131 (L) 01/05/2018   K 5.0 01/05/2018   CL 96 01/05/2018   CO2 27 01/05/2018    Anxiety: Currently she is on clonazepam 0.5 mg twice daily. She has not tolerated other anxiolytic medications in the past. She denies side effects and is still helping with anxiety. Problem is exacerbated by some stress at home, husband and her 2 sons health issues.  She denies depressed mood or suicidal thoughts.    Fibromyalgia: Currently she is on Flexeril 10 mg twice daily as needed. She has not tolerated Cymbalta or gabapentin in the past. Pain is exacerbated by prolonged standing or walking, movement after prolonged sitting. Alleviated by rest.  IBS constipation: Intermittent diffuse abdominal pain,stable. Pain alleviated by defecation. She has not tolerated pharmacologic treatments in the past, they all caused diarrhea. Prune juice has helped in the past but she stopped drinking it. Last colonoscopy 11/15/2016.   Today she is c/o burning like sensation on distal LE and toes, bilateral. It is worse at night,interferring with sleep. No skin rash,erythema,or LE edema. She has seen neurologist and according to pt,she was offered "nerve block" and Topamax but she refused.   Hx of PAD, she is on Aspirin 81 mg. She has not tolerated statin meds in the past. Denies claudication  like symptoms.  Abdominal CT 03/30/17: Vascular/Lymphatic: There is moderate abdominal aortic atherosclerosis present. As noted on prior studies there is some narrowing of the origins of the celiac axis and SMA but both are patent. Narrowing of the origins of the renal artery is also appears to be present. The IMA is faintly visualized. Significant atheromatous change of the proximal common iliac artery is also is noted.   Occasional numbness,no focal weakness. She has had this problem intermittently for years, started again about 2 months ago. Hx of back pain, no radiated. + Fibromyalgia.  COPD: She is on Albuterol inh, uses it about 3 times per week for wheezing and cough. She has Albuterol neb incase she has severe symptoms,has not needed it for 6+ months.  She has not tolerated LABA-ICS inhalers. No fever or chills.    Review of Systems  Constitutional: Positive for fatigue. Negative for activity change, appetite change and fever.  HENT: Negative for mouth sores, nosebleeds and trouble swallowing.   Eyes: Negative for redness and visual disturbance.  Respiratory: Positive for wheezing. Negative for cough.   Cardiovascular: Negative for chest pain, palpitations and leg swelling.  Gastrointestinal: Negative for abdominal pain, nausea and vomiting.       Negative for changes in bowel habits.  Genitourinary: Negative for decreased urine volume, dysuria and hematuria.  Musculoskeletal: Positive for arthralgias, back pain, gait problem and myalgias.  Skin: Negative for rash and wound.  Neurological: Positive for numbness. Negative for syncope, weakness and headaches.  Psychiatric/Behavioral:  Positive for sleep disturbance. Negative for confusion. The patient is nervous/anxious.     Current Outpatient Medications on File Prior to Visit  Medication Sig Dispense Refill  . acetaminophen (TYLENOL) 500 MG tablet Take 1,000 mg by mouth every 8 (eight) hours as needed for moderate pain.      . Albuterol Sulfate (PROAIR HFA IN) Inhale 200 mcg into the lungs as needed.    Marland Kitchen aspirin 81 MG chewable tablet Chew 81 mg by mouth.    . Biotin 1000 MCG tablet Take by mouth.    . Cholecalciferol (VITAMIN D3) 2000 units TABS Take 1 tablet by mouth daily.    . clonazePAM (KLONOPIN) 0.5 MG tablet TAKE 1 TABLET BY MOUTH TWICE DAILY AT 10AM AND 10 PM 60 tablet 3  . cyclobenzaprine (FLEXERIL) 10 MG tablet TAKE 1/2 TO 1 TABLET BY MOUTH DAILY AS NEEDED FOR MUSCLE SPASMS 90 tablet 1  . hydrALAZINE (APRESOLINE) 10 MG tablet TAKE 1 TABLET(10 MG) BY MOUTH THREE TIMES DAILY 180 tablet 2  . hydrocortisone (ANUSOL-HC) 2.5 % rectal cream Place 1 application rectally 2 (two) times daily. 30 g 1  . levothyroxine (SYNTHROID, LEVOTHROID) 50 MCG tablet Take 1 tablet by mouth daily before breakfast. 90 tablet 2  . losartan (COZAAR) 100 MG tablet TAKE 1 TABLET(100 MG) BY MOUTH EVERY MORNING 90 tablet 2  . Multiple Vitamins-Minerals (ZINC PO) Take 1 capsule by mouth daily.    . Nebivolol HCl (BYSTOLIC) 20 MG TABS Take 0.5 tablets (10 mg total) by mouth 2 (two) times daily. 90 tablet 2  . OVER THE COUNTER MEDICATION Apply 2 drops to eye at bedtime as needed (for itchy dry eyes.).     No current facility-administered medications on file prior to visit.      Past Medical History:  Diagnosis Date  . Anemia   . Anxiety   . Cataract    bilateral -left > right  . Clostridium difficile diarrhea    05-09-14 denies any problems at this time  . Depression   . Fibromyalgia   . History of diverticulosis   . Hypertension   . Hypothyroidism   . IBS (irritable bowel syndrome)   . Neuromuscular disorder (HCC)    fibromyalgia  . Thyroid disease    Allergies  Allergen Reactions  . Sulfa Antibiotics Other (See Comments) and Itching    Anal itching "anal itching"  . Demerol  [Meperidine Hcl] Nausea And Vomiting  . Levofloxacin In D5w Other (See Comments)    Yeast infections.  "Yeast infections"  . Spironolactone  Other (See Comments)    Weakness/malaise "Weakness" Other reaction(s): Other (See Comments) Weakness/malaise  . Clindamycin     Other reaction(s): Other (See Comments)  . Clindamycin/Lincomycin     CDIF  . Erythromycin Diarrhea  . Erythromycin Base     OTHER REACTION(S): Diarrhea Other reaction(s): Other (See Comments) OTHER REACTION(S): Diarrhea  . Fluconazole Other (See Comments)  . Hydrochlorothiazide Other (See Comments)    Weakness/malaise Other reaction(s): Other (See Comments) Weakness/malaise  . Lincomycin Diarrhea    CDIF  . Meperidine Nausea And Vomiting, Other (See Comments) and Hypertension    Severe headache OTHER REACTION(S): Hypertension Severe headache  . Ciprofloxacin Diarrhea  . Levofloxacin Nausea Only    Yeast infection OTHER REACTION(S): Diarrhea Other reaction(s): Other (See Comments) Yeast infection  . Metronidazole Nausea Only    OTHER REACTION(S): Diarrhea OTHER REACTION(S): Diarrhea   . Other Itching    Social History   Socioeconomic History  .  Marital status: Married    Spouse name: Megan Glover  . Number of children: Not on file  . Years of education: Not on file  . Highest education level: Not on file  Occupational History  . Not on file  Social Needs  . Financial resource strain: Not on file  . Food insecurity:    Worry: Not on file    Inability: Not on file  . Transportation needs:    Medical: Not on file    Non-medical: Not on file  Tobacco Use  . Smoking status: Former Smoker    Packs/day: 1.50    Years: 25.00    Pack years: 37.50    Types: Cigarettes    Last attempt to quit: 05/09/1980    Years since quitting: 38.0  . Smokeless tobacco: Never Used  Substance and Sexual Activity  . Alcohol use: No    Alcohol/week: 0.0 standard drinks  . Drug use: No  . Sexual activity: Not Currently  Lifestyle  . Physical activity:    Days per week: Not on file    Minutes per session: Not on file  . Stress: Not on file    Relationships  . Social connections:    Talks on phone: Not on file    Gets together: Not on file    Attends religious service: Not on file    Active member of club or organization: Not on file    Attends meetings of clubs or organizations: Not on file    Relationship status: Not on file  Other Topics Concern  . Not on file  Social History Narrative  . Not on file    Vitals:   05/30/18 1405  BP: 122/78  Pulse: 68  Resp: 16  Temp: 97.9 F (36.6 C)  SpO2: 95%   Body mass index is 25.54 kg/m.  Physical Exam  Nursing note and vitals reviewed. Constitutional: She is oriented to person, place, and time. She appears well-developed. No distress.  HENT:  Head: Normocephalic and atraumatic.  Mouth/Throat: Oropharynx is clear and moist and mucous membranes are normal.  Eyes: Pupils are equal, round, and reactive to light. Conjunctivae are normal.  Cardiovascular: Normal rate and regular rhythm.  No murmur heard. DP and PT pulses present bilateral.  Respiratory: Effort normal and breath sounds normal. No respiratory distress.  GI: Soft. She exhibits no mass. There is no hepatomegaly. There is no tenderness.  Musculoskeletal: She exhibits no edema.       Thoracic back: She exhibits tenderness. She exhibits no bony tenderness.       Lumbar back: She exhibits tenderness. She exhibits no bony tenderness.  Lymphadenopathy:    She has no cervical adenopathy.  Neurological: She is alert and oriented to person, place, and time. She has normal strength. No cranial nerve deficit.  Unstable gait assisted with a cane.  Skin: Skin is warm. No rash noted. No erythema.  Psychiatric: Her mood appears anxious.  Well groomed, good eye contact.     ASSESSMENT AND PLAN:   [name] was seen today for 4 months follow-up.  Diagnoses and all orders for this visit:  Peripheral neuropathy This is a chronic problem. She agrees with trying gabapentin 100 mg at bedtime, we  discussed side effects. Fall precautions discussed. Follow-up in 3 months.  PAD (peripheral artery disease) (HCC)  Palpable pulses. Continue Aspirin 81 mg daily. Benefits of statin meds discussed, she agrees with trying Pravastatin 20 mg daily. Instructed about warning signs.  -  Pravastatin (PRAVACHOL) 20 MG tablet; Take 1 tablet (20 mg total) by mouth daily.  Chronic obstructive pulmonary disease, unspecified COPD type (HCC) Stable. She has not tolerated other inhalers,so no changes. Side effects of Albuterol discussed.  Irritable bowel syndrome with constipation Recommend resuming prune juice 2 oz daily. Adequate fiber and fluid intake. Colonoscopy 11/2016.   Essential hypertension Adequately controlled. No changes in current management. Low salt diet recommended. Eye exam recommended annually. F/U in 6 months, before if needed.  Hyponatremia Further recommendations will be given according to lab results.  -     Sodium       Dima Mini G. [place], MD  [facility]. [place] office.

## 2018-06-01 ENCOUNTER — Other Ambulatory Visit: Payer: Self-pay | Admitting: *Deleted

## 2018-06-01 ENCOUNTER — Encounter: Payer: Self-pay | Admitting: Family Medicine

## 2018-06-01 DIAGNOSIS — G629 Polyneuropathy, unspecified: Secondary | ICD-10-CM

## 2018-06-01 DIAGNOSIS — I739 Peripheral vascular disease, unspecified: Secondary | ICD-10-CM

## 2018-06-01 MED ORDER — PRAVASTATIN SODIUM 20 MG PO TABS: 20.0000 mg | ORAL_TABLET | Freq: Every day | ORAL | 0 refills | Status: DC

## 2018-06-01 MED ORDER — GABAPENTIN 100 MG PO CAPS: 100.0000 mg | ORAL_CAPSULE | Freq: Every day | ORAL | 2 refills | Status: DC

## 2018-06-02 ENCOUNTER — Encounter: Payer: Self-pay | Admitting: Family Medicine

## 2018-06-02 ENCOUNTER — Other Ambulatory Visit: Payer: Self-pay | Admitting: Family Medicine

## 2018-06-02 DIAGNOSIS — I10 Essential (primary) hypertension: Secondary | ICD-10-CM

## 2018-06-04 ENCOUNTER — Encounter: Payer: Self-pay | Admitting: Family Medicine

## 2018-06-10 ENCOUNTER — Encounter: Payer: Self-pay | Admitting: Family Medicine

## 2018-06-11 ENCOUNTER — Other Ambulatory Visit: Payer: Self-pay | Admitting: Family Medicine

## 2018-06-11 DIAGNOSIS — F419 Anxiety disorder, unspecified: Secondary | ICD-10-CM

## 2018-06-25 ENCOUNTER — Other Ambulatory Visit: Payer: Self-pay | Admitting: Family Medicine

## 2018-07-02 ENCOUNTER — Other Ambulatory Visit: Payer: Self-pay

## 2018-07-12 ENCOUNTER — Other Ambulatory Visit: Payer: Self-pay | Admitting: Family Medicine

## 2018-07-12 DIAGNOSIS — I10 Essential (primary) hypertension: Secondary | ICD-10-CM

## 2018-07-18 ENCOUNTER — Other Ambulatory Visit: Payer: Self-pay | Admitting: *Deleted

## 2018-07-18 DIAGNOSIS — I10 Essential (primary) hypertension: Secondary | ICD-10-CM

## 2018-07-18 MED ORDER — NEBIVOLOL HCL 20 MG PO TABS: 0.5000 | ORAL_TABLET | Freq: Two times a day (BID) | ORAL | 0 refills | Status: DC

## 2018-07-31 ENCOUNTER — Emergency Department (INDEPENDENT_AMBULATORY_CARE_PROVIDER_SITE_OTHER)
Admission: EM | Admit: 2018-07-31 | Discharge: 2018-07-31 | Disposition: A | Discharge status: Home / Self Care | Payer: Medicare Other | Source: Home / Self Care

## 2018-07-31 ENCOUNTER — Other Ambulatory Visit: Payer: Self-pay

## 2018-07-31 ENCOUNTER — Encounter: Payer: Self-pay | Admitting: *Deleted

## 2018-07-31 DIAGNOSIS — H00015 Hordeolum externum left lower eyelid: Secondary | ICD-10-CM

## 2018-07-31 MED ORDER — DOXYCYCLINE HYCLATE 100 MG PO CAPS: 100.0000 mg | ORAL_CAPSULE | Freq: Two times a day (BID) | ORAL | 0 refills | Status: DC

## 2018-07-31 MED ORDER — OFLOXACIN 0.3 % OP SOLN: 1.0000 [drp] | Freq: Four times a day (QID) | OPHTHALMIC | 0 refills | Status: DC

## 2018-07-31 NOTE — Discharge Instructions (Addendum)
Use the ofloxacin eyedrops 1 drop in the left eye 4 times daily.  Take doxycycline 100 mg 1 twice daily for 1 week.  Take with food at breakfast and dinner, but try to avoid taking it with dairy products.  If problem gets worse see your ophthalmologist or contact the office here if needed for a referral.

## 2018-07-31 NOTE — ED Triage Notes (Signed)
Pt c/o stye on her LT eye x 3 wks.

## 2018-07-31 NOTE — ED Provider Notes (Signed)
[facility]    CSN: [id number] Arrival date & time: 07/31/18  1300     History   Chief Complaint Chief Complaint  Patient presents with  . Stye    HPI Megan Glover is a 72 y.o. female.   HPI Patient has been having a stye on her left lower eyelid for the past week or 2.  She has tried various homeopathic remedies and it is coming on and is gradually getting worse.  She complains of little puffiness of the skin of the lower orbit. Past Medical History:  Diagnosis Date  . Anemia   . Anxiety   . Cataract    bilateral -left > right  . Clostridium difficile diarrhea    05-09-14 denies any problems at this time  . Depression   . Fibromyalgia   . History of diverticulosis   . Hypertension   . Hypothyroidism   . IBS (irritable bowel syndrome)   . Neuromuscular disorder (HCC)    fibromyalgia  . Thyroid disease     Patient Active Problem List   Diagnosis Date Noted  . Peripheral neuropathy 05/30/2018  . Insomnia 07/28/2017  . Back pain with left-sided radiculopathy 03/20/2017  . Atrophic vaginitis 11/17/2016  . Osteoporosis 11/17/2016  . COPD (chronic obstructive pulmonary disease) (HCC) 11/16/2016  . PAD (peripheral artery disease) (HCC) 11/23/2015  . Vitamin D deficiency 11/23/2015  . Generalized anxiety disorder 02/26/2014  . Hypothyroidism 02/25/2014  . Essential hypertension 02/20/2014  . Hypertensive urgency 09/12/2013  . Fibromyalgia   . Irritable bowel syndrome with constipation     Past Surgical History:  Procedure Laterality Date  . Cataract removal 2015 Bilateral   . DILATION AND CURETTAGE OF UTERUS    . INGUINAL HERNIA REPAIR Bilateral 05/16/2014   Procedure: LAPAROSCOPIC EXPLORATION AND REPAIR OF BILATERAL FEMEROL AND BILATERAL INGUINAL HERNIAS  WITH MESH;  Surgeon: Karie Soda, MD;  Location: [facility];  Service: General;  Laterality: Bilateral;  . INTRAMEDULLARY (IM) NAIL INTERTROCHANTERIC Right 02/21/2014   Procedure:  INTRAMEDULLARY (IM) NAIL INTERTROCHANTRIC;  Surgeon: Eulas Post, MD;  Location: [facility];  Service: Orthopedics;  Laterality: Right;    OB History   No obstetric history on file.      Home Medications    Prior to Admission medications   Medication Sig Start Date End Date Taking? Authorizing Provider  acetaminophen (TYLENOL) 500 MG tablet Take 1,000 mg by mouth every 8 (eight) hours as needed for moderate pain.    [provider]  Albuterol Sulfate (PROAIR HFA IN) Inhale 200 mcg into the lungs as needed.    [provider]  aspirin 81 MG chewable tablet Chew 81 mg by mouth.    [provider]  Biotin 1000 MCG tablet Take by mouth.    [provider]  Cholecalciferol (VITAMIN D3) 2000 units TABS Take 1 tablet by mouth daily.    [provider]  clonazePAM (KLONOPIN) 0.5 MG tablet TAKE 1 TABLET BY MOUTH TWICE DAILY AT 10 AM AND AT 10 PM 06/11/18   [place], Betty G, MD  cyclobenzaprine (FLEXERIL) 10 MG tablet TAKE 1/2 TO 1 TABLET BY MOUTH DAILY AS NEEDED FOR MUSCLE SPASMS 09/05/17   [place], Betty G, MD  doxycycline (VIBRAMYCIN) 100 MG capsule Take 1 capsule (100 mg total) by mouth 2 (two) times daily. 07/31/18   Peyton Najjar, MD  gabapentin (NEURONTIN) 100 MG capsule Take 1 capsule (100 mg total) by mouth at bedtime. 06/01/18   [place], Betty G,  MD  hydrALAZINE (APRESOLINE) 10 MG tablet TAKE 1 TABLET(10 MG) BY MOUTH THREE TIMES DAILY 02/25/18   [place]Jordan, Betty G, MD  hydrocortisone (ANUSOL-HC) 2.5 % rectal cream Place 1 application rectally 2 (two) times daily. 09/27/16   [place], Betty G, MD  levothyroxine (SYNTHROID, LEVOTHROID) 50 MCG tablet Take 1 tablet by mouth daily before breakfast. 03/28/18   [place], Betty G, MD  levothyroxine (SYNTHROID, LEVOTHROID) 50 MCG tablet TAKE 1 TABLET(50 MCG) BY MOUTH DAILY BEFORE BREAKFAST 06/25/18   [place], Betty G, MD  losartan (COZAAR) 100 MG tablet TAKE 1 TABLET(100 MG) BY MOUTH EVERY MORNING 01/16/18   [place],  Betty G, MD  Nebivolol HCl (BYSTOLIC) 20 MG TABS Take 0.5 tablets (10 mg total) by mouth 2 (two) times daily. 07/18/18   [place], Betty G, MD  ofloxacin (OCUFLOX) 0.3 % ophthalmic solution Place 1 drop into the left eye 4 (four) times daily. 07/31/18   Peyton NajjarHopper, Wesson Stith H, MD  OVER THE COUNTER MEDICATION Apply 2 drops to eye at bedtime as needed (for itchy dry eyes.).    [provider]  pravastatin (PRAVACHOL) 20 MG tablet Take 1 tablet (20 mg total) by mouth daily. 06/01/18   [place], Betty G, MD    Family History Family History  Problem Relation Age of Onset  . Breast cancer Mother        breast CA  . Diabetes Father   . Hypertension Father   . Colon cancer Other   . Breast cancer Sister   . Breast cancer Sister   . Cancer Sister        breast  . Cancer Sister        breast    Social History Social History   Tobacco Use  . Smoking status: Former Smoker    Packs/day: 1.50    Years: 25.00    Pack years: 37.50    Types: Cigarettes    Last attempt to quit: 05/09/1980    Years since quitting: 38.2  . Smokeless tobacco: Never Used  Substance Use Topics  . Alcohol use: No    Alcohol/week: 0.0 standard drinks  . Drug use: No     Allergies   Sulfa antibiotics; Demerol  [meperidine hcl]; Levofloxacin in d5w; Spironolactone; Clindamycin; Clindamycin/lincomycin; Erythromycin; Erythromycin base; Fluconazole; Hydrochlorothiazide; Lincomycin; Meperidine; Ciprofloxacin; Levofloxacin; Metronidazole; and Other   Review of Systems Review of Systems   Physical Exam Triage Vital Signs ED Triage Vitals  Enc Vitals Group     BP 07/31/18 1323 (!) 200/93     Pulse Rate 07/31/18 1323 65     Resp 07/31/18 1323 18     Temp 07/31/18 1323 97.6 F (36.4 C)     Temp Source 07/31/18 1323 Oral     SpO2 07/31/18 1323 98 %     Weight 07/31/18 1324 168 lb (76.2 kg)     Height --      Head Circumference --      Peak Flow --      Pain Score 07/31/18 1324 8     Pain Loc --      Pain  Edu? --      Excl. in GC? --    No data found.  Updated Vital Signs BP (!) 193/80 (BP Location: Right Arm)   Pulse 65   Temp 97.6 F (36.4 C) (Oral)   Resp 18   Wt 76.2 kg   SpO2 98%   BMI 25.92 kg/m   Visual Acuity Right Eye Distance:  Left Eye Distance:   Bilateral Distance:    Right Eye Near:   Left Eye Near:    Bilateral Near:     Physical Exam Eyes:      Eyes are PERRLA.  Fundi benign.  Shimmer of intraocular lens implants is visible.  Stye is present on the midportion of the lower lid.  It looks like a little pustular area may be trying to come to ahead on the inner portion of the lid.  Is not ready to be drained yet at this time. UC Treatments / Results  Labs (all labs ordered are listed, but only abnormal results are displayed) Labs Reviewed - No data to display  EKG None  Radiology No results found.  Procedures Procedures (including critical care time)  Medications Ordered in UC Medications - No data to display  Initial Impression / Assessment and Plan / UC Course  I have reviewed the triage vital signs and the nursing notes.  Pertinent labs & imaging results that were available during my care of the patient were reviewed by me and considered in my medical decision making (see chart for details).     Patient with a stye of her left lower lid, lots of allergies.  Will treat with oral and eyedrops.  She has noticed a little puffiness of her skin of the lower orbit secondary to this. Final Clinical Impressions(s) / UC Diagnoses   Final diagnoses:  Hordeolum externum of left lower eyelid     Discharge Instructions     Use the ofloxacin eyedrops 1 drop in the left eye 4 times daily.  Take doxycycline 100 mg 1 twice daily for 1 week.  Take with food at breakfast and dinner, but try to avoid taking it with dairy products.  If problem gets worse see your ophthalmologist or contact the office here if needed for a referral.    ED Prescriptions     Medication Sig Dispense Auth. Provider   doxycycline (VIBRAMYCIN) 100 MG capsule Take 1 capsule (100 mg total) by mouth 2 (two) times daily. 14 capsule Peyton Najjar, MD   ofloxacin (OCUFLOX) 0.3 % ophthalmic solution Place 1 drop into the left eye 4 (four) times daily. 5 mL Peyton Najjar, MD     Controlled Substance Prescriptions [place] Controlled Substance Registry consulted? No   Peyton Najjar, MD 07/31/18 1355

## 2018-08-23 ENCOUNTER — Encounter: Payer: Self-pay | Admitting: Family Medicine

## 2018-08-27 ENCOUNTER — Other Ambulatory Visit: Payer: Self-pay | Admitting: Family Medicine

## 2018-08-27 ENCOUNTER — Encounter: Payer: Self-pay | Admitting: Family Medicine

## 2018-08-27 DIAGNOSIS — K644 Residual hemorrhoidal skin tags: Secondary | ICD-10-CM

## 2018-08-27 MED ORDER — HYDROCORTISONE 2.5 % RE CREA: 1.0000 "application " | TOPICAL_CREAM | Freq: Two times a day (BID) | RECTAL | 1 refills | Status: DC

## 2018-08-27 NOTE — Telephone Encounter (Signed)
Copied from CRM [id number]. Topic: Quick Communication - Rx Refill/Question >> Aug 27, 2018 11:25 AM Baldo Daub L wrote: Medication: hydrocortisone (ANUSOL-HC) 2.5 % rectal cream  Has the patient contacted their pharmacy? Yes - states they haven't heard from [place] (Agent: If no, request that the patient contact the pharmacy for the refill.) (Agent: If yes, when and what did the pharmacy advise?)  Preferred Pharmacy (with phone number or street name): [id number] - [place], [place] - [address] [place] [address] [phone] (Phone) [phone] (Fax)  Agent: Please be advised that RX refills may take up to 3 business days. We ask that you follow-up with your pharmacy.

## 2018-08-31 ENCOUNTER — Other Ambulatory Visit: Payer: Self-pay | Admitting: Family Medicine

## 2018-09-10 ENCOUNTER — Encounter: Payer: Self-pay | Admitting: Family Medicine

## 2018-09-10 LAB — HM MAMMOGRAPHY

## 2018-09-11 ENCOUNTER — Encounter: Payer: Self-pay | Admitting: Family Medicine

## 2018-09-28 ENCOUNTER — Encounter: Payer: Self-pay | Admitting: Family Medicine

## 2018-09-28 ENCOUNTER — Ambulatory Visit (INDEPENDENT_AMBULATORY_CARE_PROVIDER_SITE_OTHER): Payer: Medicare Other | Admitting: Family Medicine

## 2018-09-28 VITALS — BP 125/77 | HR 54 | Temp 97.7°F | Resp 16 | Ht 67.5 in | Wt 168.1 lb

## 2018-09-28 DIAGNOSIS — K581 Irritable bowel syndrome with constipation: Secondary | ICD-10-CM

## 2018-09-28 DIAGNOSIS — F411 Generalized anxiety disorder: Secondary | ICD-10-CM | POA: Diagnosis not present

## 2018-09-28 DIAGNOSIS — I10 Essential (primary) hypertension: Secondary | ICD-10-CM | POA: Diagnosis not present

## 2018-09-28 DIAGNOSIS — H00012 Hordeolum externum right lower eyelid: Secondary | ICD-10-CM

## 2018-09-28 DIAGNOSIS — K644 Residual hemorrhoidal skin tags: Secondary | ICD-10-CM | POA: Diagnosis not present

## 2018-09-28 DIAGNOSIS — G629 Polyneuropathy, unspecified: Secondary | ICD-10-CM

## 2018-09-28 DIAGNOSIS — R001 Bradycardia, unspecified: Secondary | ICD-10-CM

## 2018-09-28 DIAGNOSIS — E038 Other specified hypothyroidism: Secondary | ICD-10-CM

## 2018-09-28 DIAGNOSIS — E559 Vitamin D deficiency, unspecified: Secondary | ICD-10-CM | POA: Diagnosis not present

## 2018-09-28 DIAGNOSIS — E871 Hypo-osmolality and hyponatremia: Secondary | ICD-10-CM

## 2018-09-28 DIAGNOSIS — M5410 Radiculopathy, site unspecified: Secondary | ICD-10-CM

## 2018-09-28 LAB — COMPREHENSIVE METABOLIC PANEL
ALBUMIN: 4.4 g/dL (ref 3.5–5.2)
ALK PHOS: 57 U/L (ref 39–117)
ALT: 16 U/L (ref 0–35)
AST: 22 U/L (ref 0–37)
BILIRUBIN TOTAL: 0.5 mg/dL (ref 0.2–1.2)
BUN: 15 mg/dL (ref 6–23)
CO2: 28 mEq/L (ref 19–32)
CREATININE: 0.79 mg/dL (ref 0.40–1.20)
Calcium: 10 mg/dL (ref 8.4–10.5)
Chloride: 100 mEq/L (ref 96–112)
GFR: 71.26 mL/min (ref >60.00)
Glucose, Bld: 90 mg/dL (ref 70–99)
Potassium: 5.1 mEq/L (ref 3.5–5.1)
SODIUM: 135 meq/L (ref 135–145)
TOTAL PROTEIN: 7.1 g/dL (ref 6.0–8.3)

## 2018-09-28 LAB — VITAMIN D 25 HYDROXY (VIT D DEFICIENCY, FRACTURES): VITD: 39.08 ng/mL (ref 30.00–100.00)

## 2018-09-28 LAB — TSH: TSH: 1.94 u[IU]/mL (ref 0.35–4.50)

## 2018-09-28 MED ORDER — ERYTHROMYCIN 5 MG/GM OP OINT: 1.0000 "application " | TOPICAL_OINTMENT | Freq: Every day | OPHTHALMIC | 0 refills | Status: AC

## 2018-09-28 NOTE — Assessment & Plan Note (Signed)
Asymptomatic at this time. Try to avoid constipation/straining. No changes in Anusol cream. Instructed about warning signs.

## 2018-09-28 NOTE — Progress Notes (Signed)
HPI:   Ms.Keishawna Wynonia SoursLouise Wyly is a 73 y.o. female, who is here today for 4 months follow up, she has multiple concerns today.  She was last seen on 05/30/2018.   Hypertension: Currently she is on hydralazine 10 mg 3 times daily, Bystolic 20 mg 0.5 tablet twice daily, and losartan 100 mg daily. She does not check BP at home because it causes anxiety. Denies severe/f, requent headache, visual changes, chest pain, dyspnea, palpitation, claudication, focal weakness, or edema.  Mild hypoNa++, 131 in 12/2017.  Lab Results  Component Value Date   CREATININE 0.78 01/05/2018   BUN 14 01/05/2018   NA 134 (L) 05/30/2018   K 5.0 01/05/2018   CL 96 01/05/2018   CO2 27 01/05/2018   She sometimes add salt to food/salads.  Mild bradycardia today. She has tried many antihypertensive medications and not well tolerated. Bystolic is one of few she has tolerated well.   Vitamin D deficiency: Currently she is on OTC vitamin D 2000 units daily. Last 25 [place] vitamin D was 38 in 05/2016.  She having difficulty losing weight, she thinks gabapentin is contributing for this problem. She is taking gabapentin to treat restless leg syndrome, peripheral neuropathy, and radicular pain left lower extremity. Feet burning sensation at night when in bed and intermittent numbness of lower extremities, L>R during the day.  Numbness is exacerbated by certain movements. Left lower back radiated to left lower extremity. Problem is not as bad as it used to be.  Constipation: OTC stool softener seems to help but she is afraid of taking a daily. If she does not take anything she may have bowel movements every 3 days. Prune juice 4 ounces is not longer helping and 8 ounces cause diarrhea.  She has requested refills on Anusol 2.5% rectal cream.  She reported history of external hemorrhoids, she uses it daily as needed. Hard stool, she has to strain. Dyschezia and occasionally hematochezia.  This is a  chronic problem, she has follow with GI and Anusol was recommended.  Hypothyroidism: She is on levothyroxine 50 mcg daily. No changes in fatigue. Negative for heat/cold intolerance or changes in bowel habits. Stable hand tremor.  Lab Results  Component Value Date   TSH 2.17 01/05/2018    -A few days ago she started with local erythema and tenderness on lower eyelid right eye. She has been applying local heat. No history of trauma, no conjunctival erythema, no visual changes.  -For the past few days rhinorrhea and post nasal drainage. She is not taking OTC medication. Frequent sneezing. She has not identified exacerbating or alleviating factors. No fever or chills. Hx of allergic rhinitis, Flonase nasal spray caused bleeding.    Review of Systems  Constitutional: Positive for fatigue. Negative for activity change, appetite change and fever.  HENT: Negative for mouth sores, nosebleeds and trouble swallowing.   Eyes: Negative for redness and visual disturbance.  Respiratory: Negative for cough, shortness of breath and wheezing.   Cardiovascular: Negative for chest pain, palpitations and leg swelling.  Gastrointestinal: Positive for abdominal pain and constipation. Negative for nausea and vomiting.       Negative for changes in bowel habits.  Endocrine: Negative for cold intolerance and heat intolerance.  Genitourinary: Negative for decreased urine volume, dysuria and hematuria.  Musculoskeletal: Positive for arthralgias, back pain, gait problem and myalgias.  Skin: Negative for rash and wound.  Allergic/Immunologic: Positive for environmental allergies.  Neurological: Positive for numbness. Negative for syncope, weakness  and headaches.  Hematological: Negative for adenopathy. Does not bruise/bleed easily.  Psychiatric/Behavioral: Positive for sleep disturbance. Negative for confusion. The patient is nervous/anxious.      Current Outpatient Medications on File Prior to Visit    Medication Sig Dispense Refill  . acetaminophen (TYLENOL) 500 MG tablet Take 1,000 mg by mouth every 8 (eight) hours as needed for moderate pain.    . Albuterol Sulfate (PROAIR HFA IN) Inhale 200 mcg into the lungs as needed.    Marland Kitchen aspirin 81 MG chewable tablet Chew 81 mg by mouth.    . Biotin 1000 MCG tablet Take by mouth.    . Cholecalciferol (VITAMIN D3) 2000 units TABS Take 1 tablet by mouth daily.    . clonazePAM (KLONOPIN) 0.5 MG tablet TAKE 1 TABLET BY MOUTH TWICE DAILY AT 10 AM AND AT 10 PM 60 tablet 3  . cyclobenzaprine (FLEXERIL) 10 MG tablet TAKE 1/2 TO 1 TABLET BY MOUTH DAILY AS NEEDED FOR MUSCLE SPASMS 90 tablet 1  . gabapentin (NEURONTIN) 100 MG capsule Take 1 capsule (100 mg total) by mouth at bedtime. 30 capsule 2  . hydrALAZINE (APRESOLINE) 10 MG tablet TAKE 1 TABLET(10 MG) BY MOUTH THREE TIMES DAILY 180 tablet 2  . hydrocortisone (ANUSOL-HC) 2.5 % rectal cream Place 1 application rectally 2 (two) times daily. 30 g 1  . levothyroxine (SYNTHROID, LEVOTHROID) 50 MCG tablet TAKE 1 TABLET(50 MCG) BY MOUTH DAILY BEFORE BREAKFAST 90 tablet 0  . losartan (COZAAR) 100 MG tablet TAKE 1 TABLET(100 MG) BY MOUTH EVERY MORNING 90 tablet 2  . Nebivolol HCl (BYSTOLIC) 20 MG TABS Take 0.5 tablets (10 mg total) by mouth 2 (two) times daily. 90 tablet 0  . OVER THE COUNTER MEDICATION Apply 2 drops to eye at bedtime as needed (for itchy dry eyes.).    Marland Kitchen pravastatin (PRAVACHOL) 20 MG tablet Take 1 tablet (20 mg total) by mouth daily. 90 tablet 0   No current facility-administered medications on file prior to visit.      Past Medical History:  Diagnosis Date  . Anemia   . Anxiety   . Cataract    bilateral -left > right  . Clostridium difficile diarrhea    05-09-14 denies any problems at this time  . Depression   . Fibromyalgia   . History of diverticulosis   . Hypertension   . Hypothyroidism   . IBS (irritable bowel syndrome)   . Neuromuscular disorder (HCC)    fibromyalgia  .  Thyroid disease    Allergies  Allergen Reactions  . Sulfa Antibiotics Other (See Comments) and Itching    Anal itching "anal itching"  . Demerol  [Meperidine Hcl] Nausea And Vomiting  . Levofloxacin In D5w Other (See Comments)    Yeast infections.  "Yeast infections"  . Spironolactone Other (See Comments)    Weakness/malaise "Weakness" Other reaction(s): Other (See Comments) Weakness/malaise  . Clindamycin     Other reaction(s): Other (See Comments)  . Clindamycin/Lincomycin     CDIF  . Erythromycin Diarrhea  . Erythromycin Base     OTHER REACTION(S): Diarrhea Other reaction(s): Other (See Comments) OTHER REACTION(S): Diarrhea  . Fluconazole Other (See Comments)  . Hydrochlorothiazide Other (See Comments)    Weakness/malaise Other reaction(s): Other (See Comments) Weakness/malaise  . Lincomycin Diarrhea    CDIF  . Meperidine Nausea And Vomiting, Other (See Comments) and Hypertension    Severe headache OTHER REACTION(S): Hypertension Severe headache  . Ciprofloxacin Diarrhea  . Levofloxacin Nausea Only  Yeast infection OTHER REACTION(S): Diarrhea Other reaction(s): Other (See Comments) Yeast infection  . Metronidazole Nausea Only    OTHER REACTION(S): Diarrhea OTHER REACTION(S): Diarrhea   . Other Itching   Past Surgical History:  Procedure Laterality Date  . Cataract removal 2015 Bilateral   . DILATION AND CURETTAGE OF UTERUS    . INGUINAL HERNIA REPAIR Bilateral 05/16/2014   Procedure: LAPAROSCOPIC EXPLORATION AND REPAIR OF BILATERAL FEMEROL AND BILATERAL INGUINAL HERNIAS  WITH MESH;  Surgeon: Karie Soda, MD;  Location: [facility];  Service: General;  Laterality: Bilateral;  . INTRAMEDULLARY (IM) NAIL INTERTROCHANTERIC Right 02/21/2014   Procedure: INTRAMEDULLARY (IM) NAIL INTERTROCHANTRIC;  Surgeon: Eulas Post, MD;  Location: [facility];  Service: Orthopedics;  Laterality: Right;    Family History  Problem Relation Age of Onset  . Breast cancer Mother         breast CA  . Diabetes Father   . Hypertension Father   . Colon cancer Other   . Breast cancer Sister   . Breast cancer Sister   . Cancer Sister        breast  . Cancer Sister        breast     Social History   Socioeconomic History  . Marital status: Married    Spouse name: Ethel Strouth  . Number of children: Not on file  . Years of education: Not on file  . Highest education level: Not on file  Occupational History  . Not on file  Social Needs  . Financial resource strain: Not on file  . Food insecurity:    Worry: Not on file    Inability: Not on file  . Transportation needs:    Medical: Not on file    Non-medical: Not on file  Tobacco Use  . Smoking status: Former Smoker    Packs/day: 1.50    Years: 25.00    Pack years: 37.50    Types: Cigarettes    Last attempt to quit: 05/09/1980    Years since quitting: 38.4  . Smokeless tobacco: Never Used  Substance and Sexual Activity  . Alcohol use: No    Alcohol/week: 0.0 standard drinks  . Drug use: No  . Sexual activity: Not Currently  Lifestyle  . Physical activity:    Days per week: Not on file    Minutes per session: Not on file  . Stress: Not on file  Relationships  . Social connections:    Talks on phone: Not on file    Gets together: Not on file    Attends religious service: Not on file    Active member of club or organization: Not on file    Attends meetings of clubs or organizations: Not on file    Relationship status: Not on file  Other Topics Concern  . Not on file  Social History Narrative  . Not on file    Vitals:   09/28/18 1410  BP: 125/77  Pulse: (!) 54  Resp: 16  Temp: 97.7 F (36.5 C)  SpO2: 98%   Body mass index is 25.94 kg/m.   Physical Exam  Nursing note and vitals reviewed. Constitutional: She is oriented to person, place, and time. She appears well-developed and well-nourished. No distress.  HENT:  Head: Normocephalic and atraumatic.  Nose: Rhinorrhea present. Right  sinus exhibits no maxillary sinus tenderness and no frontal sinus tenderness. Left sinus exhibits no maxillary sinus tenderness and no frontal sinus tenderness.  Mouth/Throat: Oropharynx is clear and moist  and mucous membranes are normal.  Eyes: Pupils are equal, round, and reactive to light. Conjunctivae are normal.    Cardiovascular: Normal rate and regular rhythm.  No murmur heard. Pulses:      Dorsalis pedis pulses are 2+ on the right side and 2+ on the left side.  HR 56/min by my count.  Respiratory: Effort normal and breath sounds normal. No respiratory distress.  GI: Soft. She exhibits no mass. There is no hepatomegaly. There is no abdominal tenderness.  Musculoskeletal:        General: No edema.     Lumbar back: She exhibits no tenderness and no bony tenderness.  Lymphadenopathy:    She has no cervical adenopathy.  Neurological: She is alert and oriented to person, place, and time. She has normal strength. No cranial nerve deficit. Gait abnormal.  Unstable gait assisted by a cane.  Skin: Skin is warm. No rash noted. No erythema.  Psychiatric: Her mood appears anxious.  Well groomed, good eye contact.     ASSESSMENT AND PLAN:   Ms. Raylie Louise Cranston was seen Myrtie Hawktoday for 3 months follow-up.  Orders Placed This Encounter  Procedures  . Comprehensive metabolic panel  . VITAMIN D 25 Hydroxy (Vit-D Deficiency, Fractures)  . TSH   Lab Results  Component Value Date   TSH 1.94 09/28/2018   Lab Results  Component Value Date   ALT 16 09/28/2018   AST 22 09/28/2018   ALKPHOS 57 09/28/2018   BILITOT 0.5 09/28/2018   Lab Results  Component Value Date   CREATININE 0.79 09/28/2018   BUN 15 09/28/2018   NA 135 09/28/2018   K 5.1 09/28/2018   CL 100 09/28/2018   CO2 28 09/28/2018     Sinus bradycardia: Mild. Asymptomatic. She is on Bystolic for BP control, because she has not tolerated many antihypertensive medications, for now no changes in Bystolic. We will  continue following.  Irritable bowel syndrome with constipation OTC stool softener seems to help, she can continue taking a daily as needed. Adequate hydration and fiber intake. Instructed about warning signs.  Generalized anxiety disorder Stable. No changes in clonazepam 0.5 mg twice daily. We had discussed side effects of medication.  Hypothyroidism No changes in current management, will follow labs done today and will give further recommendations accordingly.   Hyponatremia We discussed possible etiologies. Fluid restriction. Further recommendation will be given according to BMP results.  Essential hypertension BP adequately controlled. No changes in current management. Recommend low-salt diet. Eye exam annually. Follow-up in 5 months.  Back pain with left-sided radiculopathy Otherwise stable. Continue gabapentin 100 mg daily. Fall precautions.  External hemorrhoid Asymptomatic at this time. Try to avoid constipation/straining. No changes in Anusol cream. Instructed about warning signs.  Peripheral neuropathy We discussed side effects of gabapentin, she would like to continue it. Good foot care. Fall precautions  Hyponatremia Mild. Some of meds can aggravate problem. Fluid restriction,mild. Further recommendations according to lab results.  Hordeolum externum of right lower eyelid Educated about Dx. Explained that it is usually sterile, still recommend abx oint at night x 7 days. Local heat a few times during the day. F/U if not resolved in 2-3 weeks.    Return in about 5 months (around 02/26/2019) for f/u.      Betty G. [place]Jordan, MD  [facility]. [place] office.

## 2018-09-28 NOTE — Assessment & Plan Note (Signed)
No changes in current management, will follow labs done today and will give further recommendations accordingly.  

## 2018-09-28 NOTE — Assessment & Plan Note (Signed)
Otherwise stable. Continue gabapentin 100 mg daily. Fall precautions.

## 2018-09-28 NOTE — Assessment & Plan Note (Signed)
We discussed possible etiologies. Fluid restriction. Further recommendation will be given according to BMP results.

## 2018-09-28 NOTE — Assessment & Plan Note (Signed)
Stable. No changes in clonazepam 0.5 mg twice daily. We had discussed side effects of medication.

## 2018-09-28 NOTE — Assessment & Plan Note (Signed)
BP adequately controlled. No changes in current management. Recommend low-salt diet. Eye exam annually. Follow-up in 5 months.

## 2018-09-28 NOTE — Patient Instructions (Addendum)
A few things to remember from today's visit:   Essential hypertension  Generalized anxiety disorder  Hyponatremia  External hemorrhoid  Vitamin D deficiency  Insomnia, unspecified type  Other specified hypothyroidism  Irritable bowel syndrome with constipation  Back pain with left-sided radiculopathy  Peripheral polyneuropathy  No changes in your medications today. I will see you back in 4-5 months.   Please be sure medication list is accurate. If a new problem present, please set up appointment sooner than planned today.

## 2018-09-28 NOTE — Assessment & Plan Note (Signed)
We discussed side effects of gabapentin, she would like to continue it. Good foot care. Fall precautions

## 2018-09-28 NOTE — Assessment & Plan Note (Signed)
OTC stool softener seems to help, she can continue taking a daily as needed. Adequate hydration and fiber intake. Instructed about warning signs.

## 2018-09-30 ENCOUNTER — Encounter: Payer: Self-pay | Admitting: Family Medicine

## 2018-10-05 ENCOUNTER — Other Ambulatory Visit: Payer: Self-pay | Admitting: Family Medicine

## 2018-10-05 DIAGNOSIS — F419 Anxiety disorder, unspecified: Secondary | ICD-10-CM

## 2018-10-07 ENCOUNTER — Other Ambulatory Visit: Payer: Self-pay | Admitting: Family Medicine

## 2018-10-07 DIAGNOSIS — I10 Essential (primary) hypertension: Secondary | ICD-10-CM

## 2018-10-16 ENCOUNTER — Other Ambulatory Visit: Payer: Self-pay | Admitting: Family Medicine

## 2018-10-27 ENCOUNTER — Other Ambulatory Visit: Payer: Self-pay | Admitting: Family Medicine

## 2018-10-29 ENCOUNTER — Ambulatory Visit: Payer: Self-pay | Admitting: *Deleted

## 2018-10-29 NOTE — Telephone Encounter (Signed)
  Patient phoned requesting to be tested for the virus. She has not traveled to any of the known listed locations.  She had visited the lobby of [facility] for 2 hours during her husband's colonoscopy on 10/23/18. 1-2 days later she began with a cough and feeling sore around her lungs especially when she bends over to do something. Cough is mostly non productive.Runny and congested nose reported. Feels short of breath when she bends over, she reported. Reports she is wheezing. Reports she feels nervous about the virus due to the fact of being in the [place] and not knowing. Reports temperature of 99.6 Saturday the 14th. None to report since then. Appointment made for tomorrow with PCP. Patient will wear a mask upon arrival. Reviewed urgent symptoms needing immediate evaluation. Patient not happy with triage nor appointment. Stated "I wish I had not called".   Reason for Disposition . [1] MODERATE longstanding difficulty breathing (e.g., speaks in phrases, SOB even at rest, pulse 100-120) AND [2] SAME as normal    Patient taking with ease. Stated she feels very nervous while talking.  Answer Assessment - Initial Assessment Questions 1. RESPIRATORY STATUS: "Describe your breathing?" (e.g., wheezing, shortness of breath, unable to speak, severe coughing) Productive cough.Hears herself breathe with wheezing.     Sneezing, coughing, chest and your back aches. Chest on both sides hurt.  2. ONSET: "When did this breathing problem begin?"     Around 3/12, 2 days after she waited on her husband in the lobby at the [place] in [place].  3. PATTERN "Does the difficult breathing come and go, or has it been constant since it started?"      Time to time she feels short of breath. Was using an inhaler she switched to neb treatments over the last 3 days. 4. SEVERITY: "How bad is your breathing?" (e.g., mild, moderate, severe)    - MILD: No SOB at rest, mild SOB with walking, speaks normally in sentences, can lay down,  no retractions, pulse < 100.    - MODERATE: SOB at rest, SOB with minimal exertion and prefers to sit, cannot lie down flat, speaks in phrases, mild retractions, audible wheezing, pulse 100-120.    - SEVERE: Very SOB at rest, speaks in single words, struggling to breathe, sitting hunched forward, retractions, pulse > 120      Feels weak and lying around a lot.  5. RECURRENT SYMPTOM: "Have you had difficulty breathing before?" If so, ask: "When was the last time?" and "What happened that time?"     Yes, when she has had bronchitis in the past.  6. CARDIAC HISTORY: "Do you have any history of heart disease?" (e.g., heart attack, angina, bypass surgery, angioplasty)     PAD, htn 7. LUNG HISTORY: "Do you have any history of lung disease?"  (e.g., pulmonary embolus, asthma, emphysema)    emphysema 8. CAUSE: "What do you think is causing the breathing problem?"     Thinks she been exposed to corona. 9. OTHER SYMPTOMS: "Do you have any other symptoms? (e.g., dizziness, runny nose, cough, chest pain, fever)     Runny nose and congest, chest feels tight. 10. PREGNANCY: "Is there any chance you are pregnant?" "When was your last menstrual period?"       no 11. TRAVEL: "Have you traveled out of the country in the last month?" (e.g., travel history, exposures)       no  Protocols used: BREATHING DIFFICULTY-A-AH

## 2018-10-29 NOTE — Telephone Encounter (Signed)
Dr. Jordan - FYI. Thanks! 

## 2018-10-30 ENCOUNTER — Ambulatory Visit (INDEPENDENT_AMBULATORY_CARE_PROVIDER_SITE_OTHER): Payer: Medicare Other | Admitting: Family Medicine

## 2018-10-30 ENCOUNTER — Encounter: Payer: Self-pay | Admitting: Family Medicine

## 2018-10-30 ENCOUNTER — Telehealth: Payer: Self-pay | Admitting: *Deleted

## 2018-10-30 ENCOUNTER — Other Ambulatory Visit: Payer: Self-pay

## 2018-10-30 VITALS — HR 96 | Resp 16 | Ht 67.5 in

## 2018-10-30 DIAGNOSIS — J069 Acute upper respiratory infection, unspecified: Secondary | ICD-10-CM

## 2018-10-30 DIAGNOSIS — J441 Chronic obstructive pulmonary disease with (acute) exacerbation: Secondary | ICD-10-CM

## 2018-10-30 DIAGNOSIS — J029 Acute pharyngitis, unspecified: Secondary | ICD-10-CM

## 2018-10-30 MED ORDER — ALBUTEROL SULFATE (2.5 MG/3ML) 0.083% IN NEBU: 2.5000 mg | INHALATION_SOLUTION | Freq: Four times a day (QID) | RESPIRATORY_TRACT | 2 refills | Status: DC | PRN

## 2018-10-30 MED ORDER — MAGIC MOUTHWASH W/LIDOCAINE: 5.0000 mL | Freq: Three times a day (TID) | ORAL | 0 refills | Status: AC | PRN

## 2018-10-30 MED ORDER — PREDNISONE 20 MG PO TABS: 40.0000 mg | ORAL_TABLET | Freq: Every day | ORAL | 0 refills | Status: AC

## 2018-10-30 NOTE — Telephone Encounter (Signed)
Megan Glover, were you sending a Rx for a new nebulizer machine? Pt is asking as she does not have one at home.

## 2018-10-30 NOTE — Progress Notes (Signed)
ACUTE VISIT   HPI:  Chief Complaint  Patient presents with   Cough   Fever    [name] is a 73 y.o. female who is here today with her husband complaining of 3 to 4 days of productive cough, body aches, and fever.  This visit was done outside the building, in her car.  Wheezing has improved today. Productive cough with clear sputum, denies hemoptysis.  States that while she was waiting for her husband's colonoscopy, for about 2 hours, One of the nurses was having a conversation with her and "coughing at the same time."  According to patient, the nurse did not look sick and she was not wearing a mask.  Symptoms are exacerbated by exertion and alleviated by rest. She denies chest pain, worsening SOB, palpitations, diaphoresis, or edema.   No history of recent travel or visitors from overseas.  History of COPD, currently she is on albuterol neb, which has helped with symptoms.  She uses medication as needed, usually when she has respiratory infections. She is using her grandson's nebulizer machine, she needs a prescription for one.  She does not feel like albuterol inhaler helps with acute symptoms.   She has not tolerated LABA/ICS.   She also has history of back pain and fibromyalgia.  Review of Systems  Constitutional: Positive for activity change, chills, fatigue and fever. Negative for appetite change.  HENT: Positive for congestion, postnasal drip, rhinorrhea and sore throat. Negative for ear pain, mouth sores, sinus pressure, sneezing, trouble swallowing and voice change.   Eyes: Negative for discharge and redness.  Respiratory: Positive for cough and wheezing.   Cardiovascular: Negative for chest pain.  Gastrointestinal: Negative for abdominal pain, diarrhea, nausea and vomiting.  Genitourinary: Negative for decreased urine volume and hematuria.  Musculoskeletal: Positive for back pain, gait problem (chronic,uses a cane.) and myalgias.  Negative for joint swelling.  Skin: Negative for rash.  Allergic/Immunologic: Positive for environmental allergies.  Neurological: Negative for weakness and headaches.  Psychiatric/Behavioral: Negative for confusion. The patient is nervous/anxious.     Current Outpatient Medications on File Prior to Visit  Medication Sig Dispense Refill   acetaminophen (TYLENOL) 500 MG tablet Take 1,000 mg by mouth every 8 (eight) hours as needed for moderate pain.     albuterol (PROVENTIL HFA;VENTOLIN HFA) 108 (90 Base) MCG/ACT inhaler INHALE 2 PUFFS INTO THE LUNGS EVERY 4 HOURS AS NEEDED 8.5 g 3   aspirin 81 MG chewable tablet Chew 81 mg by mouth.     Biotin 1000 MCG tablet Take by mouth.     BYSTOLIC 20 MG TABS TAKE ONE-HALF TABLET BY  MOUTH TWO TIMES DAILY 90 tablet 0   Cholecalciferol (VITAMIN D3) 2000 units TABS Take 1 tablet by mouth daily.     clonazePAM (KLONOPIN) 0.5 MG tablet TAKE 1 TABLET BY MOUTH TWICE DAILY AT 10 AM AND AT 10 PM 60 tablet 3   cyclobenzaprine (FLEXERIL) 10 MG tablet TAKE 1/2 TO 1 TABLET BY MOUTH DAILY AS NEEDED FOR MUSCLE SPASMS 90 tablet 1   gabapentin (NEURONTIN) 100 MG capsule Take 1 capsule (100 mg total) by mouth at bedtime. 30 capsule 2   hydrALAZINE (APRESOLINE) 10 MG tablet TAKE 1 TABLET(10 MG) BY MOUTH THREE TIMES DAILY 180 tablet 2   hydrocortisone (ANUSOL-HC) 2.5 % rectal cream Place 1 application rectally 2 (two) times daily. 30 g 1   levothyroxine (SYNTHROID, LEVOTHROID) 50 MCG tablet TAKE 1 TABLET(50 MCG) BY MOUTH DAILY BEFORE BREAKFAST  90 tablet 0   losartan (COZAAR) 100 MG tablet TAKE 1 TABLET(100 MG) BY MOUTH EVERY MORNING 90 tablet 2   OVER THE COUNTER MEDICATION Apply 2 drops to eye at bedtime as needed (for itchy dry eyes.).     pravastatin (PRAVACHOL) 20 MG tablet Take 1 tablet (20 mg total) by mouth daily. 90 tablet 0   No current facility-administered medications on file prior to visit.      Past Medical History:  Diagnosis Date    Anemia    Anxiety    Cataract    bilateral -left > right   Clostridium difficile diarrhea    05-09-14 denies any problems at this time   Depression    Fibromyalgia    History of diverticulosis    Hypertension    Hypothyroidism    IBS (irritable bowel syndrome)    Neuromuscular disorder (HCC)    fibromyalgia   Thyroid disease    Allergies  Allergen Reactions   Sulfa Antibiotics Other (See Comments) and Itching    Anal itching "anal itching"   Demerol  [Meperidine Hcl] Nausea And Vomiting   Levofloxacin In D5w Other (See Comments)    Yeast infections.  "Yeast infections"   Spironolactone Other (See Comments)    Weakness/malaise "Weakness" Other reaction(s): Other (See Comments) Weakness/malaise   Clindamycin     Other reaction(s): Other (See Comments)   Clindamycin/Lincomycin     CDIF   Erythromycin Diarrhea   Erythromycin Base     OTHER REACTION(S): Diarrhea Other reaction(s): Other (See Comments) OTHER REACTION(S): Diarrhea   Fluconazole Other (See Comments)   Hydrochlorothiazide Other (See Comments)    Weakness/malaise Other reaction(s): Other (See Comments) Weakness/malaise   Lincomycin Diarrhea    CDIF   Meperidine Nausea And Vomiting, Other (See Comments) and Hypertension    Severe headache OTHER REACTION(S): Hypertension Severe headache   Ciprofloxacin Diarrhea   Levofloxacin Nausea Only    Yeast infection OTHER REACTION(S): Diarrhea Other reaction(s): Other (See Comments) Yeast infection   Metronidazole Nausea Only    OTHER REACTION(S): Diarrhea OTHER REACTION(S): Diarrhea    Other Itching    Social History   Socioeconomic History   Marital status: Married    Spouse name: Megan Glover   Number of children: Not on file   Years of education: Not on file   Highest education level: Not on file  Occupational History   Not on file  Social Needs   Financial resource strain: Not on file   Food insecurity:     Worry: Not on file    Inability: Not on file   Transportation needs:    Medical: Not on file    Non-medical: Not on file  Tobacco Use   Smoking status: Former Smoker    Packs/day: 1.50    Years: 25.00    Pack years: 37.50    Types: Cigarettes    Last attempt to quit: 05/09/1980    Years since quitting: 38.5   Smokeless tobacco: Never Used  Substance and Sexual Activity   Alcohol use: No    Alcohol/week: 0.0 standard drinks   Drug use: No   Sexual activity: Not Currently  Lifestyle   Physical activity:    Days per week: Not on file    Minutes per session: Not on file   Stress: Not on file  Relationships   Social connections:    Talks on phone: Not on file    Gets together: Not on file    Attends religious service:  Not on file    Active member of club or organization: Not on file    Attends meetings of clubs or organizations: Not on file    Relationship status: Not on file  Other Topics Concern   Not on file  Social History Narrative   Not on file    Vitals:   10/30/18 1529  Pulse: 96  Resp: 16  SpO2: 98%   Body mass index is 25.94 kg/m.  Physical Exam  Nursing note and vitals reviewed. Constitutional: She is oriented to person, place, and time. She appears well-developed and well-nourished. She does not appear ill. No distress.  HENT:  Head: Normocephalic and atraumatic.  Nose: Rhinorrhea present. Right sinus exhibits no maxillary sinus tenderness and no frontal sinus tenderness. Left sinus exhibits no maxillary sinus tenderness and no frontal sinus tenderness.  Mouth/Throat: Mucous membranes are normal. Posterior oropharyngeal erythema (mild) present.  Mild postnasal drainage.  Eyes: Conjunctivae are normal.  Cardiovascular: Normal rate and regular rhythm.  No murmur heard. Respiratory: Effort normal. No respiratory distress. She has wheezes (mild). She has no rhonchi. She has no rales.  Lymphadenopathy:    She has no cervical adenopathy.    Neurological: She is alert and oriented to person, place, and time. She has normal strength.  Skin: Skin is warm. No rash noted. No erythema.  Psychiatric: Her mood appears anxious.  Well groomed, good eye contact.    ASSESSMENT AND PLAN:  Ms. Leda was seen today for cough and fever.  Diagnoses and all orders for this visit:  Chronic obstructive pulmonary disease with acute exacerbation (HCC) She has reported that wheezing has improved. Today on auscultation mild diffuse wheezing. No rales or rhonchi, so I do not think imaging is needed at this time.  She has taken prednisone in the past and has tolerated it well, recommend 40 mg daily for 5 days. Continue albuterol neb 3 times daily for 5 days then as needed. Instructed about warning signs.  -     predniSONE (DELTASONE) 20 MG tablet; Take 2 tablets (40 mg total) by mouth daily with breakfast for 5 days. -     albuterol (PROVENTIL) (2.5 MG/3ML) 0.083% nebulizer solution; Take 3 mLs (2.5 mg total) by nebulization every 6 (six) hours as needed for wheezing or shortness of breath. -     DME Nebulizer machine  URI, acute Explained that it is most likely viral. It is improving. She is concerned about coronavirus, I do not think we need to send her for testing. Contact precautions discussed. Adequate hydration and rest. Explained that cough and congestion can persist for a few more days or weeks after acute symptoms resolved.  Sore throat Mild erythema. Symptomatic treatment with mouthwash with lidocaine recommended. Instructed about warning signs.  -     magic mouthwash w/lidocaine SOLN; Take 5 mLs by mouth 3 (three) times daily as needed for up to 10 days for mouth pain. 50 ml of diphenhydramine, alum and mag hydroxide, and lidocaine to make 150 ml     No follow-ups on file.    Megan Perfecto G. [place], MD  [facility]. [place] office.

## 2018-10-30 NOTE — Patient Instructions (Signed)
A few things to remember from today's visit:   Chronic obstructive pulmonary disease with acute exacerbation (HCC) - Plan: predniSONE (DELTASONE) 20 MG tablet, albuterol (PROVENTIL) (2.5 MG/3ML) 0.083% nebulizer solution, DME Nebulizer machine  URI, acute  Sore throat - Plan: magic mouthwash w/lidocaine SOLN  Albuterol nebulizer 3 times per day for 7 days. Take prednisone with food. Mouthwash to gargle for sore throat.  Please be sure medication list is accurate. If a new problem present, please set up appointment sooner than planned today.

## 2018-10-30 NOTE — Telephone Encounter (Signed)
Questions for Screening COVID-19  Symptom onset:  Cough with clear mucous x 3-5 days ago SOB Chills Feverish - highest 99.6 Body aches   Travel or Contacts:  No travel Pt has recently been at the [facility] hospital 10/23/2018 when her husband had a colonoscopy and was sitting next to a woman that was coughing and sick.   During this illness, did/does the patient experience any of the following symptoms? Fever >100.60F [x]   Yes []   No []   Unknown  LOW GRADE Subjective fever (felt feverish) [x]   Yes []   No []   Unknown Chills [x]   Yes []   No []   Unknown Muscle aches (myalgia) [x]   Yes []   No []   Unknown Runny nose (rhinorrhea) [x]   Yes []   No []   Unknown Sore throat [x]   Yes []   No []   Unknown Cough (new onset or worsening of chronic cough) [x]   Yes []   No []   Unknown Shortness of breath (dyspnea) [x]   Yes []   No []   Unknown Nausea or vomiting []   Yes [x]   No []   Unknown Headache [x]   Yes []   No []   Unknown Abdominal pain  []   Yes [x]   No []   Unknown Diarrhea (?3 loose/looser than normal stools/24hr period) [x]   Yes []   No []   Unknown Other, specify:_____________________________________________   Patient risk factors: Smoker? []   Current []   Former []   Never If female, currently pregnant? []   Yes []   No  Patient Active Problem List   Diagnosis Date Noted  . Hyponatremia 09/28/2018  . External hemorrhoid 09/28/2018  . Peripheral neuropathy 05/30/2018  . Insomnia 07/28/2017  . Back pain with left-sided radiculopathy 03/20/2017  . Atrophic vaginitis 11/17/2016  . Osteoporosis 11/17/2016  . COPD (chronic obstructive pulmonary disease) (HCC) 11/16/2016  . PAD (peripheral artery disease) (HCC) 11/23/2015  . Vitamin D deficiency 11/23/2015  . Generalized anxiety disorder 02/26/2014  . Hypothyroidism 02/25/2014  . Essential hypertension 02/20/2014  . Hypertensive urgency 09/12/2013  . Fibromyalgia   . Irritable bowel syndrome with constipation     Plan:  []   High risk for COVID-19  with red flags go to ED (with CP, SOB, weak/lightheaded, or fever > 101.5). Call ahead.  [x]   High risk for COVID-19 but stable will have car visit. Inform provider and coordinate time. Will be completed in afternoon. []   No red flags but URI signs or symptoms will go through side door and be seen in dedicated room.  Note: Referral to telemedicine is an appropriate alternative disposition for higher risk but stable. [facility]/e-Visit: [phone].

## 2018-10-31 ENCOUNTER — Telehealth: Payer: Self-pay | Admitting: *Deleted

## 2018-10-31 NOTE — Telephone Encounter (Signed)
Spoke with patient and informed her that her information was sent over to [facility] on 10/30/2018 and that she should be hearing from them about the nebulizer. Patient verbalized understanding.  Copied from CRM [id number]. Topic: General - Inquiry >> Oct 31, 2018  8:19 AM Lynne Logan D wrote: Reason for CRM: Pt stated she had an OV on yesterday and was supposed to have a rx/order for a nebulizer machine and has not heard anything regarding this. She would like a callback. Please advise. CB#[phone]

## 2018-11-01 ENCOUNTER — Encounter: Payer: Self-pay | Admitting: Family Medicine

## 2018-11-01 ENCOUNTER — Telehealth: Payer: Self-pay | Admitting: Family Medicine

## 2018-11-01 NOTE — Telephone Encounter (Signed)
Copied from CRM [id number]. Topic: Quick Communication - Rx Refill/Question >> Nov 01, 2018  8:50 AM Baldo Daub L wrote: Medication: Nebulizer  Pt states she was told that [facility] doesn't have an order for her nebulizer.  Pt states that she has been told by Dr. Sw[place]iland that she should be in home isolation, but [facility] is telling her she must come and pick up nebulizer.   Pt can be reached at [phone]

## 2018-11-02 NOTE — Telephone Encounter (Signed)
Spoke with patient through my chart concerning issue. Nothing further needed at this time.

## 2018-11-06 ENCOUNTER — Encounter: Payer: Self-pay | Admitting: Family Medicine

## 2018-11-20 ENCOUNTER — Other Ambulatory Visit: Payer: Self-pay | Admitting: Family Medicine

## 2018-11-20 DIAGNOSIS — G629 Polyneuropathy, unspecified: Secondary | ICD-10-CM

## 2018-11-21 ENCOUNTER — Other Ambulatory Visit: Payer: Self-pay | Admitting: Family Medicine

## 2018-11-21 DIAGNOSIS — I739 Peripheral vascular disease, unspecified: Secondary | ICD-10-CM

## 2018-11-24 ENCOUNTER — Other Ambulatory Visit: Payer: Self-pay | Admitting: Family Medicine

## 2018-11-24 DIAGNOSIS — I10 Essential (primary) hypertension: Secondary | ICD-10-CM

## 2018-12-27 ENCOUNTER — Other Ambulatory Visit: Payer: Self-pay | Admitting: Family Medicine

## 2018-12-27 DIAGNOSIS — I10 Essential (primary) hypertension: Secondary | ICD-10-CM

## 2019-01-01 ENCOUNTER — Other Ambulatory Visit: Payer: Self-pay | Admitting: Family Medicine

## 2019-01-01 DIAGNOSIS — I10 Essential (primary) hypertension: Secondary | ICD-10-CM

## 2019-01-01 MED ORDER — NEBIVOLOL HCL 20 MG PO TABS: 0.5000 | ORAL_TABLET | Freq: Two times a day (BID) | ORAL | 0 refills | Status: DC

## 2019-01-27 ENCOUNTER — Other Ambulatory Visit: Payer: Self-pay | Admitting: Family Medicine

## 2019-01-27 DIAGNOSIS — F419 Anxiety disorder, unspecified: Secondary | ICD-10-CM

## 2019-01-28 ENCOUNTER — Other Ambulatory Visit: Payer: Self-pay | Admitting: Family Medicine

## 2019-01-28 DIAGNOSIS — F419 Anxiety disorder, unspecified: Secondary | ICD-10-CM

## 2019-01-29 ENCOUNTER — Other Ambulatory Visit: Payer: Self-pay | Admitting: Family Medicine

## 2019-01-29 DIAGNOSIS — F419 Anxiety disorder, unspecified: Secondary | ICD-10-CM

## 2019-01-29 NOTE — Telephone Encounter (Signed)
Pt called to f/u on refill for clonazepam. She took last dose this morning. Advised pt to notify office 3 business days ahead for refill processing.   [id number] - [place], [place] - [address]

## 2019-01-30 NOTE — Telephone Encounter (Signed)
Pt called and stated that she is completley out of medication. Pt states that she needs this medication badly. Please advise

## 2019-01-31 ENCOUNTER — Other Ambulatory Visit: Payer: Self-pay | Admitting: Adult Health

## 2019-01-31 ENCOUNTER — Ambulatory Visit: Payer: Self-pay | Admitting: *Deleted

## 2019-01-31 DIAGNOSIS — F419 Anxiety disorder, unspecified: Secondary | ICD-10-CM

## 2019-01-31 MED ORDER — CLONAZEPAM 0.5 MG PO TABS: 0.5000 mg | ORAL_TABLET | Freq: Two times a day (BID) | ORAL | 0 refills | Status: DC

## 2019-01-31 NOTE — Telephone Encounter (Signed)
Spoke with patient. Advised patient that she should not wait until last minute to refill rx and to request refill when she has a week supply left. Patietn verbalized understanding. Megan Glover, will write for short term supply. Dr. Martinique please refill medication when you return to office on tomorrow.

## 2019-01-31 NOTE — Telephone Encounter (Signed)
Patient requesting refill on daily medication,clonazepam 0.5 mg tab she takes twice daily everyday. Requested 1st on 01/27/19. Last dose taken was Monday now feeling effects of not taking it now for over 48 hours-shaky and nervous. Please have a provider, understand Dr.Jordan is not in today, send to [facility] in [place].

## 2019-01-31 NOTE — Telephone Encounter (Signed)
Dr. Volanda Napoleon - would you be able to send in short-term supply as Dr. Martinique is not in the office and pt is out of medication. Refill request originally sent on 01/27/19 so is past our 3-day policy. Please advise. Thank you!  Vivianne Master on the refill time length. Thanks!

## 2019-01-31 NOTE — Telephone Encounter (Signed)
Sent in 6 pills ( 3 days)

## 2019-01-31 NOTE — Telephone Encounter (Signed)
Last refill 2 May'20 Reason for Disposition . [1] Request for URGENT new prescription or refill of "essential" medication (i.e., likelihood of harm to patient if not taken) AND [2] triager unable to fill per unit policy  Answer Assessment - Initial Assessment Questions 1. SYMPTOMS: "Do you have any symptoms?"     shaky 2. SEVERITY: If symptoms are present, ask "Are they mild, moderate or severe?"     moderate  Protocols used: MEDICATION QUESTION CALL-A-AH

## 2019-02-01 NOTE — Telephone Encounter (Signed)
Rx for Clonazepam sent. Thanks, BJ

## 2019-02-11 ENCOUNTER — Other Ambulatory Visit: Payer: Self-pay | Admitting: Family Medicine

## 2019-02-11 DIAGNOSIS — I10 Essential (primary) hypertension: Secondary | ICD-10-CM

## 2019-03-08 ENCOUNTER — Other Ambulatory Visit: Payer: Self-pay | Admitting: Family Medicine

## 2019-03-08 DIAGNOSIS — G629 Polyneuropathy, unspecified: Secondary | ICD-10-CM

## 2019-03-12 ENCOUNTER — Other Ambulatory Visit: Payer: Self-pay | Admitting: Family Medicine

## 2019-04-17 ENCOUNTER — Telehealth: Payer: Self-pay | Admitting: Family Medicine

## 2019-04-17 DIAGNOSIS — H00012 Hordeolum externum right lower eyelid: Secondary | ICD-10-CM

## 2019-04-24 ENCOUNTER — Encounter: Payer: Self-pay | Admitting: Family Medicine

## 2019-04-24 NOTE — Telephone Encounter (Signed)
Pt calling and want to know status of her refill and why it hasn't been filled.

## 2019-04-26 NOTE — Telephone Encounter (Signed)
Pt called in to follow up on request. Pt says that she commonly get sty on her eye.   Pt says when this happens this is the medication that she is prescribed. Pt would like to know if provider could send in a Rx for ointment?   Please assist.

## 2019-04-30 ENCOUNTER — Encounter: Payer: Self-pay | Admitting: Family Medicine

## 2019-04-30 NOTE — Telephone Encounter (Signed)
My chart message regarding this was sent to Dr. Martinique this morning. Please see mychart encounter for further documentation.

## 2019-04-30 NOTE — Telephone Encounter (Signed)
Please advise.  Phone encounter from 04/17/2019 states,   11:58 AM Note   Pt called in to follow up on request. Pt says that she commonly get sty on her eye.   Pt says when this happens this is the medication that she is prescribed. Pt would like to know if provider could send in a Rx for ointment?   Please assist.      --- I see that medication was denied with no reason given.

## 2019-05-08 ENCOUNTER — Other Ambulatory Visit: Payer: Self-pay | Admitting: Family Medicine

## 2019-05-08 DIAGNOSIS — I10 Essential (primary) hypertension: Secondary | ICD-10-CM

## 2019-05-21 ENCOUNTER — Other Ambulatory Visit: Payer: Self-pay | Admitting: Family Medicine

## 2019-05-21 DIAGNOSIS — I10 Essential (primary) hypertension: Secondary | ICD-10-CM

## 2019-05-22 ENCOUNTER — Other Ambulatory Visit: Payer: Self-pay | Admitting: Family Medicine

## 2019-05-22 DIAGNOSIS — I10 Essential (primary) hypertension: Secondary | ICD-10-CM

## 2019-05-30 ENCOUNTER — Other Ambulatory Visit: Payer: Self-pay | Admitting: Family Medicine

## 2019-05-30 DIAGNOSIS — G629 Polyneuropathy, unspecified: Secondary | ICD-10-CM

## 2019-05-30 DIAGNOSIS — F419 Anxiety disorder, unspecified: Secondary | ICD-10-CM

## 2019-06-04 NOTE — Telephone Encounter (Signed)
Pt is calling again and would like to see if the medication can be sent in before the end of the day.  Pt is aware that provider is in with a pt.  Pt was using excessive profanity with me and stated that she has talked with several people today and would like for me to guarantee that her medication will be called in today.  Pt stated that she just found out that she was out of her medication this weekend when our office was closed and she really needs the medication because she is about to cry because it seems that no one cares that the medication is given to her.  Pt was very rude and would not allow me to elaborate to the point and hung up on me.

## 2019-06-04 NOTE — Telephone Encounter (Signed)
Sent to PCP for approval.  

## 2019-06-04 NOTE — Telephone Encounter (Signed)
Pt called to report that she desperately needs refill of clonazepam sent to  [facility], [place] - [address]  [address] [place] [address]  Phone: [phone] Fax: [phone]

## 2019-06-04 NOTE — Telephone Encounter (Signed)
Pt calling back to check status. Pt states that she is very desperate for medication. Pt is very upset that medication has not been filled. Please advise

## 2019-06-04 NOTE — Telephone Encounter (Signed)
Patient calling back about this medication. She is requesting a call back as soon as possible to discuss why it has not been filled yet.

## 2019-06-04 NOTE — Telephone Encounter (Signed)
Copied from [place] [id number] [id number]. Topic: General - Other >> Jun 04, 2019  1:54 PM Rainey Pines A wrote: Patient would like a callback in regards to medication request from 10/15. Patient is requesting a callback today. Best contact [phone]

## 2019-06-05 ENCOUNTER — Other Ambulatory Visit: Payer: Self-pay | Admitting: Family Medicine

## 2019-06-05 DIAGNOSIS — G629 Polyneuropathy, unspecified: Secondary | ICD-10-CM

## 2019-06-05 DIAGNOSIS — F419 Anxiety disorder, unspecified: Secondary | ICD-10-CM

## 2019-06-05 MED ORDER — CLONAZEPAM 0.5 MG PO TABS: 0.5000 mg | ORAL_TABLET | Freq: Two times a day (BID) | ORAL | 1 refills | Status: DC | PRN

## 2019-06-05 MED ORDER — GABAPENTIN 100 MG PO CAPS: ORAL_CAPSULE | ORAL | 2 refills | Status: DC

## 2019-06-05 NOTE — Telephone Encounter (Signed)
I just received request yesterday. Rx sent. Last F/U over 6 months,she needs f/u appt, which can be virtual if she prefers so.  Thanks, BJ

## 2019-06-08 ENCOUNTER — Other Ambulatory Visit: Payer: Self-pay | Admitting: Family Medicine

## 2019-06-21 ENCOUNTER — Other Ambulatory Visit: Payer: Self-pay | Admitting: Family Medicine

## 2019-06-21 DIAGNOSIS — G629 Polyneuropathy, unspecified: Secondary | ICD-10-CM

## 2019-07-01 ENCOUNTER — Other Ambulatory Visit: Payer: Self-pay | Admitting: Family Medicine

## 2019-07-01 DIAGNOSIS — F419 Anxiety disorder, unspecified: Secondary | ICD-10-CM

## 2019-07-06 ENCOUNTER — Other Ambulatory Visit: Payer: Self-pay | Admitting: Family Medicine

## 2019-07-07 ENCOUNTER — Encounter: Payer: Self-pay | Admitting: Family Medicine

## 2019-07-10 ENCOUNTER — Other Ambulatory Visit: Payer: Self-pay

## 2019-07-26 ENCOUNTER — Other Ambulatory Visit: Payer: Self-pay | Admitting: Family Medicine

## 2019-07-26 ENCOUNTER — Other Ambulatory Visit: Payer: Self-pay

## 2019-07-26 DIAGNOSIS — F419 Anxiety disorder, unspecified: Secondary | ICD-10-CM

## 2019-07-30 ENCOUNTER — Telehealth: Payer: Self-pay

## 2019-07-30 ENCOUNTER — Other Ambulatory Visit: Payer: Self-pay | Admitting: Family Medicine

## 2019-07-30 DIAGNOSIS — F419 Anxiety disorder, unspecified: Secondary | ICD-10-CM

## 2019-07-30 NOTE — Telephone Encounter (Signed)
Duplicate request, see other refill request.

## 2019-07-30 NOTE — Telephone Encounter (Signed)
I called and spoke pt. She is scheduled for a telephone visit tomorrow at 2pm.

## 2019-07-30 NOTE — Telephone Encounter (Signed)
Copied from [place] [id number]. Topic: Quick Communication - See Telephone Encounter >> Jul 30, 2019  4:23 PM Loma Boston wrote: CRM for notification. See Telephone encounter for: 07/30/19. Please call this pt re: she is very confused and a bit irate over not getting this refill, says has been calling repeatedly but no record PLS FU lonazePAM (KLONOPIN) 0.5 MG tablet

## 2019-07-30 NOTE — Telephone Encounter (Signed)
Requested medication (s) are due for refill today: yes  Requested medication (s) are on the active medication list: yes  Last refill:  06/05/2019  Future visit scheduled: no  Notes to clinic refill cannot be delegated    Requested Prescriptions  Pending Prescriptions Disp Refills   clonazePAM (KLONOPIN) 0.5 MG tablet 60 tablet 1    Sig: Take 1 tablet (0.5 mg total) by mouth 2 (two) times daily as needed for anxiety.      Not Delegated - Psychiatry:  Anxiolytics/Hypnotics Failed - 07/30/2019 11:14 AM      Failed - This refill cannot be delegated      Failed - Urine Drug Screen completed in last 360 days.      Failed - Valid encounter within last 6 months    Recent Outpatient Visits           9 months ago Chronic obstructive pulmonary disease with acute exacerbation ([place])   [facility], Malka So, MD   10 months ago Essential hypertension   [facility], Malka So, MD   1 year ago PAD (peripheral artery disease) ([place])   [facility], Malka So, MD   1 year ago Fall, accidental, initial Education administrator at [facility], Malka So, MD   1 year ago Abdominal pain, generalized   [facility], Malka So, MD

## 2019-07-30 NOTE — Telephone Encounter (Signed)
Medication Refill - Medication: clonazePAM (KLONOPIN) 0.5 MG tablet [[id number]    Preferred Pharmacy (with phone number or street name):  [id number] - [place], [place] - [address] [place] [address] [place] [address] [place] [address]  [address] [place] [address] [place] [address]  Phone: [phone] Fax: [phone]     Agent: Please be advised that RX refills may take up to 3 business days. We ask that you follow-up with your pharmacy.

## 2019-07-31 ENCOUNTER — Encounter: Payer: Self-pay | Admitting: Family Medicine

## 2019-07-31 ENCOUNTER — Telehealth (INDEPENDENT_AMBULATORY_CARE_PROVIDER_SITE_OTHER): Payer: Medicare Other | Admitting: Family Medicine

## 2019-07-31 DIAGNOSIS — F411 Generalized anxiety disorder: Secondary | ICD-10-CM | POA: Diagnosis not present

## 2019-07-31 DIAGNOSIS — I10 Essential (primary) hypertension: Secondary | ICD-10-CM | POA: Diagnosis not present

## 2019-07-31 DIAGNOSIS — M797 Fibromyalgia: Secondary | ICD-10-CM | POA: Diagnosis not present

## 2019-07-31 DIAGNOSIS — K581 Irritable bowel syndrome with constipation: Secondary | ICD-10-CM

## 2019-07-31 DIAGNOSIS — N390 Urinary tract infection, site not specified: Secondary | ICD-10-CM

## 2019-07-31 DIAGNOSIS — R319 Hematuria, unspecified: Secondary | ICD-10-CM

## 2019-07-31 MED ORDER — AMOXICILLIN 500 MG PO CAPS: 500.0000 mg | ORAL_CAPSULE | Freq: Three times a day (TID) | ORAL | 0 refills | Status: AC

## 2019-07-31 MED ORDER — CLONAZEPAM 0.5 MG PO TABS: 0.5000 mg | ORAL_TABLET | Freq: Two times a day (BID) | ORAL | 3 refills | Status: DC | PRN

## 2019-07-31 MED ORDER — CYCLOBENZAPRINE HCL 10 MG PO TABS: ORAL_TABLET | ORAL | 1 refills | Status: DC

## 2019-07-31 NOTE — Progress Notes (Signed)
Virtual Visit via Telephone Note  I connected with Megan Glover on 07/31/19 at  2:00 PM EST by telephone and verified that I am speaking with the correct person using two identifiers.   I discussed the limitations, risks, security and privacy concerns of performing an evaluation and management service by telephone and the availability of in person appointments. I also discussed with the patient that there may be a patient responsible charge related to this service. The patient expressed understanding and agreed to proceed.  Location patient: home Location provider: work office Participants present for the call: patient, provider Patient did not have a visit in the prior 7 days to address this/these issue(s).   History of Present Illness: Ms Megan Glover is a 73 yo female following on some chronic medical problems.  HLD: She is not longer on Pravastatin,it aggravated myalgias. She is following low fat diet. PAD, she is on Aspirin 81 mg daily.  Lab Results  Component Value Date   CHOL 184 11/23/2015   HDL 49.20 11/23/2015   LDLCALC 120 (H) 11/23/2015   TRIG 74.0 11/23/2015   CHOLHDL 4 11/23/2015   HTN: She is on Hydralazine 10 mg bid, Losartan 100 mg daily,and Bystolic 20 mg 1/2 tab bid. Tolerating medication well. Not checking BP's at home, this aggravates anxiety.  Lab Results  Component Value Date   CREATININE 0.79 09/28/2018   BUN 15 09/28/2018   NA 135 09/28/2018   K 5.1 09/28/2018   CL 100 09/28/2018   CO2 28 09/28/2018   Fibromyalgia: She is on Flexeril 5-10 mg daily and Gabapentin 100 mg at night. She thinks Gabapentin may be causing constipation. Hx of IBS-C. Most medications she has tried on the past caused diarrhea.  Having bowel movement q 3 days.  COPD: Albuterol q 2 days, at night. Inh. She has not use Albuterol nebs in a while, she does for exacerbations associated with respiratory infections. . For 3 days she has had nocturia x 3-4 times. Today ,  pelvic cramps,similar to menstrual pain Hematuria , saw blood in toilet. Dysuria and urgency. She has had gross hematuria before. She has not seen urologist before.  "Little" nausea this morning. Anxiety: She is on Clonazepam 0.5 mg bid. She has been on this medication for years. No side effects and helping with problem.  Last filled on 07/03/19.  Observations/Objective: Patient sounds cheerful and well on the phone. I do not appreciate any SOB. Speech and thought processing are grossly intact. Patient reported vitals:N/A  Assessment and Plan: 1. Urinary tract infection with hematuria, site unspecified She has had gross hematuria in the past. + Former smoker. She prefers to hold on urologic evaluation. She doe snot feel comfortable coming to have labs done. She states that when she had similar symptoms in the past Amoxicillin was prescribed. Other abx's have caused side effects. Adequate hydration. If symptoms are persistent we will need urology referral.  - amoxicillin (AMOXIL) 500 MG capsule; Take 1 capsule (500 mg total) by mouth 3 (three) times daily for 7 days.  Dispense: 21 capsule; Refill: 0  2. Essential hypertension Problem has been adequately controlled. No changes in current management. Continue low salt diet.  3. Generalized anxiety disorder Stable. Continue Clonazepam 0.5 mg bid prn. Rx sent.  - clonazePAM (KLONOPIN) 0.5 MG tablet; Take 1 tablet (0.5 mg total) by mouth 2 (two) times daily as needed for anxiety.  Dispense: 60 tablet; Refill: 3  4. Fibromyalgia Stable. Some side effects of Gabapentin discussed. Cymbalta  was not well tolerated in the past. For now she prefers to continue Gabapentin for now. No changes in Flexeril.  - cyclobenzaprine (FLEXERIL) 10 MG tablet; TAKE 1/2 TO 1 TABLET BY MOUTH DAILY AS NEEDED FOR MUSCLE SPASMS  Dispense: 90 tablet; Refill: 1  5. Irritable bowel syndrome with constipation Try Miralax 1/4 of the dose. Bisacodyl  is another good option and can be added if problems is present..  Follow Up Instructions:  Return in about 5 months (around 12/29/2019) for Chronic disease management..  I did not refer this patient for an OV in the next 24 hours for this/these issue(s).  I discussed the assessment and treatment plan with the patient. She was provided an opportunity to ask questions and all were answered. She agreed with the plan and demonstrated an understanding of the instructions.   I provided 18-19 minutes of non-face-to-face time during this encounter.   [facility] [place], MD

## 2019-08-14 ENCOUNTER — Telehealth: Payer: Self-pay | Admitting: Family Medicine

## 2019-08-14 MED ORDER — HYDROCORTISONE (PERIANAL) 2.5 % EX CREA: 1.0000 "application " | TOPICAL_CREAM | Freq: Two times a day (BID) | CUTANEOUS | 1 refills | Status: DC

## 2019-08-14 NOTE — Telephone Encounter (Signed)
rx refill hydrocortisone (ANUSOL-HC) 2.5 % rectal cream  PHARMACY [facility] #[id number] - [place], [place] - [address] Phone:  [phone]  Fax:  [phone]

## 2019-08-14 NOTE — Telephone Encounter (Signed)
Message Routed to PCP CMA 

## 2019-08-14 NOTE — Telephone Encounter (Signed)
Rx was faxed in this morning, but Rx re-sent electronically to ensure pharmacy receives.

## 2019-09-04 ENCOUNTER — Other Ambulatory Visit: Payer: Self-pay

## 2019-09-04 MED ORDER — LEVOTHYROXINE SODIUM 50 MCG PO TABS: ORAL_TABLET | ORAL | 1 refills | Status: DC

## 2019-09-17 ENCOUNTER — Other Ambulatory Visit: Payer: Self-pay

## 2019-09-17 DIAGNOSIS — G629 Polyneuropathy, unspecified: Secondary | ICD-10-CM

## 2019-09-17 MED ORDER — GABAPENTIN 100 MG PO CAPS: ORAL_CAPSULE | ORAL | 2 refills | Status: DC

## 2019-09-23 ENCOUNTER — Other Ambulatory Visit: Payer: Self-pay | Admitting: Family Medicine

## 2019-11-08 ENCOUNTER — Other Ambulatory Visit: Payer: Self-pay | Admitting: Family Medicine

## 2019-11-08 DIAGNOSIS — F411 Generalized anxiety disorder: Secondary | ICD-10-CM

## 2019-11-13 NOTE — Telephone Encounter (Signed)
Pt is calling back  She wants to know when her RX  will be refilled pt says she need this by the weekend [phone]

## 2019-11-14 ENCOUNTER — Other Ambulatory Visit: Payer: Self-pay | Admitting: Family Medicine

## 2019-11-14 DIAGNOSIS — I10 Essential (primary) hypertension: Secondary | ICD-10-CM

## 2019-11-29 ENCOUNTER — Other Ambulatory Visit: Payer: Self-pay | Admitting: Family Medicine

## 2019-12-12 ENCOUNTER — Other Ambulatory Visit: Payer: Self-pay | Admitting: Family Medicine

## 2020-02-09 ENCOUNTER — Other Ambulatory Visit: Payer: Self-pay | Admitting: Family Medicine

## 2020-02-09 DIAGNOSIS — I10 Essential (primary) hypertension: Secondary | ICD-10-CM

## 2020-02-12 ENCOUNTER — Other Ambulatory Visit: Payer: Self-pay

## 2020-02-12 ENCOUNTER — Ambulatory Visit (INDEPENDENT_AMBULATORY_CARE_PROVIDER_SITE_OTHER): Payer: Medicare Other | Admitting: Family Medicine

## 2020-02-12 ENCOUNTER — Encounter: Payer: Self-pay | Admitting: Family Medicine

## 2020-02-12 VITALS — BP 130/80 | HR 60 | Temp 97.9°F | Resp 16 | Ht 67.5 in | Wt 165.0 lb

## 2020-02-12 DIAGNOSIS — G4700 Insomnia, unspecified: Secondary | ICD-10-CM

## 2020-02-12 DIAGNOSIS — I10 Essential (primary) hypertension: Secondary | ICD-10-CM | POA: Diagnosis not present

## 2020-02-12 DIAGNOSIS — F411 Generalized anxiety disorder: Secondary | ICD-10-CM | POA: Diagnosis not present

## 2020-02-12 DIAGNOSIS — E871 Hypo-osmolality and hyponatremia: Secondary | ICD-10-CM

## 2020-02-12 DIAGNOSIS — R350 Frequency of micturition: Secondary | ICD-10-CM | POA: Diagnosis not present

## 2020-02-12 DIAGNOSIS — E039 Hypothyroidism, unspecified: Secondary | ICD-10-CM

## 2020-02-12 DIAGNOSIS — J441 Chronic obstructive pulmonary disease with (acute) exacerbation: Secondary | ICD-10-CM | POA: Diagnosis not present

## 2020-02-12 DIAGNOSIS — E559 Vitamin D deficiency, unspecified: Secondary | ICD-10-CM

## 2020-02-12 DIAGNOSIS — M797 Fibromyalgia: Secondary | ICD-10-CM

## 2020-02-12 DIAGNOSIS — I739 Peripheral vascular disease, unspecified: Secondary | ICD-10-CM | POA: Diagnosis not present

## 2020-02-12 DIAGNOSIS — K581 Irritable bowel syndrome with constipation: Secondary | ICD-10-CM

## 2020-02-12 MED ORDER — MIRABEGRON ER 25 MG PO TB24: 25.0000 mg | ORAL_TABLET | Freq: Every day | ORAL | 2 refills | Status: DC

## 2020-02-12 NOTE — Assessment & Plan Note (Signed)
Continue levothyroxine 50 mcg daily. Further recommendation will be given according to TSH result. 

## 2020-02-12 NOTE — Assessment & Plan Note (Addendum)
Urinary frequency is aggravating problem, hopefully treating urinary symptoms is going to improve sleep. Continue clonazepam 0.5 mg at bedtime and good sleep hygiene.

## 2020-02-12 NOTE — Progress Notes (Signed)
HPI: Megan Glover is a 74 y.o. female, who is here today with her husband for chronic disease management.   Last visit, virtual,on 07/31/19. Since her last visit she has seen her eye care provider and dx;ed with corneal edema.  -Hypertension: Currently she is on Bystolic 1/2 tablet twice daily, hydralazine 10 mg 3 times daily, and losartan 100 mg daily. She has tried many other antihypertensive medications with poor tolerance. She does not check BP at home because this aggravates her anxiety.  PAD: She is on Aspirin 81 mg daily. Statin aggravated myalgias.  CTA of abdominal aorta with iliofemoral runoff in 11/2013: 1. No evidence of acute vascular abnormality.  2. 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  3. Moderate stenosis of the proximal left common iliac artery.  4. The bilateral outflow and runoff vessels are relatively disease free.   Negative for severe/frequent headache,chest pain, dyspnea, focal weakness, or edema.  Lab Results  Component Value Date   CREATININE 0.79 09/28/2018   BUN 15 09/28/2018   NA 135 09/28/2018   K 5.1 09/28/2018   CL 100 09/28/2018   CO2 28 09/28/2018   -Hypothyroidism: She is on Levothyroxine 50 mcg daily.  Lab Results  Component Value Date   TSH 1.94 09/28/2018   Lab Results  Component Value Date   ALT 16 09/28/2018   AST 22 09/28/2018   ALKPHOS 57 09/28/2018   BILITOT 0.5 09/28/2018   -Vitamin D deficiency: Currently she is on vitamin D3 2000 units daily.  -Anxiety: She is on Clonazepam 0.5 mg bid. She has been on medication for years, still helps.  Clonazepam also helps with insomnia, sleeping 4-5 hours. Wakes up to use bathroom,urine frequency. Negative for dysuria, urine incontinence,or gross hematuria. Problem has been going on for years and getting worse.  -Today she is concerned about constipation and abdominal pain. Hx of IBS-C. She  has tried different medications and OTC treatments but have not been well tolerated. Linzess and Amitiza caused diarrhea. She tried [employer], [employer] but also caused diarrhea.  Prune juice used to help but not any more. Having bowel movements q 3 days, hard stool.straining. Generalized abdominal cramps,lower abdomen,not radiated. Mildly improve after defecation or passing gas. Negative for fever,chills,nasuea,or vomiting.  Constipation and diarrhea aggravate her hemorrhoids,occasionally she sees blood on tissue after defecation. She uses topical hydrocortisone. Colonoscopy on 11/16/2016, 5 years follow up was recommended.  Fibromyalgia: She takes Flexeril 10 mg 1/2 to 1 tab daily as needed. + Myalgias and arthralgias exacerbated by certain movements. + Unstable gait assisted by a cane.  COPD:She is on Albuterol inh 2 puff bid. Negative for cough and wheezing.  She uses Albuterol neb when she has an acute respiratory tract infection. ICS/LABA caused oral sores.  Review of Systems  Constitutional: Positive for fatigue. Negative for activity change and appetite change.  HENT: Negative for mouth sores, nosebleeds and sore throat.   Eyes: Negative for redness and visual disturbance.  Gastrointestinal:       Negative for changes in bowel habits.  Endocrine: Negative for cold intolerance and heat intolerance.  Genitourinary: Negative for decreased urine volume and difficulty urinating.  Musculoskeletal: Positive for gait problem.  Skin: Negative for pallor and rash.  Allergic/Immunologic: Positive for environmental allergies.  Neurological: Negative for syncope, facial asymmetry and weakness.  Psychiatric/Behavioral: Positive for sleep disturbance. Negative for confusion and hallucinations. The patient is nervous/anxious.   Rest of ROS, see  pertinent positives sand negatives in HPI  Current Outpatient Medications on File Prior to Visit  Medication Sig Dispense Refill  . acetaminophen (TYLENOL) 500  MG tablet Take 1,000 mg by mouth every 8 (eight) hours as needed for moderate pain.    Marland Kitchen albuterol (PROVENTIL) (2.5 MG/3ML) 0.083% nebulizer solution Take 3 mLs (2.5 mg total) by nebulization every 6 (six) hours as needed for wheezing or shortness of breath. 150 mL 2  . albuterol (VENTOLIN HFA) 108 (90 Base) MCG/ACT inhaler INHALE 2 PUFFS INTO THE LUNGS EVERY 4 HOURS AS NEEDED 8.5 g 3  . aspirin 81 MG chewable tablet Chew 81 mg by mouth.    . Biotin 1000 MCG tablet Take by mouth.    . BYSTOLIC 20 MG TABS TAKE ONE-HALF TABLET BY  MOUTH TWICE DAILY 90 tablet 3  . Cholecalciferol (VITAMIN D3) 2000 units TABS Take 1 tablet by mouth daily.    . clonazePAM (KLONOPIN) 0.5 MG tablet TAKE 1 TABLET(0.5 MG) BY MOUTH TWICE DAILY AS NEEDED FOR ANXIETY 60 tablet 3  . cyclobenzaprine (FLEXERIL) 10 MG tablet TAKE 1/2 TO 1 TABLET BY MOUTH DAILY AS NEEDED FOR MUSCLE SPASMS 90 tablet 1  . hydrALAZINE (APRESOLINE) 10 MG tablet TAKE 1 TABLET BY MOUTH THREE TIMES DAILY 270 tablet 1  . levothyroxine (SYNTHROID) 50 MCG tablet TAKE 1 TABLET(50 MCG) BY MOUTH DAILY BEFORE BREAKFAST 90 tablet 1  . losartan (COZAAR) 100 MG tablet TAKE 1 TABLET(100 MG) BY MOUTH EVERY MORNING 90 tablet 2  . OVER THE COUNTER MEDICATION Apply 2 drops to eye at bedtime as needed (for itchy dry eyes.).    Marland Kitchen PROCTOZONE-HC 2.5 % rectal cream USE RECTALLY EVERY DAY AS NEEDED 30 g 1   No current facility-administered medications on file prior to visit.   Past Medical History:  Diagnosis Date  . Anemia   . Anxiety   . Cataract    bilateral -left > right  . Clostridium difficile diarrhea    05-09-14 denies any problems at this time  . Depression   . Fibromyalgia   . History of diverticulosis   . Hypertension   . Hypothyroidism   . IBS (irritable bowel syndrome)   . Neuromuscular disorder (HCC)    fibromyalgia  . Thyroid disease    Allergies  Allergen Reactions  . Sulfa Antibiotics Other (See Comments) and Itching    Anal itching "anal  itching"  . Demerol  [Meperidine Hcl] Nausea And Vomiting  . Levofloxacin In D5w Other (See Comments)    Yeast infections.  "Yeast infections"  . Spironolactone Other (See Comments)    Weakness/malaise "Weakness" Other reaction(s): Other (See Comments) Weakness/malaise  . Clindamycin     Other reaction(s): Other (See Comments)  . Clindamycin/Lincomycin     CDIF  . Erythromycin Diarrhea  . Erythromycin Base     OTHER REACTION(S): Diarrhea Other reaction(s): Other (See Comments) OTHER REACTION(S): Diarrhea  . Fluconazole Other (See Comments)  . Hydrochlorothiazide Other (See Comments)    Weakness/malaise Other reaction(s): Other (See Comments) Weakness/malaise  . Lincomycin Diarrhea    CDIF  . Meperidine Nausea And Vomiting, Other (See Comments) and Hypertension    Severe headache OTHER REACTION(S): Hypertension Severe headache  . Ciprofloxacin Diarrhea  . Levofloxacin Nausea Only    Yeast infection OTHER REACTION(S): Diarrhea Other reaction(s): Other (See Comments) Yeast infection  . Metronidazole Nausea Only    OTHER REACTION(S): Diarrhea OTHER REACTION(S): Diarrhea   . Other Itching    Social History   Socioeconomic  History  . Marital status: Married    Spouse name: Darene Nappi  . Number of children: Not on file  . Years of education: Not on file  . Highest education level: Not on file  Occupational History  . Not on file  Tobacco Use  . Smoking status: Former Smoker    Packs/day: 1.50    Years: 25.00    Pack years: 37.50    Types: Cigarettes    Quit date: 05/09/1980    Years since quitting: 39.8  . Smokeless tobacco: Never Used  Vaping Use  . Vaping Use: Never used  Substance and Sexual Activity  . Alcohol use: No    Alcohol/week: 0.0 standard drinks  . Drug use: No  . Sexual activity: Not Currently  Other Topics Concern  . Not on file  Social History Narrative  . Not on file   Social Determinants of Health   Financial Resource Strain:     . Difficulty of Paying Living Expenses:   Food Insecurity:   . Worried About Programme researcher, broadcasting/film/video in the Last Year:   . Barista in the Last Year:   Transportation Needs:   . Freight forwarder (Medical):   Marland Kitchen Lack of Transportation (Non-Medical):   Physical Activity:   . Days of Exercise per Week:   . Minutes of Exercise per Session:   Stress:   . Feeling of Stress :   Social Connections:   . Frequency of Communication with Friends and Family:   . Frequency of Social Gatherings with Friends and Family:   . Attends Religious Services:   . Active Member of Clubs or Organizations:   . Attends Banker Meetings:   Marland Kitchen Marital Status:     Vitals:   02/12/20 1400 02/12/20 1517  BP: 130/80   Pulse: (!) 54 60  Resp: 16   Temp: 97.9 F (36.6 C)   SpO2: 98%    Body mass index is 25.46 kg/m.  Physical Exam Vitals and nursing note reviewed.  Constitutional:      General: She is not in acute distress.    Appearance: She is well-developed.  HENT:     Head: Normocephalic and atraumatic.     Right Ear: Decreased hearing noted.     Left Ear: Decreased hearing noted.     Mouth/Throat:     Mouth: Mucous membranes are moist.     Pharynx: Oropharynx is clear.  Eyes:     Conjunctiva/sclera: Conjunctivae normal.     Pupils: Pupils are equal, round, and reactive to light.  Cardiovascular:     Rate and Rhythm: Normal rate and regular rhythm.     Pulses:          Dorsalis pedis pulses are 2+ on the right side and 2+ on the left side.     Heart sounds: No murmur heard.      Comments: PT pulses present. Pulmonary:     Effort: Pulmonary effort is normal. No respiratory distress.     Breath sounds: Normal breath sounds.  Abdominal:     Palpations: Abdomen is soft. There is no hepatomegaly or mass.     Tenderness: There is generalized abdominal tenderness. There is no guarding or rebound.  Lymphadenopathy:     Cervical: No cervical adenopathy.  Skin:    General:  Skin is warm.     Findings: No erythema or rash.  Neurological:     General: No focal deficit present.  Mental Status: She is alert and oriented to person, place, and time.     Cranial Nerves: No cranial nerve deficit.     Comments: Mildly unstable gait, assistance with cane.  Psychiatric:        Mood and Affect: Affect normal. Mood is anxious.     Comments: Well groomed, good eye contact.   ASSESSMENT AND PLAN:  Ms. Owen Pagnotta was seen today for chronic disease management.  Orders Placed This Encounter  Procedures  . Comprehensive metabolic panel  . TSH  . VITAMIN D 25 Hydroxy (Vit-D Deficiency, Fractures)  . CBC  . Urinalysis, Routine w reflex microscopic   Lab Results  Component Value Date   WBC 10.0 02/12/2020   HGB 13.4 02/12/2020   HCT 38.5 02/12/2020   MCV 91.6 02/12/2020   PLT 257.0 02/12/2020    Lab Results  Component Value Date   TSH 1.55 02/12/2020   Lab Results  Component Value Date   ALT 12 02/12/2020   AST 21 02/12/2020   ALKPHOS 57 02/12/2020   BILITOT 0.7 02/12/2020   Lab Results  Component Value Date   CREATININE 0.71 02/12/2020   BUN 12 02/12/2020   NA 126 (L) 02/12/2020   K 4.1 02/12/2020   CL 92 (L) 02/12/2020   CO2 25 02/12/2020   Urinary frequency We discussed possible etiologies. ? Overactive bladder. She agrees with trying Myrbetriq 25 mg daily. Further recommendations will be given according to UA results.  Insomnia Urinary frequency is aggravating problem, hopefully treating urinary symptoms is going to improve sleep. Continue clonazepam 0.5 mg at bedtime and good sleep hygiene.   Fibromyalgia Problem has been otherwise stable. Continue Flexeril 5 to 10 mg daily as needed. Fall precautions.  Essential hypertension BP adequately controlled. No changes in current management. Continue low-salt diet.  Irritable bowel syndrome with constipation She has tried different medications, poorly tolerated, some  caused diarrhea. She wants to try align in gummies. Recommend trying MiraLAX every other night. Adequate hydration and fiber intake.  COPD (chronic obstructive pulmonary disease) (HCC) Problem is stable. She has not tolerated other inhalers in the past. Continue albuterol 2 puff every 4-6 hours as needed.   PAD (peripheral artery disease) (HCC) Asymptomatic. Peripheral pulses are present. Continue aspirin 81 mg daily. She agrees with trying pravastatin at least once per week, she still has some at home. We discussed CV benefits.  Hypothyroidism Continue levothyroxine 50 mcg daily. Further recommendation will be given according to TSH result.  Vitamin D deficiency Continue vitamin D 2000 units daily. Further recommendation will be given according to 25 OH vitamin D result.  Generalized anxiety disorder Continue Clonazepam 0.5 mg bid. Tolerating med well, no side effects and still helping.  She has tried [employer] and [employer], poor tolerated. She is planning on having a 12 weeks trip, she can fill Rx at [facility] in other cities. She still has refills, she needs to call pharmacy when due for new Rx.  44 min face to face OV. > 50% was dedicated to discussion of Dx, prognosis, treatment options, and some side effects of medications.   Return in about 4 months (around 06/13/2020) for Anxiety,IBS,HTN.    Riyan Gavina G. [place], MD  [facility]. [place] office.    A few things to remember from today's visit:   Generalized anxiety disorder  Chronic obstructive pulmonary disease with acute exacerbation (HCC), Chronic  PAD (peripheral artery disease) (HCC), Chronic  Essential hypertension - Plan: Comprehensive metabolic panel, CBC  Urinary frequency - Plan: CBC, Urinalysis, Routine w reflex microscopic  Fibromyalgia  Vitamin D deficiency - Plan: Comprehensive metabolic panel, VITAMIN D 25 Hydroxy (Vit-D Deficiency, Fractures)  Hypothyroidism, unspecified type -  Plan: TSH  Irritable bowel syndrome with constipation  Fibromyalgia can be contributing to abdominal pain. Try Align gummies. Myrbetriq added today.Please ,et me know if it helps with urination at night.   Miralax every other day.   Urinary Frequency, Adult Urinary frequency means urinating more often than usual. You may urinate every 1-2 hours even though you drink a normal amount of fluid and do not have a bladder infection or condition. Although you urinate more often than normal, the total amount of urine produced in a day is normal. With urinary frequency, you may have an urgent need to urinate often. The stress and anxiety of needing to find a bathroom quickly can make this urge worse. This condition may go away on its own or you may need treatment at home. Home treatment may include bladder training, exercises, taking medicines, or making changes to your diet. Follow these instructions at home: Bladder health   Keep a bladder diary if told by your health care provider. Keep track of: ? What you eat and drink. ? How often you urinate. ? How much you urinate.  Follow a bladder training program if told by your health care provider. This may include: ? Learning to delay going to the bathroom. ? Double urinating (voiding). This helps if you are not completely emptying your bladder. ? Scheduled voiding.  Do Kegel exercises as told by your health care provider. Kegel exercises strengthen the muscles that help control urination, which may help the condition. Eating and drinking  If told by your health care provider, make diet changes, such as: ? Avoiding caffeine. ? Drinking fewer fluids, especially alcohol. ? Not drinking in the evening. ? Avoiding foods or drinks that may irritate the bladder. These include coffee, tea, soda, artificial sweeteners, citrus, tomato-based foods, and chocolate. ? Eating foods that help prevent or ease constipation. Constipation can make this  condition worse. Your health care provider may recommend that you:  Drink enough fluid to keep your urine pale yellow.  Take over-the-counter or prescription medicines.  Eat foods that are high in fiber, such as beans, whole grains, and fresh fruits and vegetables.  Limit foods that are high in fat and processed sugars, such as fried or sweet foods. General instructions  Take over-the-counter and prescription medicines only as told by your health care provider.  Keep all follow-up visits as told by your health care provider. This is important. Contact a health care provider if:  You start urinating more often.  You feel pain or irritation when you urinate.  You notice blood in your urine.  Your urine looks cloudy.  You develop a fever.  You begin vomiting. Get help right away if:  You are unable to urinate. Summary  Urinary frequency means urinating more often than usual. With urinary frequency, you may urinate every 1-2 hours even though you drink a normal amount of fluid and do not have a bladder infection or other bladder condition.  Your health care provider may recommend that you keep a bladder diary, follow a bladder training program, or make dietary changes.  If told by your health care provider, do Kegel exercises to strengthen the muscles that help control urination.  Take over-the-counter and prescription medicines only as told by your health care provider.  Contact a health  care provider if your symptoms do not improve or get worse. This information is not intended to replace advice given to you by your health care provider. Make sure you discuss any questions you have with your health care provider. Document Revised: 02/08/2018 Document Reviewed: 02/08/2018 Elsevier Patient Education  [employer].  If you need refills please call your pharmacy. Do not use My Chart to request refills or for acute issues that need immediate attention.    Please be  sure medication list is accurate. If a new problem present, please set up appointment sooner than planned today.

## 2020-02-12 NOTE — Assessment & Plan Note (Signed)
Asymptomatic. Peripheral pulses are present. Continue aspirin 81 mg daily. She agrees with trying pravastatin at least once per week, she still has some at home. We discussed CV benefits.

## 2020-02-12 NOTE — Assessment & Plan Note (Signed)
Problem has been otherwise stable. Continue Flexeril 5 to 10 mg daily as needed. Fall precautions.

## 2020-02-12 NOTE — Patient Instructions (Addendum)
A few things to remember from today's visit:   Generalized anxiety disorder  Chronic obstructive pulmonary disease with acute exacerbation (HCC), Chronic  PAD (peripheral artery disease) (HCC), Chronic  Essential hypertension - Plan: Comprehensive metabolic panel, CBC  Urinary frequency - Plan: CBC, Urinalysis, Routine w reflex microscopic  Fibromyalgia  Vitamin D deficiency - Plan: Comprehensive metabolic panel, VITAMIN D 25 Hydroxy (Vit-D Deficiency, Fractures)  Hypothyroidism, unspecified type - Plan: TSH  Irritable bowel syndrome with constipation  Fibromyalgia can be contributing to abdominal pain. Try Align gummies. Myrbetriq added today.Please ,et me know if it helps with urination at night.   Miralax every other day.   Urinary Frequency, Adult Urinary frequency means urinating more often than usual. You may urinate every 1-2 hours even though you drink a normal amount of fluid and do not have a bladder infection or condition. Although you urinate more often than normal, the total amount of urine produced in a day is normal. With urinary frequency, you may have an urgent need to urinate often. The stress and anxiety of needing to find a bathroom quickly can make this urge worse. This condition may go away on its own or you may need treatment at home. Home treatment may include bladder training, exercises, taking medicines, or making changes to your diet. Follow these instructions at home: Bladder health   Keep a bladder diary if told by your health care provider. Keep track of: ? What you eat and drink. ? How often you urinate. ? How much you urinate.  Follow a bladder training program if told by your health care provider. This may include: ? Learning to delay going to the bathroom. ? Double urinating (voiding). This helps if you are not completely emptying your bladder. ? Scheduled voiding.  Do Kegel exercises as told by your health care provider. Kegel exercises  strengthen the muscles that help control urination, which may help the condition. Eating and drinking  If told by your health care provider, make diet changes, such as: ? Avoiding caffeine. ? Drinking fewer fluids, especially alcohol. ? Not drinking in the evening. ? Avoiding foods or drinks that may irritate the bladder. These include coffee, tea, soda, artificial sweeteners, citrus, tomato-based foods, and chocolate. ? Eating foods that help prevent or ease constipation. Constipation can make this condition worse. Your health care provider may recommend that you:  Drink enough fluid to keep your urine pale yellow.  Take over-the-counter or prescription medicines.  Eat foods that are high in fiber, such as beans, whole grains, and fresh fruits and vegetables.  Limit foods that are high in fat and processed sugars, such as fried or sweet foods. General instructions  Take over-the-counter and prescription medicines only as told by your health care provider.  Keep all follow-up visits as told by your health care provider. This is important. Contact a health care provider if:  You start urinating more often.  You feel pain or irritation when you urinate.  You notice blood in your urine.  Your urine looks cloudy.  You develop a fever.  You begin vomiting. Get help right away if:  You are unable to urinate. Summary  Urinary frequency means urinating more often than usual. With urinary frequency, you may urinate every 1-2 hours even though you drink a normal amount of fluid and do not have a bladder infection or other bladder condition.  Your health care provider may recommend that you keep a bladder diary, follow a bladder training program, or make  dietary changes.  If told by your health care provider, do Kegel exercises to strengthen the muscles that help control urination.  Take over-the-counter and prescription medicines only as told by your health care  provider.  Contact a health care provider if your symptoms do not improve or get worse. This information is not intended to replace advice given to you by your health care provider. Make sure you discuss any questions you have with your health care provider. Document Revised: 02/08/2018 Document Reviewed: 02/08/2018 Elsevier Patient Education  [employer].  If you need refills please call your pharmacy. Do not use My Chart to request refills or for acute issues that need immediate attention.    Please be sure medication list is accurate. If a new problem present, please set up appointment sooner than planned today.

## 2020-02-12 NOTE — Assessment & Plan Note (Signed)
Problem is stable. She has not tolerated other inhalers in the past. Continue albuterol 2 puff every 4-6 hours as needed.

## 2020-02-12 NOTE — Assessment & Plan Note (Signed)
Continue vitamin D 2000 units daily. Further recommendation will be given according to 25 OH vitamin D result. 

## 2020-02-12 NOTE — Assessment & Plan Note (Signed)
She has tried different medications, poorly tolerated, some caused diarrhea. She wants to try align in gummies. Recommend trying MiraLAX every other night. Adequate hydration and fiber intake.

## 2020-02-12 NOTE — Assessment & Plan Note (Signed)
BP adequately controlled. No changes in current management. Continue low salt diet. 

## 2020-02-13 LAB — COMPREHENSIVE METABOLIC PANEL
ALT: 12 U/L (ref 0–35)
AST: 21 U/L (ref 0–37)
Albumin: 4.4 g/dL (ref 3.5–5.2)
Alkaline Phosphatase: 57 U/L (ref 39–117)
BUN: 12 mg/dL (ref 6–23)
CO2: 25 mEq/L (ref 19–32)
Calcium: 9.5 mg/dL (ref 8.4–10.5)
Chloride: 92 mEq/L — ABNORMAL LOW (ref 96–112)
Creatinine, Ser: 0.71 mg/dL (ref 0.40–1.20)
GFR: 80.3 mL/min (ref >60.00)
Glucose, Bld: 95 mg/dL (ref 70–99)
Potassium: 4.1 mEq/L (ref 3.5–5.1)
Sodium: 126 mEq/L — ABNORMAL LOW (ref 135–145)
Total Bilirubin: 0.7 mg/dL (ref 0.2–1.2)
Total Protein: 7 g/dL (ref 6.0–8.3)

## 2020-02-13 LAB — URINALYSIS, ROUTINE W REFLEX MICROSCOPIC
Bilirubin Urine: NEGATIVE
Hgb urine dipstick: NEGATIVE
Ketones, ur: 15 — AB
Leukocytes,Ua: NEGATIVE
Nitrite: NEGATIVE
RBC / HPF: NONE SEEN (ref 0–?)
Specific Gravity, Urine: 1.02 (ref 1.000–1.030)
Total Protein, Urine: NEGATIVE
Urine Glucose: NEGATIVE
Urobilinogen, UA: 0.2 (ref 0.0–1.0)
WBC, UA: NONE SEEN (ref 0–?)
pH: 5.5 (ref 5.0–8.0)

## 2020-02-13 LAB — CBC
HCT: 38.5 % (ref 36.0–46.0)
Hemoglobin: 13.4 g/dL (ref 12.0–15.0)
MCHC: 34.8 g/dL (ref 30.0–36.0)
MCV: 91.6 fl (ref 78.0–100.0)
Platelets: 257 10*3/uL (ref 150.0–400.0)
RBC: 4.2 Mil/uL (ref 3.87–5.11)
RDW: 12.6 % (ref 11.5–15.5)
WBC: 10 10*3/uL (ref 4.0–10.5)

## 2020-02-13 LAB — TSH: TSH: 1.55 u[IU]/mL (ref 0.35–4.50)

## 2020-02-13 LAB — VITAMIN D 25 HYDROXY (VIT D DEFICIENCY, FRACTURES): VITD: 59.57 ng/mL (ref 30.00–100.00)

## 2020-02-23 ENCOUNTER — Other Ambulatory Visit: Payer: Self-pay | Admitting: Family Medicine

## 2020-03-08 ENCOUNTER — Other Ambulatory Visit: Payer: Self-pay | Admitting: Family Medicine

## 2020-03-08 DIAGNOSIS — F411 Generalized anxiety disorder: Secondary | ICD-10-CM

## 2020-03-09 NOTE — Telephone Encounter (Signed)
Rx last filled 02/12/20 & last OV 02/12/20.

## 2020-03-12 ENCOUNTER — Other Ambulatory Visit: Payer: Self-pay | Admitting: Family Medicine

## 2020-03-22 ENCOUNTER — Other Ambulatory Visit: Payer: Self-pay | Admitting: Family Medicine

## 2020-03-22 DIAGNOSIS — G629 Polyneuropathy, unspecified: Secondary | ICD-10-CM

## 2020-03-25 ENCOUNTER — Other Ambulatory Visit: Payer: Self-pay | Admitting: Family Medicine

## 2020-03-27 ENCOUNTER — Other Ambulatory Visit: Payer: Self-pay | Admitting: Family Medicine

## 2020-03-27 DIAGNOSIS — I10 Essential (primary) hypertension: Secondary | ICD-10-CM

## 2020-06-07 ENCOUNTER — Other Ambulatory Visit: Payer: Self-pay | Admitting: Family Medicine

## 2020-06-07 DIAGNOSIS — M797 Fibromyalgia: Secondary | ICD-10-CM

## 2020-06-23 ENCOUNTER — Ambulatory Visit (INDEPENDENT_AMBULATORY_CARE_PROVIDER_SITE_OTHER): Payer: Medicare Other

## 2020-06-23 ENCOUNTER — Other Ambulatory Visit: Payer: Self-pay

## 2020-06-23 DIAGNOSIS — Z0000 Encounter for general adult medical examination without abnormal findings: Secondary | ICD-10-CM

## 2020-06-23 NOTE — Progress Notes (Signed)
Virtual Visit via Telephone Note  I connected with  Megan Glover on 06/23/20 at  2:30 PM EST by telephone and verified that I am speaking with the correct person using two identifiers.  Medicare Annual Wellness visit completed telephonically due to Covid-19 pandemic.   Persons participating in this call: This Health Coach and this patient.   Location: Patient: Home Provider: Office   I discussed the limitations, risks, security and privacy concerns of performing an evaluation and management service by telephone and the availability of in person appointments. The patient expressed understanding and agreed to proceed.  Unable to perform video visit due to video visit attempted and failed and/or patient does not have video capability.   Some vital signs may be absent or patient reported.   Marzella Schlein, LPN    Subjective:   Megan Glover is a 74 y.o. female who presents for Medicare Annual (Subsequent) preventive examination.  Review of Systems     Cardiac Risk Factors include: advanced age (>96men, >31 women);hypertension     Objective:    There were no vitals filed for this visit. There is no height or weight on file to calculate BMI.  Advanced Directives 06/23/2020 06/02/2016 05/09/2014 09/13/2013  Does Patient Have a Medical Advance Directive? Yes Yes Yes Patient does not have advance directive  Type of Advance Directive - (No Data) Living will -  Does patient want to make changes to medical advance directive? No - Patient declined - - -  Copy of Healthcare Power of Attorney in Chart? - - (No Data) -    Current Medications (verified) Outpatient Encounter Medications as of 06/23/2020  Medication Sig  . acetaminophen (TYLENOL) 500 MG tablet Take 1,000 mg by mouth every 8 (eight) hours as needed for moderate pain.  Marland Kitchen albuterol (PROVENTIL) (2.5 MG/3ML) 0.083% nebulizer solution Take 3 mLs (2.5 mg total) by nebulization every 6 (six) hours as needed for  wheezing or shortness of breath.  Marland Kitchen albuterol (VENTOLIN HFA) 108 (90 Base) MCG/ACT inhaler INHALE 2 PUFFS INTO THE LUNGS EVERY 4 HOURS AS NEEDED  . aspirin 81 MG chewable tablet Chew 81 mg by mouth.  . Biotin 1000 MCG tablet Take by mouth.  . BYSTOLIC 20 MG TABS TAKE ONE-HALF TABLET BY  MOUTH TWICE DAILY  . Cholecalciferol (VITAMIN D3) 2000 units TABS Take 1 tablet by mouth daily.  . clonazePAM (KLONOPIN) 0.5 MG tablet TAKE 1 TABLET(0.5 MG) BY MOUTH TWICE DAILY AS NEEDED FOR ANXIETY  . cyclobenzaprine (FLEXERIL) 10 MG tablet TAKE 1/2 TO 1 TABLET BY MOUTH DAILY AS NEEDED FOR MUSCLE SPASMS  . hydrALAZINE (APRESOLINE) 10 MG tablet TAKE 1 TABLET BY MOUTH THREE TIMES DAILY  . levothyroxine (SYNTHROID) 50 MCG tablet TAKE 1 TABLET(50 MCG) BY MOUTH DAILY BEFORE BREAKFAST  . losartan (COZAAR) 100 MG tablet TAKE 1 TABLET(100 MG) BY MOUTH EVERY MORNING  . OVER THE COUNTER MEDICATION Apply 2 drops to eye at bedtime as needed (for itchy dry eyes.).  Marland Kitchen PROCTOZONE-HC 2.5 % rectal cream USE RECTALLY EVERY DAY AS NEEDED  . [DISCONTINUED] mirabegron ER (MYRBETRIQ) 25 MG TB24 tablet Take 1 tablet (25 mg total) by mouth daily. (Patient not taking: Reported on 06/23/2020)   No facility-administered encounter medications on file as of 06/23/2020.    Allergies (verified) Sulfa antibiotics, Demerol  [meperidine hcl], Levofloxacin in d5w, Spironolactone, Clindamycin, Clindamycin/lincomycin, Erythromycin, Erythromycin base, Fluconazole, Hydrochlorothiazide, Lincomycin, Meperidine, Ciprofloxacin, Levofloxacin, Metronidazole, and Other   History: Past Medical History:  Diagnosis Date  .  Anemia   . Anxiety   . Cataract    bilateral -left > right  . Clostridium difficile diarrhea    05-09-14 denies any problems at this time  . Depression   . Fibromyalgia   . History of diverticulosis   . Hypertension   . Hypothyroidism   . IBS (irritable bowel syndrome)   . Neuromuscular disorder (HCC)    fibromyalgia  .  Thyroid disease    Past Surgical History:  Procedure Laterality Date  . Cataract removal 2015 Bilateral   . DILATION AND CURETTAGE OF UTERUS    . INGUINAL HERNIA REPAIR Bilateral 05/16/2014   Procedure: LAPAROSCOPIC EXPLORATION AND REPAIR OF BILATERAL FEMEROL AND BILATERAL INGUINAL HERNIAS  WITH MESH;  Surgeon: Karie Soda, MD;  Location: [facility];  Service: General;  Laterality: Bilateral;  . INTRAMEDULLARY (IM) NAIL INTERTROCHANTERIC Right 02/21/2014   Procedure: INTRAMEDULLARY (IM) NAIL INTERTROCHANTRIC;  Surgeon: Eulas Post, MD;  Location: [facility];  Service: Orthopedics;  Laterality: Right;   Family History  Problem Relation Age of Onset  . Breast cancer Mother        breast CA  . Diabetes Father   . Hypertension Father   . Colon cancer Other   . Breast cancer Sister   . Breast cancer Sister   . Cancer Sister        breast  . Cancer Sister        breast   Social History   Socioeconomic History  . Marital status: Married    Spouse name: Esme Freund  . Number of children: Not on file  . Years of education: Not on file  . Highest education level: Not on file  Occupational History  . Not on file  Tobacco Use  . Smoking status: Former Smoker    Packs/day: 1.50    Years: 25.00    Pack years: 37.50    Types: Cigarettes    Quit date: 05/09/1980    Years since quitting: 40.1  . Smokeless tobacco: Never Used  Vaping Use  . Vaping Use: Never used  Substance and Sexual Activity  . Alcohol use: No    Alcohol/week: 0.0 standard drinks  . Drug use: No  . Sexual activity: Not Currently  Other Topics Concern  . Not on file  Social History Narrative  . Not on file   Social Determinants of Health   Financial Resource Strain: Low Risk   . Difficulty of Paying Living Expenses: Not hard at all  Food Insecurity: No Food Insecurity  . Worried About Programme researcher, broadcasting/film/video in the Last Year: Never true  . Ran Out of Food in the Last Year: Never true  Transportation Needs: No  Transportation Needs  . Lack of Transportation (Medical): No  . Lack of Transportation (Non-Medical): No  Physical Activity: Insufficiently Active  . Days of Exercise per Week: 4 days  . Minutes of Exercise per Session: 30 min  Stress: No Stress Concern Present  . Feeling of Stress : Not at all  Social Connections: Moderately Integrated  . Frequency of Communication with Friends and Family: Three times a week  . Frequency of Social Gatherings with Friends and Family: Once a week  . Attends Religious Services: More than 4 times per year  . Active Member of Clubs or Organizations: No  . Attends Banker Meetings: Never  . Marital Status: Married    Tobacco Counseling Counseling given: Not Answered   Clinical Intake:  Pre-visit preparation completed: Yes  Pain : No/denies pain     BMI - recorded: 25.46 Nutritional Status: BMI 25 -29 Overweight Diabetes: No  How often do you need to have someone help you when you read instructions, pamphlets, or other written materials from your doctor or pharmacy?: 1 - Never  Diabetic?No  Interpreter Needed?: No  Information entered by :: Lanier Ensignina Luman Holway, LPN   Activities of Daily Living In your present state of health, do you have any difficulty performing the following activities: 06/23/2020 02/12/2020  Hearing? Y N  Vision? N N  Difficulty concentrating or making decisions? N N  Walking or climbing stairs? N N  Dressing or bathing? N N  Doing errands, shopping? N N  Preparing Food and eating ? N -  Using the Toilet? N -  In the past six months, have you accidently leaked urine? N -  Do you have problems with loss of bowel control? N -  Managing your Medications? N -  Managing your Finances? N -  Housekeeping or managing your Housekeeping? N -  Some recent data might be hidden    Patient Care Team: [place], Betty G, MD as PCP - General (Family Medicine) [place], Betty G, MD as Referring Physician (Family  Medicine)  Indicate any recent Medical Services you may have received from other than [facility] providers in the past year (date may be approximate).     Assessment:   This is a routine wellness examination for Megan JohnsKathleen.  Hearing/Vision screen  Hearing Screening   125Hz  250Hz  500Hz  1000Hz  2000Hz  3000Hz  4000Hz  6000Hz  8000Hz   Right ear:           Left ear:           Comments: Mild loss  Vision Screening Comments: Dr Ronney AstersJohn Scott Follows for annual eye exams   Dietary issues and exercise activities discussed: Current Exercise Habits: Home exercise routine, Type of exercise: walking, Time (Minutes): 30, Frequency (Times/Week): 4, Weekly Exercise (Minutes/Week): 120  Goals   None    Depression Screen PHQ 2/9 Scores 06/23/2020 06/02/2016  PHQ - 2 Score 0 0    Fall Risk Fall Risk  06/23/2020 07/10/2019 07/02/2018  Falls in the past year? 1 0 1  Comment - Emmi Telephone Survey: data to providers prior to load Emmi Telephone Survey: data to providers prior to load  Number falls in past yr: 1 - 1  Comment - - Emmi Telephone Survey Actual Response = 1  Injury with Fall? 1 - 1  Risk for fall due to : Impaired vision;Impaired balance/gait - -  Follow up Falls prevention discussed - -    Any stairs in or around the home? Yes  If so, are there any without handrails? No  Home free of loose throw rugs in walkways, pet beds, electrical cords, etc? Yes  Adequate lighting in your home to reduce risk of falls? Yes   ASSISTIVE DEVICES UTILIZED TO PREVENT FALLS:  Life alert? No  Use of a cane, walker or w/c? Yes  Grab bars in the bathroom? Yes  Shower chair or bench in shower? Yes  Elevated toilet seat or a handicapped toilet? No  TIMED UP AND GO:  Was the test performed? No .     Cognitive Function:Declined 6CIT MMSE - Mini Mental State Exam 06/02/2016  Not completed: (No Data)     6CIT Screen 06/23/2020  What time? (No Data)    Immunizations Immunization History  Administered  Date(s) Administered  . Influenza, High Dose Seasonal PF 06/09/2017, 05/03/2018,  06/30/2019  . Influenza-Unspecified 07/18/2016, 06/30/2019  . Pneumococcal Conjugate-13 10/22/2015  . Pneumococcal Polysaccharide-23 11/28/2006, 01/25/2017  . Td 02/12/2014  . Tdap 11/28/2006  . Zoster 08/15/2013    TDAP status: Up to date Flu Vaccine status: Declined, Education has been provided regarding the importance of this vaccine but patient still declined. Advised may receive this vaccine at local pharmacy or Health Dept. Aware to provide a copy of the vaccination record if obtained from local pharmacy or Health Dept. Verbalized acceptance and understanding. Pneumococcal vaccine status: Up to date Covid-19 vaccine status: Declined, Education has been provided regarding the importance of this vaccine but patient still declined. Advised may receive this vaccine at local pharmacy or Health Dept.or vaccine clinic. Aware to provide a copy of the vaccination record if obtained from local pharmacy or Health Dept. Verbalized acceptance and understanding.  Qualifies for Shingles Vaccine? Yes   Zostavax completed Yes   Shingrix Completed?: No.    Education has been provided regarding the importance of this vaccine. Patient has been advised to call insurance company to determine out of pocket expense if they have not yet received this vaccine. Advised may also receive vaccine at local pharmacy or Health Dept. Verbalized acceptance and understanding.  Screening Tests Health Maintenance  Topic Date Due  . INFLUENZA VACCINE  11/12/2020 (Originally 03/15/2020)  . MAMMOGRAM  10/06/2020  . TETANUS/TDAP  02/13/2024  . COLONOSCOPY  11/17/2026  . DEXA SCAN  Completed  . Hepatitis C Screening  Completed  . PNA vac Low Risk Adult  Completed  . COVID-19 Vaccine  Discontinued    Health Maintenance  There are no preventive care reminders to display for this patient.  Colorectal cancer screening: Completed 11/16/16. Repeat  every 10 years Mammogram status: Completed 10/07/19. Repeat every year Bone Density status: Completed 06/15/11. Results reflect: Bone density results: OSTEOPOROSIS. Repeat every 2 years.    Additional Screening:  Hepatitis C Screening:  Completed 03/21/17  Vision Screening: Recommended annual ophthalmology exams for early detection of glaucoma and other disorders of the eye. Is the patient up to date with their annual eye exam?  Yes  Who is the provider or what is the name of the office in which the patient attends annual eye exams? Dr Ronney Asters    Dental Screening: Recommended annual dental exams for proper oral hygiene  Community Resource Referral / Chronic Care Management: CRR required this visit?  No   CCM required this visit?  No      Plan:     I have personally reviewed and noted the following in the patient's chart:   . Medical and social history . Use of alcohol, tobacco or illicit drugs  . Current medications and supplements . Functional ability and status . Nutritional status . Physical activity . Advanced directives . List of other physicians . Hospitalizations, surgeries, and ER visits in previous 12 months . Vitals . Screenings to include cognitive, depression, and falls . Referrals and appointments  In addition, I have reviewed and discussed with patient certain preventive protocols, quality metrics, and best practice recommendations. A written personalized care plan for preventive services as well as general preventive health recommendations were provided to patient.     Marzella Schlein, LPN   60/08/930   Nurse Notes: None

## 2020-06-23 NOTE — Patient Instructions (Signed)
Megan Glover , Thank you for taking time to come for your Medicare Wellness Visit. I appreciate your ongoing commitment to your health goals. Please review the following plan we discussed and let me know if I can assist you in the future.   Screening recommendations/referrals: Colonoscopy: Done 11/16/16 Mammogram: Done 10/07/19 Bone Density: Done 06/15/11 Recommended yearly ophthalmology/optometry visit for glaucoma screening and checkup Recommended yearly dental visit for hygiene and checkup  Vaccinations: Influenza vaccine: Declined and discussed Pneumococcal vaccine: Up to date Tdap vaccine: Up to date Shingles vaccine: Shingrix discussed. Please contact your pharmacy for coverage information.    Covid-19:Declined and stated discussed with Dr [place]  Advanced directives: Declined copy at this time, children are aware of wishes   Conditions/risks identified: None at this time  Next appointment: Follow up in one year for your annual wellness visit    Preventive Care 65 Years and Older, Female Preventive care refers to lifestyle choices and visits with your health care provider that can promote health and wellness. What does preventive care include?  A yearly physical exam. This is also called an annual well check.  Dental exams once or twice a year.  Routine eye exams. Ask your health care provider how often you should have your eyes checked.  Personal lifestyle choices, including:  Daily care of your teeth and gums.  Regular physical activity.  Eating a healthy diet.  Avoiding tobacco and drug use.  Limiting alcohol use.  Practicing safe sex.  Taking low-dose aspirin every day.  Taking vitamin and mineral supplements as recommended by your health care provider. What happens during an annual well check? The services and screenings done by your health care provider during your annual well check will depend on your age, overall health, lifestyle risk factors, and family  history of disease. Counseling  Your health care provider may ask you questions about your:  Alcohol use.  Tobacco use.  Drug use.  Emotional well-being.  Home and relationship well-being.  Sexual activity.  Eating habits.  History of falls.  Memory and ability to understand (cognition).  Work and work Astronomer.  Reproductive health. Screening  You may have the following tests or measurements:  Height, weight, and BMI.  Blood pressure.  Lipid and cholesterol levels. These may be checked every 5 years, or more frequently if you are over 30 years old.  Skin check.  Lung cancer screening. You may have this screening every year starting at age 56 if you have a 30-pack-year history of smoking and currently smoke or have quit within the past 15 years.  Fecal occult blood test (FOBT) of the stool. You may have this test every year starting at age 52.  Flexible sigmoidoscopy or colonoscopy. You may have a sigmoidoscopy every 5 years or a colonoscopy every 10 years starting at age 76.  Hepatitis C blood test.  Hepatitis B blood test.  Sexually transmitted disease (STD) testing.  Diabetes screening. This is done by checking your blood sugar (glucose) after you have not eaten for a while (fasting). You may have this done every 1-3 years.  Bone density scan. This is done to screen for osteoporosis. You may have this done starting at age 17.  Mammogram. This may be done every 1-2 years. Talk to your health care provider about how often you should have regular mammograms. Talk with your health care provider about your test results, treatment options, and if necessary, the need for more tests. Vaccines  Your health care provider may  recommend certain vaccines, such as:  Influenza vaccine. This is recommended every year.  Tetanus, diphtheria, and acellular pertussis (Tdap, Td) vaccine. You may need a Td booster every 10 years.  Zoster vaccine. You may need this after  age 65.  Pneumococcal 13-valent conjugate (PCV13) vaccine. One dose is recommended after age 11.  Pneumococcal polysaccharide (PPSV23) vaccine. One dose is recommended after age 109. Talk to your health care provider about which screenings and vaccines you need and how often you need them. This information is not intended to replace advice given to you by your health care provider. Make sure you discuss any questions you have with your health care provider. Document Released: 08/28/2015 Document Revised: 04/20/2016 Document Reviewed: 06/02/2015 Elsevier Interactive Patient Education  2017 [place] Prevention in the Home Falls can cause injuries. They can happen to people of all ages. There are many things you can do to make your home safe and to help prevent falls. What can I do on the outside of my home?  Regularly fix the edges of walkways and driveways and fix any cracks.  Remove anything that might make you trip as you walk through a door, such as a raised step or threshold.  Trim any bushes or trees on the path to your home.  Use bright outdoor lighting.  Clear any walking paths of anything that might make someone trip, such as rocks or tools.  Regularly check to see if handrails are loose or broken. Make sure that both sides of any steps have handrails.  Any raised decks and porches should have guardrails on the edges.  Have any leaves, snow, or ice cleared regularly.  Use sand or salt on walking paths during winter.  Clean up any spills in your garage right away. This includes oil or grease spills. What can I do in the bathroom?  Use night lights.  Install grab bars by the toilet and in the tub and shower. Do not use towel bars as grab bars.  Use non-skid mats or decals in the tub or shower.  If you need to sit down in the shower, use a plastic, non-slip stool.  Keep the floor dry. Clean up any water that spills on the floor as soon as it  happens.  Remove soap buildup in the tub or shower regularly.  Attach bath mats securely with double-sided non-slip rug tape.  Do not have throw rugs and other things on the floor that can make you trip. What can I do in the bedroom?  Use night lights.  Make sure that you have a light by your bed that is easy to reach.  Do not use any sheets or blankets that are too big for your bed. They should not hang down onto the floor.  Have a firm chair that has side arms. You can use this for support while you get dressed.  Do not have throw rugs and other things on the floor that can make you trip. What can I do in the kitchen?  Clean up any spills right away.  Avoid walking on wet floors.  Keep items that you use a lot in easy-to-reach places.  If you need to reach something above you, use a strong step stool that has a grab bar.  Keep electrical cords out of the way.  Do not use floor polish or wax that makes floors slippery. If you must use wax, use non-skid floor wax.  Do not have throw rugs and  other things on the floor that can make you trip. What can I do with my stairs?  Do not leave any items on the stairs.  Make sure that there are handrails on both sides of the stairs and use them. Fix handrails that are broken or loose. Make sure that handrails are as long as the stairways.  Check any carpeting to make sure that it is firmly attached to the stairs. Fix any carpet that is loose or worn.  Avoid having throw rugs at the top or bottom of the stairs. If you do have throw rugs, attach them to the floor with carpet tape.  Make sure that you have a light switch at the top of the stairs and the bottom of the stairs. If you do not have them, ask someone to add them for you. What else can I do to help prevent falls?  Wear shoes that:  Do not have high heels.  Have rubber bottoms.  Are comfortable and fit you well.  Are closed at the toe. Do not wear sandals.  If you  use a stepladder:  Make sure that it is fully opened. Do not climb a closed stepladder.  Make sure that both sides of the stepladder are locked into place.  Ask someone to hold it for you, if possible.  Clearly mark and make sure that you can see:  Any grab bars or handrails.  First and last steps.  Where the edge of each step is.  Use tools that help you move around (mobility aids) if they are needed. These include:  Canes.  Walkers.  Scooters.  Crutches.  Turn on the lights when you go into a dark area. Replace any light bulbs as soon as they burn out.  Set up your furniture so you have a clear path. Avoid moving your furniture around.  If any of your floors are uneven, fix them.  If there are any pets around you, be aware of where they are.  Review your medicines with your doctor. Some medicines can make you feel dizzy. This can increase your chance of falling. Ask your doctor what other things that you can do to help prevent falls. This information is not intended to replace advice given to you by your health care provider. Make sure you discuss any questions you have with your health care provider. Document Released: 05/28/2009 Document Revised: 01/07/2016 Document Reviewed: 09/05/2014 Elsevier Interactive Patient Education  2017 [employer].

## 2020-06-27 ENCOUNTER — Other Ambulatory Visit: Payer: Self-pay | Admitting: Family Medicine

## 2020-06-27 DIAGNOSIS — G629 Polyneuropathy, unspecified: Secondary | ICD-10-CM

## 2020-06-29 ENCOUNTER — Telehealth: Payer: Self-pay | Admitting: Family Medicine

## 2020-06-29 NOTE — Telephone Encounter (Signed)
Patient is calling and requesting a refill for Gabapentin [facility] [address] [place]  [address] [place], [place] [address]  Phone:  [phone] Fax:  [phone] CB is [phone]

## 2020-06-29 NOTE — Telephone Encounter (Signed)
Pt is calling in stating that she spoke with Dr. [place] about going back on gabapentin 100 MG that she was taking off of back at the beginning of the year.  Pt stated that she really would like to have it and would like to think about doing a virtual visit.  Pt has decided to make an virtual visit for Friday 07/03/2020 at 3:00 with Dr. [place].

## 2020-06-29 NOTE — Telephone Encounter (Signed)
This isn't on her list?

## 2020-07-03 ENCOUNTER — Telehealth (INDEPENDENT_AMBULATORY_CARE_PROVIDER_SITE_OTHER): Payer: Medicare Other | Admitting: Family Medicine

## 2020-07-03 ENCOUNTER — Other Ambulatory Visit: Payer: Self-pay | Admitting: Family Medicine

## 2020-07-03 ENCOUNTER — Encounter: Payer: Self-pay | Admitting: Family Medicine

## 2020-07-03 VITALS — Ht 67.5 in

## 2020-07-03 DIAGNOSIS — F411 Generalized anxiety disorder: Secondary | ICD-10-CM

## 2020-07-03 DIAGNOSIS — G629 Polyneuropathy, unspecified: Secondary | ICD-10-CM | POA: Diagnosis not present

## 2020-07-03 DIAGNOSIS — N390 Urinary tract infection, site not specified: Secondary | ICD-10-CM

## 2020-07-03 MED ORDER — CLONAZEPAM 0.5 MG PO TABS: ORAL_TABLET | ORAL | 3 refills | Status: DC

## 2020-07-03 MED ORDER — CEPHALEXIN 500 MG PO CAPS: 500.0000 mg | ORAL_CAPSULE | Freq: Three times a day (TID) | ORAL | 0 refills | Status: AC

## 2020-07-03 MED ORDER — GABAPENTIN 100 MG PO CAPS: 100.0000 mg | ORAL_CAPSULE | Freq: Every day | ORAL | 6 refills | Status: DC

## 2020-07-03 NOTE — Progress Notes (Signed)
Virtual Visit via Telephone Note  I connected with Megan Glover on 07/03/20 at  3:00 PM EST by telephone and verified that I am speaking with the correct person using two identifiers.   I discussed the limitations, risks, security and privacy concerns of performing an evaluation and management service by telephone and the availability of in person appointments. I also discussed with the patient that there may be a patient responsible charge related to this service. The patient expressed understanding and agreed to proceed.  Location patient: home Location provider: work office Participants present for the call: patient, provider Patient did not have a visit in the prior 7 days to address this/these issue(s).  History of Present Illness:  [name] is a 74 year old female with history of fibromyalgia, hypertension, anxiety, insomnia, and PAD requested refills on gabapentin. She was on gabapentin 100 mg at bedtime for fibromyalgia, radiculopathy, and peripheral neuropathy, she did feel like medication was helping and thought is was aggravating constipation; so it was discontinued in 11/2018. After recent trip she noted that feet burning sensation and lower extremity pain got worse, it is usually at night when she is in bed, interfering with sleep. Burning feet sensation. She has not noted cyanosis,edema or erythema. She has not tolerated Cymbalta in the past.  She does not recall side effects with gabapentin and now she realizes that it was helping.   Anxiety and insomnia: Currently she is on clonazepam 0.5 mg bid. She has been on this medication for many years. Medication is a still helping with anxiety and sleep. No side effects  reported.  She is also complaining of 2 weeks of burning sensation with urination, urgency, and urinary frequency. She has not noted gross hematuria or decreased urine output. Negative for fever, chills, worsening back pain, abdominal pain, nausea,  vomiting.  Observations/Objective: Patient sounds cheerful and well on the phone. I do not appreciate any SOB. Speech and thought processing are grossly intact. Patient reported vitals:Ht 5' 7.5" (1.715 m)   BMI 25.46 kg/m   Assessment and Plan:  1. Peripheral polyneuropathy Resume Gabapentin 100 mg at bedtime. Appropriate food care. Some side effects dicussed. If not better, we may need to consider further work-up.  2. Generalized anxiety disorder Stable. She has not tolerated SSRIs and SNRIs and has tolerated current treatment well. Continue clonazepam 0.5 mg twice daily.  - clonazePAM (KLONOPIN) 0.5 MG tablet; TAKE 1 TABLET(0.5 MG) BY MOUTH TWICE DAILY AS NEEDED FOR ANXIETY  Dispense: 60 tablet; Refill: 3  3. Urinary tract infection without hematuria, site unspecified She does not want to come to the clinic to bring a urine sample. Multiple antibiotic allergies/side effects. Empiric treatment with cephalexin x5 days started today. If urinary symptoms are now resolved, I will need to see her here in the clinic. Continue adequate hydration. Monitor for warning signs.  - cephALEXin (KEFLEX) 500 MG capsule; Take 1 capsule (500 mg total) by mouth 3 (three) times daily for 5 days.  Dispense: 15 capsule; Refill: 0  Follow Up Instructions:  Return in about 4 months (around 10/31/2020).  I did not refer this patient for an OV in the next 24 hours for this/these issue(s).  I discussed the assessment and treatment plan with the patient. Ms Megan Glover was provided an opportunity to ask questions and all were answered. She agreed with the plan and demonstrated an understanding of the instructions.   I provided 17 minutes of non-face-to-face time during this encounter.   Marinell Igarashi [place], MD

## 2020-07-06 NOTE — Progress Notes (Signed)
Patient scheduled 11/02/2020 at 2 PM

## 2020-08-03 ENCOUNTER — Other Ambulatory Visit: Payer: Self-pay | Admitting: Family Medicine

## 2020-08-03 DIAGNOSIS — I10 Essential (primary) hypertension: Secondary | ICD-10-CM

## 2020-09-17 ENCOUNTER — Telehealth: Payer: Self-pay | Admitting: Family Medicine

## 2020-09-17 NOTE — Progress Notes (Signed)
  Chronic Care Management   Outreach Note  09/17/2020 Name: Megan Glover MRN: [id number] DOB: 09-Aug-1946  Referred by: [place], Betty G, MD Reason for referral : No chief complaint on file.   An unsuccessful telephone outreach was attempted today. The patient was referred to the pharmacist for assistance with care management and care coordination.   Follow Up Plan:   Carley Perdue [employer] Scheduler

## 2020-10-01 ENCOUNTER — Telehealth: Payer: Self-pay | Admitting: Family Medicine

## 2020-10-01 NOTE — Progress Notes (Signed)
  Chronic Care Management   Note  10/01/2020 Name: [name] MRN: [id number] DOB: [birth year]  Megan Glover is a 75 y.o. year old female who is a primary care patient of [place], Timoteo Expose, MD. I reached out to Adriana Simas Mcneary by phone today in response to a referral sent by Ms. Adriana Simas Genis's PCP, [place], Betty G, MD.   [name] was given information about Chronic Care Management services today including:  1. CCM service includes personalized support from designated clinical staff supervised by her physician, including individualized plan of care and coordination with other care providers 2. 24/7 contact phone numbers for assistance for urgent and routine care needs. 3. Service will only be billed when office clinical staff spend 20 minutes or more in a month to coordinate care. 4. Only one practitioner may furnish and bill the service in a calendar month. 5. The patient may stop CCM services at any time (effective at the end of the month) by phone call to the office staff.   Patient wishes to consider information provided and/or speak with a member of the care team before deciding about enrollment in care management services.   Follow up plan:   Carley Perdue [employer] Scheduler

## 2020-10-11 ENCOUNTER — Encounter: Payer: Self-pay | Admitting: Family Medicine

## 2020-10-25 ENCOUNTER — Telehealth: Payer: Self-pay | Admitting: Family Medicine

## 2020-10-25 DIAGNOSIS — F411 Generalized anxiety disorder: Secondary | ICD-10-CM

## 2020-10-26 NOTE — Telephone Encounter (Signed)
Last filled 10/03/20 

## 2020-11-02 ENCOUNTER — Ambulatory Visit: Payer: Medicare Other | Admitting: Family Medicine

## 2020-11-02 NOTE — Telephone Encounter (Signed)
Pt said she has been waiting for the medication to be called in . She lost her husband of covid having a hard time coping need medication    clonazePAM 0.5 MG TAKE 1 TABLET(0.5 MG) BY MOUTH TWICE DAILY AS NEEDED FOR ANXIETY

## 2020-11-02 NOTE — Telephone Encounter (Signed)
Spoke with Dr. [place] - okay for pt to get Rx filled tomorrow; notified the pharmacy.

## 2020-11-02 NOTE — Telephone Encounter (Signed)
Last refill was 10/03/20 - okay for pharmacy to fill now?

## 2020-12-04 ENCOUNTER — Encounter: Payer: Self-pay | Admitting: Family Medicine

## 2020-12-04 ENCOUNTER — Telehealth (INDEPENDENT_AMBULATORY_CARE_PROVIDER_SITE_OTHER): Payer: Medicare Other | Admitting: Family Medicine

## 2020-12-04 VITALS — Ht 67.5 in

## 2020-12-04 DIAGNOSIS — R350 Frequency of micturition: Secondary | ICD-10-CM | POA: Diagnosis not present

## 2020-12-04 DIAGNOSIS — I10 Essential (primary) hypertension: Secondary | ICD-10-CM

## 2020-12-04 DIAGNOSIS — F411 Generalized anxiety disorder: Secondary | ICD-10-CM | POA: Diagnosis not present

## 2020-12-04 DIAGNOSIS — G4700 Insomnia, unspecified: Secondary | ICD-10-CM

## 2020-12-04 DIAGNOSIS — K581 Irritable bowel syndrome with constipation: Secondary | ICD-10-CM

## 2020-12-04 DIAGNOSIS — G629 Polyneuropathy, unspecified: Secondary | ICD-10-CM | POA: Diagnosis not present

## 2020-12-04 DIAGNOSIS — E039 Hypothyroidism, unspecified: Secondary | ICD-10-CM

## 2020-12-04 MED ORDER — GABAPENTIN 100 MG PO CAPS: 200.0000 mg | ORAL_CAPSULE | Freq: Every day | ORAL | 1 refills | Status: DC

## 2020-12-04 NOTE — Progress Notes (Signed)
Virtual Visit via Telephone Note  I connected with Megan Glover on 12/04/20 at  2:30 PM EDT by telephone and verified that I am speaking with the correct person using two identifiers.   I discussed the limitations, risks, security and privacy concerns of performing an evaluation and management service by telephone and the availability of in person appointments. I also discussed with the patient that there may be a patient responsible charge related to this service. The patient expressed understanding and agreed to proceed.  Location patient: home Location provider: work office Participants present for the call: patient, provider Patient did not have a visit in the prior 7 days to address this/these issue(s).   History of Present Illness: Megan Glover is a 75 yo female with hx of PAD, IBS, fibromyalgia,lumbar radiculopathy,hypertension, vitamin D deficiency, COPD, anxiety following on some of her chronic medical problems. Last visit, virtual, 2020/07/15. Her husband died from COVID-19 in 09/11/20, so she has been under a lot of stress. Anxiety on clonazepam 1 mg twice daily. She has not tolerated SSRI's or SNRI's in the past. She has been on Clonazepam for years and still helping with anxiety and sleep.  Peripheral neuropathy: Numbness in lower extremities is getting worse. Problem is worse at night, interfering with sleep. Unstable gait, assisted by a cane. Lyrica and Duloxetine tried years ago, she did not feel like they help and were not well tolerated. Lower back pain is intermittent and stable.  LE muscle cramps, Flexeril 5 mg daily as needed helps. She has not noted lower extremity erythema.  Currently she is on gabapentin 100 mg at bedtime, which she discontinued in 11/2018 and resumed in 06/2020 because recurrent burning sensation in feet and LE pain.  Constipation: Having a bowel movement every 3 to 4 days. Recently she had to take a OTC laxative, bisacodyl 5 mg, which  helped. Abdominal cramps, relieved by defecation. She has not noted blood in the stool. Negative for nausea or vomiting. Colonoscopy in 11/2016: Moderate diverticulosis,polypectomy x 2,and internal hemorrhoids.  Hypertension: Currently she is on Bystolic 10 mg twice daily, hydralazine 10 mg 3 times daily, and losartan 100 mg daily. She has not noted unusual headache, visual changes, CP, worsening dyspnea, palpitations, or edema. Monitoring BP at home exacerbates anxiety.  Lab Results  Component Value Date   CREATININE 0.71 02/12/2020   BUN 12 02/12/2020   NA 126 (L) 02/12/2020   K 4.1 02/12/2020   CL 92 (L) 02/12/2020   CO2 25 02/12/2020   Hypothyroidism: Currently she is on levothyroxine 50 mcg daily. Lab Results  Component Value Date   TSH 1.55 02/12/2020   She thinks she may have a UTI. Last visit,reported urinary symptoms,she was treated empirically for UTI with Cephalexine. UA or Ucx were not performed last visit. Having urinary frequency, she has to strain at the end of urination in order to empty her bladder.  Occasionally she has some dysuria. Problem has been going on since September 11, 2020 and has been stable. She has not noted gross hematuria or decreased urine output. Negative for fever and chills. She has not tried OTC medication.   Observations/Objective: Patient sounds cheerful and well on the phone. I do not appreciate any SOB. Speech and thought processing are grossly intact.+ Anxious. Patient reported vitals:Ht 5' 7.5" (1.715 m)   BMI 25.46 kg/m   Assessment and Plan:  Diagnoses and all orders for this visit: Orders Placed This Encounter  Procedures  . Culture, Urine  . Urinalysis, Routine w  reflex microscopic  . Basic metabolic panel  . TSH    Peripheral polyneuropathy This is a chronic problem, getting worse. We discussed a few options as well as side effects. TCA may aggravate constipation and given her age, I do not recommend. She agrees with  increasing dose of Gabapentin from 100 mg to 200 mg at bedtime. Appropriate foot care and fall precautions to continue.  -     gabapentin (NEURONTIN) 100 MG capsule; Take 2 capsules (200 mg total) by mouth at bedtime.  Urinary frequency Hx does not suggest UTI. We discussed other possible etiologies. Constipation can be aggravating problem. ? Cystocele,overactive bladder. I do not feel comfortable treating again with abx without UA. If acute symptoms or sudden worsening symptoms, she may need to go to the ER. She has transportation issue, so will ask her daughter in law to bring her next week, she is not sure when.  -     Urinalysis, Routine w reflex microscopic; Future -     Culture, Urine; Future  Essential hypertension BP has been adequately controlled with current management, so continue same dose of Bystolic,Hydralaxine,and Losartan. Continue low salt diet.  Generalized anxiety disorder Stable overall. Continue Clonazepam 1 mg bid. [place] controlled substance report reviewed, last Rx filled on 11/03/20, she has 3 refills left.  Irritable bowel syndrome with constipation Continue Bisacodyl 5 mg daily, she could add colace 100 mg OTC and take both at night as needed. Adequate hydration and fiber intake.  Hypothyroidism, unspecified type Problem has been stable. Continue Levothyroxine 50 mcg daily.  Follow Up Instructions:  Return in about 3 months (around 03/05/2021) for labs next week..  I did not refer this patient for an OV in the next 24 hours for this/these issue(s).  I discussed the assessment and treatment plan with the patient. Megan Glover was provided an opportunity to ask questions and all were answered. She agreed with the plan and demonstrated an understanding of the instructions.   The patient was advised to call back or seek an in-person evaluation if the symptoms worsen or if the condition fails to improve as anticipated.  I provided 37 minutes of  non-face-to-face time during this encounter.   Taiten Brawn [place], MD

## 2020-12-16 NOTE — Progress Notes (Signed)
Patient is scheduled for  at 2 PM

## 2020-12-21 NOTE — Telephone Encounter (Signed)
That is fine 

## 2021-01-27 ENCOUNTER — Other Ambulatory Visit: Payer: Self-pay | Admitting: Family Medicine

## 2021-01-27 DIAGNOSIS — F411 Generalized anxiety disorder: Secondary | ICD-10-CM

## 2021-01-29 NOTE — Telephone Encounter (Signed)
Last video visit- 12/04/20 Last refill- 10/26/20- 60 tabs, 3 refills  Next office visit scheduled-03/05/21 Can this patient receive a refill?

## 2021-02-01 NOTE — Telephone Encounter (Signed)
Next refill due on 02/05/21. She is supposed to have one more refill available at her pharmacy, can you please verify it with pharmacy. Thanks, BJ

## 2021-02-01 NOTE — Telephone Encounter (Signed)
I called pharmacy, the refill was deleted, they will need a new Rx.

## 2021-02-02 ENCOUNTER — Other Ambulatory Visit: Payer: Self-pay | Admitting: Family Medicine

## 2021-02-22 ENCOUNTER — Ambulatory Visit: Payer: Medicare Other | Admitting: Family Medicine

## 2021-03-05 ENCOUNTER — Encounter: Payer: Self-pay | Admitting: Family Medicine

## 2021-03-05 ENCOUNTER — Other Ambulatory Visit: Payer: Self-pay

## 2021-03-05 ENCOUNTER — Ambulatory Visit (INDEPENDENT_AMBULATORY_CARE_PROVIDER_SITE_OTHER): Payer: Medicare Other | Admitting: Family Medicine

## 2021-03-05 VITALS — BP 130/80 | HR 77 | Resp 16 | Ht 67.5 in | Wt 134.0 lb

## 2021-03-05 DIAGNOSIS — K581 Irritable bowel syndrome with constipation: Secondary | ICD-10-CM

## 2021-03-05 DIAGNOSIS — R42 Dizziness and giddiness: Secondary | ICD-10-CM

## 2021-03-05 DIAGNOSIS — E559 Vitamin D deficiency, unspecified: Secondary | ICD-10-CM

## 2021-03-05 DIAGNOSIS — I700 Atherosclerosis of aorta: Secondary | ICD-10-CM

## 2021-03-05 DIAGNOSIS — M542 Cervicalgia: Secondary | ICD-10-CM

## 2021-03-05 DIAGNOSIS — R296 Repeated falls: Secondary | ICD-10-CM

## 2021-03-05 DIAGNOSIS — S90221D Contusion of right lesser toe(s) with damage to nail, subsequent encounter: Secondary | ICD-10-CM

## 2021-03-05 DIAGNOSIS — J441 Chronic obstructive pulmonary disease with (acute) exacerbation: Secondary | ICD-10-CM | POA: Diagnosis not present

## 2021-03-05 DIAGNOSIS — E785 Hyperlipidemia, unspecified: Secondary | ICD-10-CM

## 2021-03-05 DIAGNOSIS — I739 Peripheral vascular disease, unspecified: Secondary | ICD-10-CM

## 2021-03-05 DIAGNOSIS — I10 Essential (primary) hypertension: Secondary | ICD-10-CM

## 2021-03-05 DIAGNOSIS — F411 Generalized anxiety disorder: Secondary | ICD-10-CM | POA: Diagnosis not present

## 2021-03-05 DIAGNOSIS — E039 Hypothyroidism, unspecified: Secondary | ICD-10-CM | POA: Diagnosis not present

## 2021-03-05 DIAGNOSIS — G629 Polyneuropathy, unspecified: Secondary | ICD-10-CM

## 2021-03-05 DIAGNOSIS — R350 Frequency of micturition: Secondary | ICD-10-CM

## 2021-03-05 LAB — COMPREHENSIVE METABOLIC PANEL
ALT: 14 U/L (ref 0–35)
AST: 20 U/L (ref 0–37)
Albumin: 4.3 g/dL (ref 3.5–5.2)
Alkaline Phosphatase: 48 U/L (ref 39–117)
BUN: 13 mg/dL (ref 6–23)
CO2: 28 mEq/L (ref 19–32)
Calcium: 9.8 mg/dL (ref 8.4–10.5)
Chloride: 97 mEq/L (ref 96–112)
Creatinine, Ser: 0.75 mg/dL (ref 0.40–1.20)
GFR: 77.66 mL/min (ref >60.00)
Glucose, Bld: 96 mg/dL (ref 70–99)
Potassium: 4.7 mEq/L (ref 3.5–5.1)
Sodium: 133 mEq/L — ABNORMAL LOW (ref 135–145)
Total Bilirubin: 0.6 mg/dL (ref 0.2–1.2)
Total Protein: 6.7 g/dL (ref 6.0–8.3)

## 2021-03-05 LAB — CBC
HCT: 39.1 % (ref 36.0–46.0)
Hemoglobin: 13.1 g/dL (ref 12.0–15.0)
MCHC: 33.6 g/dL (ref 30.0–36.0)
MCV: 94.3 fl (ref 78.0–100.0)
Platelets: 240 10*3/uL (ref 150.0–400.0)
RBC: 4.14 Mil/uL (ref 3.87–5.11)
RDW: 13 % (ref 11.5–15.5)
WBC: 8.2 10*3/uL (ref 4.0–10.5)

## 2021-03-05 LAB — LIPID PANEL
Cholesterol: 158 mg/dL (ref 0–200)
HDL: 59.5 mg/dL (ref >39.00)
LDL Cholesterol: 85 mg/dL (ref 0–99)
NonHDL: 98.85
Total CHOL/HDL Ratio: 3
Triglycerides: 68 mg/dL (ref 0.0–149.0)
VLDL: 13.6 mg/dL (ref 0.0–40.0)

## 2021-03-05 LAB — VITAMIN D 25 HYDROXY (VIT D DEFICIENCY, FRACTURES): VITD: 58.34 ng/mL (ref 30.00–100.00)

## 2021-03-05 LAB — TSH: TSH: 1.22 u[IU]/mL (ref 0.35–5.50)

## 2021-03-05 MED ORDER — TIZANIDINE HCL 4 MG PO TABS: 4.0000 mg | ORAL_TABLET | Freq: Two times a day (BID) | ORAL | 0 refills | Status: DC | PRN

## 2021-03-05 MED ORDER — LIDOCAINE 5 % EX OINT: 1.0000 "application " | TOPICAL_OINTMENT | CUTANEOUS | 2 refills | Status: DC | PRN

## 2021-03-05 MED ORDER — MECLIZINE HCL 25 MG PO TABS: 25.0000 mg | ORAL_TABLET | Freq: Every day | ORAL | 0 refills | Status: DC | PRN

## 2021-03-05 NOTE — Assessment & Plan Note (Signed)
BP adequately controlled. Continue hydralazine 10 mg 3 times daily, losartan 100 mg daily, and Bystolic 20 mg 1/2 tablet twice daily. Continue low-salt diet.

## 2021-03-05 NOTE — Assessment & Plan Note (Signed)
Problem is not well controlled. She has not tolerated Spiriva and LABA/ICS inhalers, the latter one because of oral sores. Continue albuterol 2 puffs every 4-6 hours as needed.

## 2021-03-05 NOTE — Assessment & Plan Note (Signed)
Problem has been stable. Continue levothyroxine 50 mcg daily. Treatment will be adjusted, if needed, according to TSH result.

## 2021-03-05 NOTE — Assessment & Plan Note (Signed)
This problem could be contributing to abdominal pain. We discussed pharmacologic options as well as some side effects of Linzess and Amitiza. She prefers to continue MiraLAX, recommend taking it every other day. Continue adequate fiber intake and hydration.

## 2021-03-05 NOTE — Assessment & Plan Note (Addendum)
Gabapentin 200 mg is not helping, so discontinued. In the past she has not tolerated Cymbalta. Continue appropriate foot/skin care. Topical lidocaine has helped, so continue lidocaine ointment 5% 3 times daily as needed.

## 2021-03-05 NOTE — Patient Instructions (Addendum)
A few things to remember from today's visit:   Essential hypertension - Plan: Comprehensive metabolic panel, CBC, CANCELED: Basic metabolic panel  Generalized anxiety disorder  Hypothyroidism, unspecified type - Plan: TSH  Chronic obstructive pulmonary disease with acute exacerbation (HCC), Chronic  PAD (peripheral artery disease) (HCC), Chronic  Urinary frequency - Plan: Culture, Urine, Urinalysis, Routine w reflex microscopic  Cervicalgia - Plan: tiZANidine (ZANAFLEX) 4 MG tablet  Vitamin D deficiency, unspecified - Plan: VITAMIN D 25 Hydroxy (Vit-D Deficiency, Fractures)  Peripheral polyneuropathy  Hyperlipidemia, unspecified hyperlipidemia type - Plan: Lipid panel  If you need refills please call your pharmacy. Do not use My Chart to request refills or for acute issues that need immediate attention.   Urine problem can be caused by bladder falling down, cystocele. This exercise helps with mild urine leakage associated with cough, laughing, or sneezing. It may help with other types of urine incontinence and even with mild fecal incontinence.   Tighten and relax the pelvic muscles intermittently during the day. Once you are familiar with exercise try to hold pelvic muscles contraction for about 8-10 seconds.in the beginning you may not be able to hold contraction for more than a second or 2 but eventually you will be able to hold contraction harder and for longer time. Perform  8-12 exercises 3 times per day and daily for 15-20 weeks. You will need to continue exercises indefinitely to have a lasting effect.   Flexeril stopped.Zanaflex started. Gabapentin stopped. Topical Lidocaine to continue. You have refills for Clonazepam. No changes in rest. Continue Miralax every 2 days. Abdominal pain could be related to IBS.  Please be sure medication list is accurate. If a new problem present, please set up appointment sooner than planned today.

## 2021-03-05 NOTE — Progress Notes (Signed)
HPI:  Megan Glover is a 75 y.o. female, who is here today for 4 months follow up.   She was last seen on 12/04/2020, virtual visit. Hypertension:  Medications: Bystolic 20 mg 1/2 tablet twice daily, hydralazine 10 mg 3 times daily, and losartan 100 mg daily. BP readings at home: She does not, doing so exacerbates anxiety. Side effects: None.  Negative for visual changes, exertional chest pain, dyspnea, or edema.  Lab Results  Component Value Date   CREATININE 0.71 02/12/2020   BUN 12 02/12/2020   NA 126 (L) 02/12/2020   K 4.1 02/12/2020   CL 92 (L) 02/12/2020   CO2 25 02/12/2020   Hyperlipidemia: Currently on nonpharmacologic treatment. Following a low fat diet: Yes. Side effects from medication:N/A.  She discontinued pravastatin in 07/2019, medication aggravated myalgias. She has also tried Atorvastatin.  PAD: CTA aorta and bifemoral CT 12/10/2013: 1. No evidence of acute vascular abnormality.  2. Moderate to high-grade stenosis of the proximal right common  iliac artery without evidence of occlusion.  3. Moderate stenosis of the proximal left common iliac artery.  4. The bilateral outflow and runoff vessels are relatively disease free.  Lab Results  Component Value Date   CHOL 184 11/23/2015   HDL 49.20 11/23/2015   LDLCALC 120 (H) 11/23/2015   TRIG 74.0 11/23/2015   CHOLHDL 4 11/23/2015   Hypothyroidism: Currently she is on levothyroxine 50 mcg daily. Tolerating medication well, no side effects reported. She has not noted dysphagia, palpitations,worsening tremor, cold/heat intolerance. She has lost some weight due to decrease of oral intake.  Lab Results  Component Value Date   TSH 1.55 02/12/2020   She has a list of complaints today: Toenail changes,decreased urine stream,dizziness,abdominal pain,headache,and constipation.  -6 months ago noted purple right great toe improved, "turning colors." Trauma against furniture, has noted toenail changes  since then, wonders if she needs treatment for fungal infection. Negative for tenderness or periungual erythema/edema. She has not tried OTC medication.  Peripheral neuropathy: Numbness on LE's. She has not noted skin ulcers or rash. Last visit gabapentin was increased from 100 mg to 200 mg, she has not noted significant improvement. Also history of lower back pain with radiculopathy and fibromyalgia.  Constipation: Seems to be getting worse. Having a bowel movement q 3-5 days. Lower abdominal pain, "bad", intermittent, some relief with defecation and by passing gas. Miralax,bisacodyl,and colace helped with bowel movements but did not help with pain, so she stopped. She takes ParamedicBenafiber. In the past she has tried Linzess but it caused diarrhea. Hx of hernia repair, wonders if this is causing abdominal pain. Abdominal/pelvic CT 03/30/17:  1. No explanation for the patient's right lower quadrant pain is seen other than the presence of fat extending into the right inguinal hernia. No bowel enters this hernia. 2. The appendix and terminal ileum are unremarkable. 3. Multiple rectosigmoid colonic diverticula.  No diverticulitis. 4. Significant abdominal aortic atherosclerotic change.  She is concerned about possible UTI. For some time she has had decreased urinary stream, she has to strain in order to start and complete urination. Nocturia x3-4,small amount of urine at the time, and urgency. Negative for dysuria, gross hematuria, decreased urine output, or urine incontinence. No vaginal bleeding or discharge.  Dizziness: She has had problem intermittently for a while. She is not sure if it is spinning now or about exacerbating or alleviating factors.  She is requesting a Rx for Meclizine, which is helping daily in the past. She feels  unbalance. 9 falls since her last office visit.  Son happened while she was camping, walking on uneven ground. Last fall when she was getting down of her son's  truck, 2 months ago. No head trauma or serious injury.  She also fell in her garage, light was off, walking down 3 steps. No injury, landed on knees.  Headaches 1-2 per week, wakes up in the morning with pain. Low occipital and neck pain. "Hurting pain", 7/10. No associated photophobia, focal neurologic deficit, nausea, or vomiting. She takes Tylenol 500 mg daily prn. She is requesting a Rx for Fioricet.   Headaches started about 3 months ago. Reporting this i as a new problem. She has been under a lot of stress.  Husband mentioned years ago that she stopped breathing while asleep. + Fatigue.  Anxiety: Currently she is on clonazepam 0.5 mg twice daily.  Requesting refills. In the past she has not tolerated SSRIs or SNRIs, including Celexa and Effexor, the latter one caused diarrhea.  COPD: For the past few months she is using albuterol inhaler 3-4 times per daily for cough and wheezing. She has not tolerated other inhalers in the past, including Spiriva and Breo Ellipta (04/2015).  ICS/LABA inhaler because tended mouth sores. Negative for fever, chills, dyspnea. No sick contact.  Review of Systems  Constitutional:  Positive for appetite change and fatigue. Negative for activity change.  HENT:  Negative for mouth sores, nosebleeds and sore throat.   Gastrointestinal:  Positive for abdominal pain and constipation. Negative for blood in stool.  Endocrine: Negative for polydipsia, polyphagia and polyuria.  Genitourinary:  Negative for decreased urine volume and hematuria.  Musculoskeletal:  Positive for arthralgias, back pain and gait problem.  Skin:  Negative for pallor and rash.  Allergic/Immunologic: Positive for environmental allergies.  Neurological:  Negative for syncope, facial asymmetry and weakness.  Psychiatric/Behavioral:  Negative for confusion. The patient is nervous/anxious.   Rest of ROS, see pertinent positives sand negatives in HPI  Current Outpatient Medications  on File Prior to Visit  Medication Sig Dispense Refill   acetaminophen (TYLENOL) 500 MG tablet Take 1,000 mg by mouth every 8 (eight) hours as needed for moderate pain.     albuterol (PROVENTIL) (2.5 MG/3ML) 0.083% nebulizer solution Take 3 mLs (2.5 mg total) by nebulization every 6 (six) hours as needed for wheezing or shortness of breath. 150 mL 2   albuterol (VENTOLIN HFA) 108 (90 Base) MCG/ACT inhaler INHALE 2 PUFFS INTO THE LUNGS EVERY 4 HOURS AS NEEDED 8.5 g 1   aspirin 81 MG chewable tablet Chew 81 mg by mouth.     Biotin 1000 MCG tablet Take by mouth.     BYSTOLIC 20 MG TABS TAKE ONE-HALF TABLET BY  MOUTH TWICE DAILY 90 tablet 3   Cholecalciferol (VITAMIN D3) 2000 units TABS Take 1 tablet by mouth daily.     clonazePAM (KLONOPIN) 0.5 MG tablet TAKE 1 TABLET(0.5 MG) BY MOUTH TWICE DAILY AS NEEDED FOR ANXIETY 60 tablet 3   hydrALAZINE (APRESOLINE) 10 MG tablet TAKE 1 TABLET BY MOUTH THREE TIMES DAILY 270 tablet 1   levothyroxine (SYNTHROID) 50 MCG tablet TAKE 1 TABLET(50 MCG) BY MOUTH DAILY BEFORE BREAKFAST 90 tablet 3   losartan (COZAAR) 100 MG tablet TAKE 1 TABLET(100 MG) BY MOUTH EVERY MORNING 90 tablet 3   OVER THE COUNTER MEDICATION Apply 2 drops to eye at bedtime as needed (for itchy dry eyes.).     PROCTOZONE-HC 2.5 % rectal cream USE RECTALLY EVERY DAY  AS NEEDED 30 g 1   No current facility-administered medications on file prior to visit.   Past Medical History:  Diagnosis Date   Anemia    Anxiety    Has tried Celexa and effexor, poor tolerance.   Cataract    bilateral -left > right   Clostridium difficile diarrhea    05-09-14 denies any problems at this time   COPD (chronic obstructive pulmonary disease) (HCC)    Depression    Fibromyalgia    History of diverticulosis    Hyperlipidemia    Statins aggravated myalgias:Pravastatin and Atorvastatin among some.   Hypertension    Hypothyroidism    IBS (irritable bowel syndrome)    Neuromuscular disorder (HCC)     fibromyalgia   Thyroid disease    Allergies  Allergen Reactions   Sulfa Antibiotics Other (See Comments) and Itching    Anal itching "anal itching"   Demerol  [Meperidine Hcl] Nausea And Vomiting   Levofloxacin In D5w Other (See Comments)    Yeast infections.  "Yeast infections"   Spironolactone Other (See Comments)    Weakness/malaise "Weakness" Other reaction(s): Other (See Comments) Weakness/malaise   Clindamycin     Other reaction(s): Other (See Comments)   Clindamycin/Lincomycin     CDIF   Erythromycin Diarrhea   Erythromycin Base     OTHER REACTION(S): Diarrhea Other reaction(s): Other (See Comments) OTHER REACTION(S): Diarrhea   Fluconazole Other (See Comments)   Hydrochlorothiazide Other (See Comments)    Weakness/malaise Other reaction(s): Other (See Comments) Weakness/malaise   Lincomycin Diarrhea    CDIF   Meperidine Nausea And Vomiting, Other (See Comments) and Hypertension    Severe headache OTHER REACTION(S): Hypertension Severe headache   Ciprofloxacin Diarrhea   Levofloxacin Nausea Only    Yeast infection OTHER REACTION(S): Diarrhea Other reaction(s): Other (See Comments) Yeast infection   Metronidazole Nausea Only    OTHER REACTION(S): Diarrhea OTHER REACTION(S): Diarrhea    Other Itching    Social History   Socioeconomic History   Marital status: Married    Spouse name: Dahna Hattabaugh   Number of children: Not on file   Years of education: Not on file   Highest education level: Not on file  Occupational History   Not on file  Tobacco Use   Smoking status: Former    Packs/day: 1.50    Years: 25.00    Pack years: 37.50    Types: Cigarettes    Quit date: 05/09/1980    Years since quitting: 40.8   Smokeless tobacco: Never  Vaping Use   Vaping Use: Never used  Substance and Sexual Activity   Alcohol use: No    Alcohol/week: 0.0 standard drinks   Drug use: No   Sexual activity: Not Currently  Other Topics Concern   Not on file   Social History Narrative   Not on file   Social Determinants of Health   Financial Resource Strain: Low Risk    Difficulty of Paying Living Expenses: Not hard at all  Food Insecurity: No Food Insecurity   Worried About Programme researcher, broadcasting/film/video in the Last Year: Never true   Ran Out of Food in the Last Year: Never true  Transportation Needs: No Transportation Needs   Lack of Transportation (Medical): No   Lack of Transportation (Non-Medical): No  Physical Activity: Insufficiently Active   Days of Exercise per Week: 4 days   Minutes of Exercise per Session: 30 min  Stress: No Stress Concern Present   Feeling of Stress :  Not at all  Social Connections: Moderately Integrated   Frequency of Communication with Friends and Family: Three times a week   Frequency of Social Gatherings with Friends and Family: Once a week   Attends Religious Services: More than 4 times per year   Active Member of [employer] or Organizations: No   Attends Banker Meetings: Never   Marital Status: Married   Vitals:   03/05/21 1340  BP: 130/80  Pulse: 77  Resp: 16  SpO2: 97%   Body mass index is 20.68 kg/m.  Physical Exam Vitals and nursing note reviewed.  Constitutional:      General: She is not in acute distress.    Appearance: She is well-developed and normal weight.  HENT:     Head: Normocephalic and atraumatic.     Mouth/Throat:     Mouth: Mucous membranes are moist.     Pharynx: Oropharynx is clear.  Eyes:     Conjunctiva/sclera: Conjunctivae normal.  Cardiovascular:     Rate and Rhythm: Normal rate and regular rhythm.     Pulses:          Dorsalis pedis pulses are 2+ on the right side and 2+ on the left side.     Heart sounds: No murmur heard. Pulmonary:     Effort: Pulmonary effort is normal. No respiratory distress.     Breath sounds: Normal breath sounds.  Abdominal:     Palpations: Abdomen is soft. There is no hepatomegaly or mass.     Tenderness: There is no abdominal  tenderness.  Musculoskeletal:     Cervical back: Tenderness present. No bony tenderness. Decreased range of motion.  Lymphadenopathy:     Cervical: No cervical adenopathy.  Skin:    General: Skin is warm.     Findings: No erythema or rash.  Neurological:     General: No focal deficit present.     Mental Status: She is alert and oriented to person, place, and time.     Cranial Nerves: No cranial nerve deficit.     Comments: Unstable gait, assisted by a cane. Decreased monofilament feet.  Psychiatric:        Mood and Affect: Mood is anxious.     Comments: Well groomed, good eye contact.   ASSESSMENT AND PLAN:  Ms. Autum Benfer was seen today for 4 months follow-up.  Orders Placed This Encounter  Procedures   Comprehensive metabolic panel   Lipid panel   VITAMIN D 25 Hydroxy (Vit-D Deficiency, Fractures)   CBC   Urinalysis with Reflex Microscopic   VAS [place] ABI WITH/WO TBI   Lab Results  Component Value Date   CHOL 158 03/05/2021   HDL 59.50 03/05/2021   LDLCALC 85 03/05/2021   TRIG 68.0 03/05/2021   CHOLHDL 3 03/05/2021   Lab Results  Component Value Date   CREATININE 0.75 03/05/2021   BUN 13 03/05/2021   NA 133 (L) 03/05/2021   K 4.7 03/05/2021   CL 97 03/05/2021   CO2 28 03/05/2021   Lab Results  Component Value Date   ALT 14 03/05/2021   AST 20 03/05/2021   ALKPHOS 48 03/05/2021   BILITOT 0.6 03/05/2021   Lab Results  Component Value Date   WBC 8.2 03/05/2021   HGB 13.1 03/05/2021   HCT 39.1 03/05/2021   MCV 94.3 03/05/2021   PLT 240.0 03/05/2021   Urinary frequency This has been a recurrent problem. ? Overactive bladder,cystocele. We discussed a few pharmacologic treatment options,  most with anticholinergic side effects. Kegel and pelvic floor exercises recommended for now. Further recommendations according to UA and Ucx.  Cervicalgia + Occipital headache. Hx suggest tension like headache and cervicalgia. I do not recommend medications  like Fioricet. She agrees with stopping Flexeril and trying Zanaflex. Side effects discussed. She is not interested in PT.  -     tiZANidine (ZANAFLEX) 4 MG tablet; Take 1 tablet (4 mg total) by mouth every 12 (twelve) hours as needed for muscle spasms.  Hyperlipidemia, unspecified hyperlipidemia type Continue non pharmacologic treatment. Further recommendations according to FLP results.  Dizziness We discussed possible etiologies. Could be vertigo. Meclizine has helped in the past, so Rx sent. Side effects discussed. Fall precautions discussed.  -     meclizine (ANTIVERT) 25 MG tablet; Take 1 tablet (25 mg total) by mouth daily as needed for dizziness.  Falls frequently Some of her chronic medical conditions and medications can increase risk for falls. Fall precautions discussed. She will benefit for PT, she is not interested in doing so for now.  Subungual hematoma of toenail of right foot, subsequent encounter Toenail eventually is going to fall. I do not think imaging is needed at this time.  Atherosclerosis of aorta (HCC) She has not tolerated statins in the past, last one Pravastatin. Continue Aspirin 81 mg.  Irritable bowel syndrome with constipation This problem could be contributing to abdominal pain. We discussed pharmacologic options as well as some side effects of Linzess and Amitiza. She prefers to continue MiraLAX, recommend taking it every other day. Continue adequate fiber intake and hydration.   Essential hypertension BP adequately controlled. Continue hydralazine 10 mg 3 times daily, losartan 100 mg daily, and Bystolic 20 mg 1/2 tablet twice daily. Continue low-salt diet.  Peripheral neuropathy Gabapentin 200 mg is not helping, so discontinued. In the past she has not tolerated Cymbalta. Continue appropriate foot/skin care. Topical lidocaine has helped, so continue lidocaine ointment 5% 3 times daily as needed.  Generalized anxiety  disorder Exacerbated by the death of her husband and dealing with financial issues. She has not tolerated SSRIs or SNRIs in the past. Continue clonazepam 0.5 mg twice daily, she has 3 refills available at her pharmacy.  COPD (chronic obstructive pulmonary disease) (HCC) Problem is not well controlled. She has not tolerated Spiriva and LABA/ICS inhalers, the latter one because of oral sores. Continue albuterol 2 puffs every 4-6 hours as needed.  Hypothyroidism Problem has been stable. Continue levothyroxine 50 mcg daily. Treatment will be adjusted, if needed, according to TSH result.  Vitamin D deficiency, unspecified Continue current dose of vitamin D supplementation.  Further recommendation will be given according to 25 [place] vitamin D result.  PAD (peripheral artery disease) (HCC) Peripheral pulses present. Because complaining of lower extremity pain, which could be caused by fibromyalgia and peripheral neuropathy, ABI will be arranged. Continue appropriate skin care and trauma avoidance. She has not tolerated statins in the past. Continue Aspirin 81 mg daily.  -     lidocaine (XYLOCAINE) 5 % ointment; Apply 1 application topically as needed.  I spent a total of 75 minutes in both face to face and non face to face activities for this visit on the date of this encounter. During this time history was obtained and documented, examination was performed, prior labs/imaging/past records reviewed, and assessment/plan discussed.  Return in about 4 months (around 07/06/2021).  Kosha Jaquith G. [place], MD  [facility]. [place] office.

## 2021-03-05 NOTE — Assessment & Plan Note (Signed)
Continue current dose of vitamin D supplementation. Further recommendation will be given according to 25 [place] vitamin D result. 

## 2021-03-05 NOTE — Assessment & Plan Note (Signed)
Peripheral pulses present. Because complaining of lower extremity pain, which could be caused by fibromyalgia and peripheral neuropathy, ABI will be arranged. Continue appropriate skin care and trauma avoidance. She has not tolerated statins in the past. Continue Aspirin 81 mg daily.

## 2021-03-05 NOTE — Assessment & Plan Note (Signed)
Exacerbated by the death of her husband and dealing with financial issues. She has not tolerated SSRIs or SNRIs in the past. Continue clonazepam 0.5 mg twice daily, she has 3 refills available at her pharmacy.

## 2021-03-06 ENCOUNTER — Encounter: Payer: Self-pay | Admitting: Family Medicine

## 2021-03-06 LAB — URINALYSIS, ROUTINE W REFLEX MICROSCOPIC
Bilirubin Urine: NEGATIVE
Glucose, UA: NEGATIVE
Hgb urine dipstick: NEGATIVE
Ketones, ur: NEGATIVE
Leukocytes,Ua: NEGATIVE
Nitrite: NEGATIVE
Protein, ur: NEGATIVE
Specific Gravity, Urine: 1.017 (ref 1.001–1.035)
pH: 5.5 (ref 5.0–8.0)

## 2021-03-06 LAB — URINE CULTURE
MICRO NUMBER:: 12152459
SPECIMEN QUALITY:: ADEQUATE

## 2021-03-08 ENCOUNTER — Other Ambulatory Visit: Payer: Self-pay

## 2021-03-08 MED ORDER — ROSUVASTATIN CALCIUM 10 MG PO TABS: ORAL_TABLET | ORAL | 3 refills | Status: DC

## 2021-04-12 ENCOUNTER — Other Ambulatory Visit: Payer: Self-pay | Admitting: Family Medicine

## 2021-04-15 ENCOUNTER — Telehealth: Payer: Self-pay | Admitting: Family Medicine

## 2021-04-15 ENCOUNTER — Telehealth: Payer: Self-pay

## 2021-04-15 DIAGNOSIS — F411 Generalized anxiety disorder: Secondary | ICD-10-CM

## 2021-04-15 NOTE — Telephone Encounter (Signed)
PT called to advise that she called over to the [facility] and they informed her that she needs a referral to their location. They are associated with [facility] and the fax # for them is [phone] and the lady name is [place].

## 2021-04-15 NOTE — Telephone Encounter (Signed)
Patient called stating she was shorted 16 pills of the clonazePAM (KLONOPIN) 0.5 MG tablet from the pharmacy pt called [facility] to report pt stated she will be without for 8 days and would like Rx refill sent in

## 2021-04-15 NOTE — Telephone Encounter (Signed)
PT called to advise that she would like to be called at [phone]. PT states that she no longer has a computer and use to be contacted by email by [place] and would like for [place] to now call her in regards to this refill when its been sent in.

## 2021-04-16 ENCOUNTER — Telehealth: Payer: Self-pay

## 2021-04-16 MED ORDER — CLONAZEPAM 0.5 MG PO TABS: 0.5000 mg | ORAL_TABLET | Freq: Two times a day (BID) | ORAL | 0 refills | Status: DC

## 2021-04-16 NOTE — Telephone Encounter (Signed)
I spoke with the pharmacist and they confirmed that their counts are correct. The pharmacist did tell me that the patient has had some people in and out of her house, so the medication could have been tampered with. I have sent a message to pcp to see if it is okay to authorize a prescription for 16 tablets due to patient has always been compliant.

## 2021-04-16 NOTE — Telephone Encounter (Signed)
I spoke with the pharmacist, pt's Rx was filled for 270 tablets for $30 which is a 90 day supply. Unable to get in touch with patient.

## 2021-04-16 NOTE — Telephone Encounter (Signed)
Ultrasound order is already placed, message sent to referral coordinator to see what is going on with the order.

## 2021-04-16 NOTE — Telephone Encounter (Signed)
I spoke with patient. We went over all the information and answered any questions that she had.

## 2021-04-16 NOTE — Telephone Encounter (Signed)
I tried calling patient, unable to leave a voicemail. I spoke with the pharmacist, they are going to fill the Rx for the 16 tablets and wave the cost.

## 2021-04-16 NOTE — Telephone Encounter (Signed)
Patient called stating she is being charged $30 for 90 pills of hydrALAZINE (APRESOLINE) 10 MG tablet when it use to be 270 pills for the same price pt also mentioned referral information.

## 2021-04-16 NOTE — Addendum Note (Signed)
Addended by: Weyman Croon E on: 04/16/2021 11:00 AM   Modules accepted: Orders

## 2021-04-16 NOTE — Telephone Encounter (Signed)
Referral for ultrasound is being taken care of.

## 2021-04-16 NOTE — Telephone Encounter (Signed)
It is ok to call in for 16 missing tabs but please let her know that because this is a controlled medication that can be misused/abused, she is supposed to keep it in a safe place. If this happens again medication will not be replaced. Thanks, BJ

## 2021-04-16 NOTE — Telephone Encounter (Signed)
Patient was shorted 16 pills by the pharmacy, she has reported it. She does have another refill, is it okay to authorize a refill on the missing 16 tablets?

## 2021-04-23 ENCOUNTER — Telehealth: Payer: Self-pay | Admitting: Family Medicine

## 2021-04-23 DIAGNOSIS — I739 Peripheral vascular disease, unspecified: Secondary | ICD-10-CM

## 2021-04-23 NOTE — Telephone Encounter (Signed)
Patient called to ask if she was getting both legs checked out because she needs both legs to be checked.  Please advise.

## 2021-04-23 NOTE — Telephone Encounter (Signed)
Patient called today to say that she made an appointment with the [facility]ascular and Ve[facility] location and was told that they need a referral placed by Dr. [place]. She would like a referral to be placed before her appointment that she has scheduled.  Patient also says that she would like someone to give her a call explaining what an ABI test is and what exactly that entails.  Patient's contact number is [phone].  Please advise.

## 2021-04-23 NOTE — Telephone Encounter (Signed)
I called and spoke with patient, she is aware the ultrasound should be for both legs.

## 2021-04-23 NOTE — Telephone Encounter (Signed)
Disregard

## 2021-04-23 NOTE — Telephone Encounter (Signed)
I called and spoke with patient. She is aware that the ABI is to check and make sure the blood flow is working as it should be.

## 2021-05-13 ENCOUNTER — Emergency Department (HOSPITAL_COMMUNITY): Payer: Medicare Other

## 2021-05-13 ENCOUNTER — Emergency Department (HOSPITAL_COMMUNITY)
Admission: EM | Admit: 2021-05-13 | Discharge: 2021-05-13 | Disposition: A | Discharge status: Home / Self Care | Payer: Medicare Other | Attending: Emergency Medicine | Admitting: Emergency Medicine

## 2021-05-13 ENCOUNTER — Encounter (HOSPITAL_COMMUNITY): Payer: Self-pay | Admitting: Emergency Medicine

## 2021-05-13 DIAGNOSIS — Z87891 Personal history of nicotine dependence: Secondary | ICD-10-CM | POA: Diagnosis not present

## 2021-05-13 DIAGNOSIS — W1839XA Other fall on same level, initial encounter: Secondary | ICD-10-CM | POA: Diagnosis not present

## 2021-05-13 DIAGNOSIS — S43015A Anterior dislocation of left humerus, initial encounter: Secondary | ICD-10-CM | POA: Diagnosis not present

## 2021-05-13 DIAGNOSIS — E039 Hypothyroidism, unspecified: Secondary | ICD-10-CM | POA: Insufficient documentation

## 2021-05-13 DIAGNOSIS — Z79899 Other long term (current) drug therapy: Secondary | ICD-10-CM | POA: Insufficient documentation

## 2021-05-13 DIAGNOSIS — S4992XA Unspecified injury of left shoulder and upper arm, initial encounter: Secondary | ICD-10-CM | POA: Diagnosis present

## 2021-05-13 DIAGNOSIS — Z7982 Long term (current) use of aspirin: Secondary | ICD-10-CM | POA: Diagnosis not present

## 2021-05-13 DIAGNOSIS — J449 Chronic obstructive pulmonary disease, unspecified: Secondary | ICD-10-CM | POA: Insufficient documentation

## 2021-05-13 DIAGNOSIS — I10 Essential (primary) hypertension: Secondary | ICD-10-CM | POA: Insufficient documentation

## 2021-05-13 DIAGNOSIS — S4292XA Fracture of left shoulder girdle, part unspecified, initial encounter for closed fracture: Secondary | ICD-10-CM

## 2021-05-13 DIAGNOSIS — S42252A Displaced fracture of greater tuberosity of left humerus, initial encounter for closed fracture: Secondary | ICD-10-CM | POA: Insufficient documentation

## 2021-05-13 DIAGNOSIS — Y92009 Unspecified place in unspecified non-institutional (private) residence as the place of occurrence of the external cause: Secondary | ICD-10-CM | POA: Diagnosis not present

## 2021-05-13 MED ORDER — ETOMIDATE 2 MG/ML IV SOLN
INTRAVENOUS | Status: AC
  Filled 2021-05-13: qty 10

## 2021-05-13 MED ORDER — HYDRALAZINE HCL 10 MG PO TABS
10.0000 mg | ORAL_TABLET | Freq: Three times a day (TID) | ORAL | Status: DC
  Administered 2021-05-13: 10 mg via ORAL
  Filled 2021-05-13: qty 1

## 2021-05-13 MED ORDER — ETOMIDATE 2 MG/ML IV SOLN: 0.1000 mg/kg | Freq: Once | INTRAVENOUS | Status: DC

## 2021-05-13 MED ORDER — OXYCODONE HCL 5 MG PO TABS: 5.0000 mg | ORAL_TABLET | Freq: Four times a day (QID) | ORAL | 0 refills | Status: AC | PRN

## 2021-05-13 MED ORDER — PROPOFOL 10 MG/ML IV BOLUS
INTRAVENOUS | Status: AC | PRN
Start: 2021-05-13 — End: 2021-05-13
  Administered 2021-05-13: 20 mg via INTRAVENOUS
  Administered 2021-05-13: 10 mg via INTRAVENOUS

## 2021-05-13 MED ORDER — OXYCODONE HCL 5 MG PO TABS
5.0000 mg | ORAL_TABLET | Freq: Once | ORAL | Status: AC
Start: 2021-05-13 — End: 2021-05-13
  Administered 2021-05-13: 5 mg via ORAL
  Filled 2021-05-13: qty 1

## 2021-05-13 MED ORDER — ETOMIDATE 2 MG/ML IV SOLN
INTRAVENOUS | Status: AC | PRN
  Administered 2021-05-13: 3 mg via INTRAVENOUS
  Administered 2021-05-13: 6 mg via INTRAVENOUS
  Administered 2021-05-13: 3 mg via INTRAVENOUS

## 2021-05-13 MED ORDER — LOSARTAN POTASSIUM 50 MG PO TABS
100.0000 mg | ORAL_TABLET | Freq: Once | ORAL | Status: AC
  Administered 2021-05-13: 100 mg via ORAL
  Filled 2021-05-13: qty 2

## 2021-05-13 MED ORDER — LEVOTHYROXINE SODIUM 25 MCG PO TABS
50.0000 ug | ORAL_TABLET | Freq: Once | ORAL | Status: AC
  Administered 2021-05-13: 50 ug via ORAL
  Filled 2021-05-13: qty 2

## 2021-05-13 NOTE — ED Provider Notes (Signed)
Emergency Medicine Provider Triage Evaluation Note  Megan Glover , a 75 y.o. female  was evaluated in triage.  Pt complains of left shoulder pain.  States that she was walking in the dark today, thought that the door was in front of her and fell.  No loss of consciousness, did not hit head.  Reports pain of the left shoulder that radiates down the left arm..  Review of Systems  Positive: Left shoulder pain Negative: Neck or back injury  Physical Exam  There were no vitals taken for this visit. Gen:   Awake, no distress   Resp:  Normal effort  MSK:   Moves extremities without difficulty  Other:  No midline C-spine, T-spine, L-spine tenderness.  Question left shoulder deformity.  Sensation intact, radial pulse present.  No tenderness in the elbow or wrist.  Medical Decision Making  Medically screening exam initiated at 8:27 AM.  Appropriate orders placed.  Avira Tillison Crady was informed that the remainder of the evaluation will be completed by another provider, this initial triage assessment does not replace that evaluation, and the importance of remaining in the ED until their evaluation is complete.     Jeannie Fend, PA-C 05/13/21 5400    Milagros Loll, MD 05/13/21 [phone]

## 2021-05-13 NOTE — Procedures (Signed)
Procedure: Left shoulder closed reduction   Indication: Left shoulder dislocation   Surgeon: Charma Igo, PA-C   Assist: Gloris Manchester, MD   Anesthesia: Propofol via Dr. Durwin Nora   EBL: None   Complications: None   Findings: After risks/benefits explained patient desires to undergo procedure. Consent obtained and time out performed. Sedation given and confirmed. Shoulder reduced. X-rays confirmed relocation. Pt tolerated the procedure well.       Freeman Caldron, PA-C Orthopedic Surgery [phone]

## 2021-05-13 NOTE — Consult Note (Signed)
Reason for Consult:Left shoulder dislocation Referring Physician: Gloris Manchester Time called: 1418 Time at bedside:1420    Megan Glover is an 75 y.o. female.  HPI: Megan Glover was walking in the dark and tripped over something. She fell and had immediate left shoulder pain. She was brought to the ED where x-rays showed an anterior shoulder dislocation and greater troch fx. EDP attempted reduction unsuccessfully and orthopedic surgery was consulted.  Past Medical History:  Diagnosis Date   Anemia    Anxiety    Has tried Celexa and effexor, poor tolerance.   Cataract    bilateral -left > right   Clostridium difficile diarrhea    05-09-14 denies any problems at this time   COPD (chronic obstructive pulmonary disease) (HCC)    Depression    Fibromyalgia    History of diverticulosis    Hyperlipidemia    Statins aggravated myalgias:Pravastatin and Atorvastatin among some.   Hypertension    Hypothyroidism    IBS (irritable bowel syndrome)    Neuromuscular disorder (HCC)    fibromyalgia   Thyroid disease     Past Surgical History:  Procedure Laterality Date   Cataract removal 2015 Bilateral    DILATION AND CURETTAGE OF UTERUS     FRACTURE SURGERY     Right femoral fx 02/22/14   INGUINAL HERNIA REPAIR Bilateral 05/16/2014   Procedure: LAPAROSCOPIC EXPLORATION AND REPAIR OF BILATERAL FEMEROL AND BILATERAL INGUINAL HERNIAS  WITH MESH;  Surgeon: Karie Soda, MD;  Location: [facility];  Service: General;  Laterality: Bilateral;   INTRAMEDULLARY (IM) NAIL INTERTROCHANTERIC Right 02/21/2014   Procedure: INTRAMEDULLARY (IM) NAIL INTERTROCHANTRIC;  Surgeon: Eulas Post, MD;  Location: [facility];  Service: Orthopedics;  Laterality: Right;    Family History  Problem Relation Age of Onset   Breast cancer Mother        breast CA   Diabetes Father    Hypertension Father    Colon cancer Other    Breast cancer Sister    Breast cancer Sister    Cancer Sister        breast   Cancer Sister         breast    Social History:  reports that she quit smoking about 41 years ago. Her smoking use included cigarettes. She has a 37.50 pack-year smoking history. She has never used smokeless tobacco. She reports that she does not drink alcohol and does not use drugs.  Allergies:  Allergies  Allergen Reactions   Sulfa Antibiotics Other (See Comments) and Itching    Anal itching "anal itching"   Demerol  [Meperidine Hcl] Nausea And Vomiting   Levofloxacin In D5w Other (See Comments)    Yeast infections.  "Yeast infections"   Spironolactone Other (See Comments)    Weakness/malaise "Weakness" Other reaction(s): Other (See Comments) Weakness/malaise   Clindamycin     Other reaction(s): Other (See Comments)   Clindamycin/Lincomycin     CDIF   Erythromycin Diarrhea   Erythromycin Base     OTHER REACTION(S): Diarrhea Other reaction(s): Other (See Comments) OTHER REACTION(S): Diarrhea   Fluconazole Other (See Comments)   Hydrochlorothiazide Other (See Comments)    Weakness/malaise Other reaction(s): Other (See Comments) Weakness/malaise   Lincomycin Diarrhea    CDIF   Meperidine Nausea And Vomiting, Other (See Comments) and Hypertension    Severe headache OTHER REACTION(S): Hypertension Severe headache   Ciprofloxacin Diarrhea   Levofloxacin Nausea Only    Yeast infection OTHER REACTION(S): Diarrhea Other reaction(s): Other (See Comments) Yeast  infection   Metronidazole Nausea Only    OTHER REACTION(S): Diarrhea OTHER REACTION(S): Diarrhea    Other Itching    Medications: I have reviewed the patient's current medications.  No results found for this or any previous visit (from the past 48 hour(s)).  DG Shoulder Left  Result Date: 05/13/2021 CLINICAL DATA:  75 year old female status post fall this morning landing on left shoulder. EXAM: LEFT SHOULDER - 2+ VIEW COMPARISON:  Chest radiographs 04/09/2018 and earlier. FINDINGS: Anterior, subcoracoid glenohumeral  dislocation with associated comminuted fracture of the greater tuberosity. Mild displacement of comminution fragments. The glenoid and left scapula appear to remain intact. Left clavicle appears intact. Visible left ribs appear intact, pulmonary hyperinflation suspected, clear visible left lung. IMPRESSION: Anterior left glenohumeral fracture dislocation. Comminution and mild displacement of the greater tuberosity. No scapula fracture identified. Electronically Signed   By: Odessa Fleming M.D.   On: 05/13/2021 09:11   DG Shoulder Left Portable  Result Date: 05/13/2021 CLINICAL DATA:  Postreduction EXAM: LEFT SHOULDER COMPARISON:  05/13/2021 FINDINGS: Satisfactory shoulder reduction. Comminuted fracture of the greater tuberosity with multiple bony fragments present. There is widening of the glenohumeral joint which may be due to effusion. Degenerative change in the [facility] joint. IMPRESSION: Satisfactory shoulder reduction. Comminuted fracture greater tuberosity of the humerus. Electronically Signed   By: Marlan Palau M.D.   On: 05/13/2021 14:43    Review of Systems  HENT:  Negative for ear discharge, ear pain, hearing loss and tinnitus.   Eyes:  Negative for photophobia and pain.  Respiratory:  Negative for cough and shortness of breath.   Cardiovascular:  Negative for chest pain.  Gastrointestinal:  Negative for abdominal pain, nausea and vomiting.  Genitourinary:  Negative for dysuria, flank pain, frequency and urgency.  Musculoskeletal:  Positive for arthralgias (Left shoulder). Negative for back pain, myalgias and neck pain.  Neurological:  Negative for dizziness and headaches.  Hematological:  Does not bruise/bleed easily.  Psychiatric/Behavioral:  The patient is not nervous/anxious.   Blood pressure (!) 161/73, pulse 73, temperature (!) 97.5 F (36.4 C), temperature source Oral, resp. rate (!) 23, weight 60.8 kg, SpO2 100 %. Physical Exam Constitutional:      General: She is not in acute distress.     Appearance: She is well-developed. She is not diaphoretic.  HENT:     Head: Normocephalic and atraumatic.  Eyes:     General: No scleral icterus.       Right eye: No discharge.        Left eye: No discharge.     Conjunctiva/sclera: Conjunctivae normal.  Cardiovascular:     Rate and Rhythm: Normal rate and regular rhythm.  Pulmonary:     Effort: Pulmonary effort is normal. No respiratory distress.  Musculoskeletal:     Cervical back: Normal range of motion.     Comments: Left shoulder, elbow, wrist, digits- no skin wounds, mod TTP, prominent humeral head anteriorly, no instability, no blocks to motion  Sens  Ax/R/M/U intact  Mot   Ax/ R/ PIN/ M/ AIN/ U intact  Rad 2+  Skin:    General: Skin is warm and dry.  Neurological:     Mental Status: She is alert.  Psychiatric:        Mood and Affect: Mood normal.        Behavior: Behavior normal.    Assessment/Plan: Left shoulder dislocation -- Plan CR via CS per EDP. Sling, NWB, and f/u with Dr. Aundria Rud next week. Anticipate non-operative management.  Freeman Caldron, PA-C Orthopedic Surgery [phone] 05/13/2021, 2:51 PM

## 2021-05-13 NOTE — ED Provider Notes (Signed)
[facility] EMERGENCY DEPARTMENT Provider Note   CSN: [id number] Arrival date & time: 05/13/21  0820     History Chief Complaint  Patient presents with   Marletta Lor    Megan Glover is a 75 y.o. female.   Fall Pertinent negatives include no chest pain, no abdominal pain, no headaches and no shortness of breath. Patient presents following a fall.  Fall occurred approximately 3 AM.  She was at home and got up to use the bathroom.  She states that she has chronic issues with imbalance.  She lost her balance and fell forward.  She denies any LOC associated with this fall.  She landed on the floor and had severe discomfort to her left shoulder.  Since the fall, pain has been ongoing.  She is not able to move her left shoulder.  She denies any other areas of discomfort.  She denies any history of injury to her left shoulder.  Currently, pain is severe.  Other medical history is notable for hypothyroidism, hypertension, COPD, and anxiety.  She has not taken her home medications today, other than Klonopin.  She denies any recent symptoms prior to the fall early this morning.     Past Medical History:  Diagnosis Date   Anemia    Anxiety    Has tried Celexa and effexor, poor tolerance.   Cataract    bilateral -left > right   Clostridium difficile diarrhea    05-09-14 denies any problems at this time   COPD (chronic obstructive pulmonary disease) (HCC)    Depression    Fibromyalgia    History of diverticulosis    Hyperlipidemia    Statins aggravated myalgias:Pravastatin and Atorvastatin among some.   Hypertension    Hypothyroidism    IBS (irritable bowel syndrome)    Neuromuscular disorder (HCC)    fibromyalgia   Thyroid disease     Patient Active Problem List   Diagnosis Date Noted   Atherosclerosis of aorta (HCC) 03/05/2021   Hyponatremia 09/28/2018   External hemorrhoid 09/28/2018   Peripheral neuropathy 05/30/2018   Insomnia 07/28/2017   Back pain with  left-sided radiculopathy 03/20/2017   Atrophic vaginitis 11/17/2016   Osteoporosis 11/17/2016   COPD (chronic obstructive pulmonary disease) (HCC) 11/16/2016   PAD (peripheral artery disease) (HCC) 11/23/2015   Vitamin D deficiency, unspecified 11/23/2015   Generalized anxiety disorder 02/26/2014   Hypothyroidism 02/25/2014   Essential hypertension 02/20/2014   Hypertensive urgency 09/12/2013   Fibromyalgia    Irritable bowel syndrome with constipation     Past Surgical History:  Procedure Laterality Date   Cataract removal 2015 Bilateral    DILATION AND CURETTAGE OF UTERUS     FRACTURE SURGERY     Right femoral fx 02/22/14   INGUINAL HERNIA REPAIR Bilateral 05/16/2014   Procedure: LAPAROSCOPIC EXPLORATION AND REPAIR OF BILATERAL FEMEROL AND BILATERAL INGUINAL HERNIAS  WITH MESH;  Surgeon: Karie Soda, MD;  Location: [facility];  Service: General;  Laterality: Bilateral;   INTRAMEDULLARY (IM) NAIL INTERTROCHANTERIC Right 02/21/2014   Procedure: INTRAMEDULLARY (IM) NAIL INTERTROCHANTRIC;  Surgeon: Eulas Post, MD;  Location: [facility] OR;  Service: Orthopedics;  Laterality: Right;     OB History   No obstetric history on file.     Family History  Problem Relation Age of Onset   Breast cancer Mother        breast CA   Diabetes Father    Hypertension Father    Colon cancer Other    Breast  cancer Sister    Breast cancer Sister    Cancer Sister        breast   Cancer Sister        breast    Social History   Tobacco Use   Smoking status: Former    Packs/day: 1.50    Years: 25.00    Pack years: 37.50    Types: Cigarettes    Quit date: 05/09/1980    Years since quitting: 41.0   Smokeless tobacco: Never  Vaping Use   Vaping Use: Never used  Substance Use Topics   Alcohol use: No    Alcohol/week: 0.0 standard drinks   Drug use: No    Home Medications Prior to Admission medications   Medication Sig Start Date End Date Taking? Authorizing Provider  oxyCODONE  (ROXICODONE) 5 MG immediate release tablet Take 1 tablet (5 mg total) by mouth every 6 (six) hours as needed for up to 5 days for severe pain. 05/13/21 05/18/21 Yes Gloris Manchester, MD  acetaminophen (TYLENOL) 500 MG tablet Take 1,000 mg by mouth every 8 (eight) hours as needed for moderate pain.    [provider]  albuterol (PROVENTIL) (2.5 MG/3ML) 0.083% nebulizer solution Take 3 mLs (2.5 mg total) by nebulization every 6 (six) hours as needed for wheezing or shortness of breath. 10/30/18   [place], Betty G, MD  albuterol (VENTOLIN HFA) 108 (90 Base) MCG/ACT inhaler INHALE 2 PUFFS INTO THE LUNGS EVERY 4 HOURS AS NEEDED 02/02/21   [place], Betty G, MD  aspirin 81 MG chewable tablet Chew 81 mg by mouth.    [provider]  Biotin 1000 MCG tablet Take by mouth.    [provider]  BYSTOLIC 20 MG TABS TAKE ONE-HALF TABLET BY  MOUTH TWICE DAILY 03/30/20   [place], Betty G, MD  Cholecalciferol (VITAMIN D3) 2000 units TABS Take 1 tablet by mouth daily.    [provider]  clonazePAM (KLONOPIN) 0.5 MG tablet Take 1 tablet (0.5 mg total) by mouth 2 (two) times daily. 04/16/21   [place], Betty G, MD  hydrALAZINE (APRESOLINE) 10 MG tablet TAKE 1 TABLET BY MOUTH THREE TIMES DAILY 08/03/20   [place], Betty G, MD  levothyroxine (SYNTHROID) 50 MCG tablet TAKE 1 TABLET(50 MCG) BY MOUTH DAILY BEFORE BREAKFAST 07/03/20   [place], Betty G, MD  lidocaine (XYLOCAINE) 5 % ointment Apply 1 application topically as needed. 03/05/21   [place], Betty G, MD  losartan (COZAAR) 100 MG tablet TAKE 1 TABLET(100 MG) BY MOUTH EVERY MORNING 04/13/21   [place], Betty G, MD  meclizine (ANTIVERT) 25 MG tablet Take 1 tablet (25 mg total) by mouth daily as needed for dizziness. 03/05/21   [place], Betty G, MD  OVER THE COUNTER MEDICATION Apply 2 drops to eye at bedtime as needed (for itchy dry eyes.).    [provider]  PROCTOZONE-HC 2.5 % rectal cream USE RECTALLY EVERY DAY AS NEEDED 12/13/19   [place], Betty G, MD   rosuvastatin (CRESTOR) 10 MG tablet Take 1 tablet by mouth 3 times per week. 03/08/21   [place], Betty G, MD  tiZANidine (ZANAFLEX) 4 MG tablet Take 1 tablet (4 mg total) by mouth every 12 (twelve) hours as needed for muscle spasms. 03/05/21   [place], Betty G, MD    Allergies    Sulfa antibiotics, Demerol  [meperidine hcl], Levofloxacin in d5w, Spironolactone, Clindamycin, Clindamycin/lincomycin, Erythromycin, Erythromycin base, Fluconazole, Hydrochlorothiazide, Lincomycin, Meperidine, Ciprofloxacin, Levofloxacin, Metronidazole, and Other  Review of Systems   Review of  Systems  Constitutional:  Negative for activity change, appetite change, chills, fatigue and fever.  HENT:  Negative for congestion, ear pain and sore throat.   Eyes:  Negative for photophobia, pain, redness and visual disturbance.  Respiratory:  Negative for cough, chest tightness, shortness of breath and wheezing.   Cardiovascular:  Negative for chest pain, palpitations and leg swelling.  Gastrointestinal:  Negative for abdominal pain, diarrhea, nausea and vomiting.  Genitourinary:  Negative for dysuria, flank pain and hematuria.  Musculoskeletal:  Positive for arthralgias and joint swelling. Negative for back pain, gait problem, myalgias, neck pain and neck stiffness.  Skin:  Negative for color change and rash.  Neurological:  Negative for dizziness, seizures, syncope, speech difficulty, weakness, light-headedness, numbness and headaches.  Hematological:  Does not bruise/bleed easily.  Psychiatric/Behavioral:  Negative for confusion and decreased concentration.   All other systems reviewed and are negative.  Physical Exam Updated Vital Signs BP (!) 166/68 (BP Location: Right Arm)   Pulse 69   Temp 97.9 F (36.6 C) (Oral)   Resp 16   Wt 60.8 kg   SpO2 100%   BMI 20.68 kg/m   Physical Exam Vitals and nursing note reviewed.  Constitutional:      General: She is not in acute distress.    Appearance: She is  well-developed and normal weight. She is not ill-appearing, toxic-appearing or diaphoretic.  HENT:     Head: Normocephalic and atraumatic.     Left Ear: External ear normal.     Nose: Nose normal.     Mouth/Throat:     Mouth: Mucous membranes are moist.     Pharynx: Oropharynx is clear.  Eyes:     Conjunctiva/sclera: Conjunctivae normal.  Cardiovascular:     Rate and Rhythm: Normal rate and regular rhythm.     Heart sounds: No murmur heard. Pulmonary:     Effort: Pulmonary effort is normal. No respiratory distress.     Breath sounds: Normal breath sounds. No wheezing or rales.  Chest:     Chest wall: No tenderness.  Abdominal:     General: Abdomen is flat.     Palpations: Abdomen is soft.     Tenderness: There is no abdominal tenderness.  Musculoskeletal:        General: Tenderness, deformity and signs of injury present.     Cervical back: Normal range of motion and neck supple. No rigidity.  Skin:    General: Skin is warm and dry.     Coloration: Skin is not jaundiced or pale.  Neurological:     General: No focal deficit present.     Mental Status: She is alert and oriented to person, place, and time.     Cranial Nerves: No cranial nerve deficit.     Sensory: No sensory deficit.     Motor: No weakness.     Coordination: Coordination normal.  Psychiatric:        Mood and Affect: Mood normal.        Behavior: Behavior normal.        Thought Content: Thought content normal.        Judgment: Judgment normal.    ED Results / Procedures / Treatments   Labs (all labs ordered are listed, but only abnormal results are displayed) Labs Reviewed - No data to display  EKG None  Radiology DG Shoulder Left  Result Date: 05/13/2021 CLINICAL DATA:  75 year old female status post fall this morning landing on left shoulder. EXAM: LEFT SHOULDER -  2+ VIEW COMPARISON:  Chest radiographs 04/09/2018 and earlier. FINDINGS: Anterior, subcoracoid glenohumeral dislocation with associated  comminuted fracture of the greater tuberosity. Mild displacement of comminution fragments. The glenoid and left scapula appear to remain intact. Left clavicle appears intact. Visible left ribs appear intact, pulmonary hyperinflation suspected, clear visible left lung. IMPRESSION: Anterior left glenohumeral fracture dislocation. Comminution and mild displacement of the greater tuberosity. No scapula fracture identified. Electronically Signed   By: Odessa Fleming M.D.   On: 05/13/2021 09:11   DG Shoulder Left Portable  Result Date: 05/13/2021 CLINICAL DATA:  Second attempt postreduction image EXAM: LEFT SHOULDER COMPARISON:  Same day shoulder radiographs FINDINGS: On the current study, the humeral head remains anteroinferiorly dislocated. A comminuted fracture of the greater tuberosity is again seen. IMPRESSION: Persistent anteroinferior humeral head dislocation with a comminuted fracture of the greater tuberosity. This has been reduced on subsequent radiographs. Electronically Signed   By: Lesia Hausen M.D.   On: 05/13/2021 15:04   DG Shoulder Left Portable  Result Date: 05/13/2021 CLINICAL DATA:  Postreduction EXAM: LEFT SHOULDER COMPARISON:  05/13/2021 FINDINGS: Satisfactory shoulder reduction. Comminuted fracture of the greater tuberosity with multiple bony fragments present. There is widening of the glenohumeral joint which may be due to effusion. Degenerative change in the [facility] joint. IMPRESSION: Satisfactory shoulder reduction. Comminuted fracture greater tuberosity of the humerus. Electronically Signed   By: Marlan Palau M.D.   On: 05/13/2021 14:43    Procedures .Sedation  Date/Time: 05/13/2021 2:00 AM Performed by: Gloris Manchester, MD Authorized by: Gloris Manchester, MD   Consent:    Consent obtained:  Verbal and written   Consent given by:  Patient   Risks discussed:  Allergic reaction, dysrhythmia, prolonged hypoxia resulting in organ damage, inadequate sedation and respiratory compromise necessitating  ventilatory assistance and intubation   Alternatives discussed:  Analgesia without sedation Universal protocol:    Procedure explained and questions answered to patient or proxy's satisfaction: yes     Imaging studies available: yes     Immediately prior to procedure, a time out was called: yes   Indications:    Procedure performed:  Dislocation reduction   Procedure necessitating sedation performed by:  Physician performing sedation Pre-sedation assessment:    Time since last food or drink:  16 hours   ASA classification: class 2 - patient with mild systemic disease     Mouth opening:  2 finger widths   Thyromental distance:  3 finger widths   Mallampati score:  I - soft palate, uvula, fauces, pillars visible   Neck mobility: normal     Pre-sedation assessments completed and reviewed: airway patency, cardiovascular function, hydration status, mental status, nausea/vomiting, pain level and respiratory function     Pre-sedation assessment completed:  05/13/2021 1:30 PM Immediate pre-procedure details:    Reassessment: Patient reassessed immediately prior to procedure     Reviewed: vital signs     Verified: bag valve mask available, intubation equipment available, IV patency confirmed and oxygen available   Procedure details (see MAR for exact dosages):    Preoxygenation:  Nasal cannula   Sedation:  Etomidate and propofol   Intended level of sedation: moderate (conscious sedation)   Analgesia: roxicodone.   Intra-procedure monitoring:  Blood pressure monitoring, cardiac monitor, continuous capnometry, continuous pulse oximetry, frequent LOC assessments and frequent vital sign checks   Intra-procedure events: none     Total Provider sedation time (minutes):  30 Post-procedure details:    Post-sedation assessment completed:  05/13/2021 3:00 PM  Attendance: Constant attendance by certified staff until patient recovered     Recovery: Patient returned to pre-procedure baseline      Post-sedation assessments completed and reviewed: airway patency, cardiovascular function, hydration status, mental status, nausea/vomiting, pain level and respiratory function     Patient is stable for discharge or admission: yes     Procedure completion:  Tolerated well, no immediate complications .Ortho Injury Treatment  Date/Time: 05/13/2021 7:26 PM Performed by: Gloris Manchester, MD Authorized by: Gloris Manchester, MD   Consent:    Consent obtained:  Verbal and written   Consent given by:  Patient   Risks discussed:  Fracture, irreducible dislocation and vascular damage   Alternatives discussed:  No treatment and delayed treatmentInjury location: shoulder Location details: left shoulder Injury type: fracture-dislocation Dislocation type: anterior Fracture type: greater humeral tuberosity Pre-procedure neurovascular assessment: neurovascularly intact Pre-procedure distal perfusion: normal Pre-procedure neurological function: normal Pre-procedure range of motion: reduced  Anesthesia: Local anesthesia used: no  Patient sedated: Yes. Refer to sedation procedure documentation for details of sedation. Manipulation performed: yes Reduction successful: no Post-procedure neurovascular assessment: post-procedure neurovascularly intact Post-procedure distal perfusion: normal Post-procedure neurological function: normal Post-procedure range of motion: unchanged     Medications Ordered in ED Medications  etomidate (AMIDATE) injection 6.08 mg (0 mg Intravenous Hold 05/13/21 1409)  etomidate (AMIDATE) 2 MG/ML injection (  Not Given 05/13/21 1436)  oxyCODONE (Oxy IR/ROXICODONE) immediate release tablet 5 mg (5 mg Oral Given 05/13/21 1317)  levothyroxine (SYNTHROID) tablet 50 mcg (50 mcg Oral Given 05/13/21 1320)  losartan (COZAAR) tablet 100 mg (100 mg Oral Given 05/13/21 1320)  etomidate (AMIDATE) injection (3 mg Intravenous Given 05/13/21 1414)  propofol (DIPRIVAN) 10 mg/mL bolus/IV push (10 mg  Intravenous Given 05/13/21 1432)    ED Course  I have reviewed the triage vital signs and the nursing notes.  Pertinent labs & imaging results that were available during my care of the patient were reviewed by me and considered in my medical decision making (see chart for details).    MDM Rules/Calculators/A&P                           Patient presents for injury to her left shoulder.  This occurred during a fall at 3 AM.  Prior to being bedded in the ED, x-ray imaging showed left shoulder anterior dislocation with an associated fracture of the greater tuberosity.  On exam, patient has clear deformity to left shoulder.  Fracture dislocation is closed with no overlying break in skin.  She currently endorses severe pain.  Oxycodone was added for analgesia.  Patient was consented for sedation and reduction.  I discussed this patient with orthopedic surgery team.  They advised standard reduction and follow-up.  Initially, etomidate given for sedation.  Initial attempt at reduction was unsuccessful.  Orthopedic surgery team was reengaged.  They agreed to come to bedside for trial of reduction.  At this point, propofol was given.  Reduced dose was used due to previous administration of etomidate.  This second attempt was successful.  X-ray imaging confirmed shoulder reduction.  Sling was ordered.  Patient was advised to follow-up with orthopedic surgery in 1 week.  Patient and her son, who accompanies her at bedside were instructed on pain control.  Patient is particularly worried about this given the severe pain that she has been in since this injury.  Patient was advised to take scheduled Tylenol and NSAID medication for baseline pain control.  She was given a prescription for Roxicodone, to be taken only as needed, for severe pain.  She was highly cautioned about use of this medication, especially with her concomitant use of Klonopin and her advanced age.  Patient states that she is chronically at risk for  falls.  She was advised that use of narcotic pain medication will put her at greater risk of falls.  She is also advised that there is dangerous interactions with narcotic medication and benzodiazepines putting her at risk for respiratory depression.  Patient expressed understanding.  Patient's son stated that family members will be able to keep a close watch and help her manage her pain control regimen.  Patient was discharged in stable condition.  Final Clinical Impression(s) / ED Diagnoses Final diagnoses:  Closed fracture dislocation of left shoulder, initial encounter    Rx / DC Orders ED Discharge Orders          Ordered    oxyCODONE (ROXICODONE) 5 MG immediate release tablet  Every 6 hours PRN        05/13/21 1528             Gloris Manchester, MD 05/13/21 1934

## 2021-05-13 NOTE — Discharge Instructions (Signed)
Take scheduled (every 6 hours) Tylenol and ibuprofen for management of the ongoing soreness that you will experience in your shoulder.  Additionally, there is a prescription for narcotic pain medication that was sent to your preferred pharmacy ([facility] in [place]).  This narcotic pain medication is to be taken only as needed.  Be very cautious when taking this, especially when combined with your other medications, most notably Klonopin.  It can cause sleepiness, decreased breathing, increased risk of falls, and even death.  Call the number on this paperwork to follow-up with the shoulder doctor in 1 week.

## 2021-05-13 NOTE — Sedation Documentation (Signed)
X-ray at bedside

## 2021-05-13 NOTE — Progress Notes (Signed)
Orthopedic Tech Progress Note Patient Details:  Megan Glover [birth year] [id number]  Ortho Devices Type of Ortho Device: Shoulder immobilizer Ortho Device/Splint Location: Left arm Ortho Device/Splint Interventions: Application   Post Interventions Patient Tolerated: Well  [name] Megan Glover 05/13/2021, 4:30 PM

## 2021-05-13 NOTE — ED Notes (Signed)
Room has been set up for conscious sedation, pharmacy is ready with the meds, RT & ortho has been called.

## 2021-05-13 NOTE — Sedation Documentation (Signed)
Shoulder xray did a reshoot now & verified Left shoulder still not in correct placement.

## 2021-05-13 NOTE — ED Triage Notes (Signed)
Pt reports falling this morning when trying to walk in the dark. Pt landed on left shoulder. Pain radiates down left arm.

## 2021-05-13 NOTE — Sedation Documentation (Signed)
Conscious sedation is being repeated, pt is awake & aware, consent remains at bedside, son at bedside as well. EDP Dixon at bedside along with Ortho PA. Pharmacy has returned with propofol for this second sedation medication.

## 2021-05-13 NOTE — Sedation Documentation (Signed)
All needed personnel at bedside, for second attempt of Left shoulder reduction.

## 2021-05-13 NOTE — ED Notes (Signed)
EDP Dixon at bedside to started Left shoulder reduction, consent has been signed.

## 2021-05-20 ENCOUNTER — Telehealth: Payer: Self-pay

## 2021-05-20 DIAGNOSIS — I10 Essential (primary) hypertension: Secondary | ICD-10-CM

## 2021-05-20 NOTE — Telephone Encounter (Signed)
Patient called requesting Rx refill  BYSTOLIC 20 MG TABS Sent to [facility]

## 2021-05-21 MED ORDER — NEBIVOLOL HCL 20 MG PO TABS: 0.5000 | ORAL_TABLET | Freq: Two times a day (BID) | ORAL | 3 refills | Status: DC

## 2021-05-21 NOTE — Addendum Note (Signed)
Addended by: Kathreen Devoid on: 05/21/2021 07:46 AM   Modules accepted: Orders

## 2021-05-21 NOTE — Telephone Encounter (Signed)
Rx sent in

## 2021-06-02 ENCOUNTER — Encounter (HOSPITAL_COMMUNITY): Payer: Medicare Other

## 2021-06-03 ENCOUNTER — Encounter (HOSPITAL_COMMUNITY): Payer: Medicare Other

## 2021-06-03 ENCOUNTER — Ambulatory Visit (INDEPENDENT_AMBULATORY_CARE_PROVIDER_SITE_OTHER): Payer: Medicare Other | Admitting: Vascular Surgery

## 2021-06-03 ENCOUNTER — Encounter: Payer: Self-pay | Admitting: Vascular Surgery

## 2021-06-03 ENCOUNTER — Ambulatory Visit (HOSPITAL_COMMUNITY)
Admission: RE | Admit: 2021-06-03 | Discharge: 2021-06-03 | Disposition: A | Discharge status: Home / Self Care | Payer: Medicare Other | Source: Ambulatory Visit | Attending: Family Medicine | Admitting: Family Medicine

## 2021-06-03 ENCOUNTER — Other Ambulatory Visit: Payer: Self-pay

## 2021-06-03 VITALS — BP 180/83 | HR 75 | Temp 98.0°F | Resp 18 | Ht 67.5 in | Wt 126.0 lb

## 2021-06-03 DIAGNOSIS — I739 Peripheral vascular disease, unspecified: Secondary | ICD-10-CM | POA: Insufficient documentation

## 2021-06-03 DIAGNOSIS — I7409 Other arterial embolism and thrombosis of abdominal aorta: Secondary | ICD-10-CM | POA: Diagnosis not present

## 2021-06-03 NOTE — Progress Notes (Signed)
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  However, I think her symptoms are multifactorial.  She has fibromyalgia, degenerative disc disease of her back, and aortoiliac occlusive disease.  I think all of these issues could be contributing to her leg pain.  She does not have critical limb ischemia.  We have discussed the option of proceeding with arteriography with possible iliac angioplasty and stenting versus following this for now and only considering arteriography if her symptoms progress.  She would like to follow this for now.  If her symptoms progressed denies certainly think she would be a candidate for arteriography and possible intervention. I have reviewed with the patient the indications for arteriography. In addition, I have reviewed the potential complications of arteriography including but not limited to: Bleeding, arterial injury, arterial thrombosis, dye action, renal insufficiency, or other unpredictable medical problems. I have explained to the patient that if we find disease amenable to angioplasty we could potentially address this at the same time. I have discussed the potential complications of angioplasty and stenting, including but not limited to: Bleeding, arterial thrombosis, arterial injury, dissection, or the need for surgical intervention.  Fortunately she is not a smoker.  She is on aspirin but does not tolerate statin so she tells me she has not been taking this.  I have ordered a follow-up duplex scan in 3 months and ABIs at that time.  She knows to call sooner if her symptoms progress.  I have encouraged her to stay as active as possible.   HYPERTENSION: The patient's initial blood pressure today was elevated. We repeated this and this was still elevated.   She is also requested a rolling walker but I have asked her to follow-up with Dr.  [place] for this.  REASON FOR CONSULT:    To evaluate for peripheral vascular disease.  The consult is requested by Dr. Betty [place].  HPI:   Megan Glover is a 75 y.o. female who was referred for evaluation of peripheral arterial disease.  I have reviewed the records from the referring office.  The patient was seen on 03/05/2021.  She is followed with hypertension. 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  The patient was complaining of lower extremity pain which was felt to potentially be related to fibromyalgia or peripheral neuropathy.  However peripheral arterial disease was also in the differential diagnosis and the patient is sent for further evaluation.  On my history the patient describes mostly paresthesias in her feet both with ambulation, standing, and even at night.  I do not get any clear-cut history of hip thigh or calf claudication.  She denies any history of rest pain.  She denies any history of nonhealing wounds.  Her risk factors for peripheral vascular disease include hypertension and a family history of premature cardiovascular disease.  Her father had heart attack at age 63.  She denies any history of diabetes, hypercholesterolemia, or tobacco use.  She does have a history of degenerative disc disease of her back.  She also has a history of fibromyalgia.  Past Medical History:  Diagnosis Date   Anemia    Anxiety    Has tried Celexa and effexor, poor tolerance.  Cataract    bilateral -left > right   Clostridium difficile diarrhea    05-09-14 denies any problems at this time   COPD (chronic obstructive pulmonary disease) (HCC)    Depression    Fibromyalgia    History of diverticulosis    Hyperlipidemia    Statins aggravated myalgias:Pravastatin and Atorvastatin among some.   Hypertension     Hypothyroidism    IBS (irritable bowel syndrome)    Neuromuscular disorder (HCC)    fibromyalgia   Thyroid disease     Family History  Problem Relation Age of Onset   Breast cancer Mother        breast CA   Diabetes Father    Hypertension Father    Colon cancer Other    Breast cancer Sister    Breast cancer Sister    Cancer Sister        breast   Cancer Sister        breast    SOCIAL HISTORY: Social History   Tobacco Use   Smoking status: Former    Packs/day: 1.50    Years: 25.00    Pack years: 37.50    Types: Cigarettes    Quit date: 05/09/1980    Years since quitting: 41.0   Smokeless tobacco: Never  Substance Use Topics   Alcohol use: No    Alcohol/week: 0.0 standard drinks    Allergies  Allergen Reactions   Sulfa Antibiotics Other (See Comments) and Itching    Anal itching "anal itching"   Demerol  [Meperidine Hcl] Nausea And Vomiting   Levofloxacin In D5w Other (See Comments)    Yeast infections.  "Yeast infections"   Spironolactone Other (See Comments)    Weakness/malaise "Weakness" Other reaction(s): Other (See Comments) Weakness/malaise   Clindamycin     Other reaction(s): Other (See Comments)   Clindamycin/Lincomycin     CDIF   Erythromycin Diarrhea   Erythromycin Base     OTHER REACTION(S): Diarrhea Other reaction(s): Other (See Comments) OTHER REACTION(S): Diarrhea   Fluconazole Other (See Comments)   Hydrochlorothiazide Other (See Comments)    Weakness/malaise Other reaction(s): Other (See Comments) Weakness/malaise   Lincomycin Diarrhea    CDIF   Meperidine Nausea And Vomiting, Other (See Comments) and Hypertension    Severe headache OTHER REACTION(S): Hypertension Severe headache   Ciprofloxacin Diarrhea   Levofloxacin Nausea Only    Yeast infection OTHER REACTION(S): Diarrhea Other reaction(s): Other (See Comments) Yeast infection   Metronidazole Nausea Only    OTHER REACTION(S): Diarrhea OTHER REACTION(S): Diarrhea     Other Itching    Current Outpatient Medications  Medication Sig Dispense Refill   acetaminophen (TYLENOL) 500 MG tablet Take 1,000 mg by mouth every 8 (eight) hours as needed for moderate pain.     albuterol (PROVENTIL) (2.5 MG/3ML) 0.083% nebulizer solution Take 3 mLs (2.5 mg total) by nebulization every 6 (six) hours as needed for wheezing or shortness of breath. 150 mL 2   albuterol (VENTOLIN HFA) 108 (90 Base) MCG/ACT inhaler INHALE 2 PUFFS INTO THE LUNGS EVERY 4 HOURS AS NEEDED 8.5 g 1   aspirin 81 MG chewable tablet Chew 81 mg by mouth.     Biotin 1000 MCG tablet Take by mouth.     Cholecalciferol (VITAMIN D3) 2000 units TABS Take 1 tablet by mouth daily.     clonazePAM (KLONOPIN) 0.5 MG tablet Take 1 tablet (0.5 mg total) by mouth 2 (two) times daily. 16 tablet 0   hydrALAZINE (APRESOLINE) 10 MG tablet  TAKE 1 TABLET BY MOUTH THREE TIMES DAILY 270 tablet 1   levothyroxine (SYNTHROID) 50 MCG tablet TAKE 1 TABLET(50 MCG) BY MOUTH DAILY BEFORE BREAKFAST 90 tablet 3   lidocaine (XYLOCAINE) 5 % ointment Apply 1 application topically as needed. 50 g 2   losartan (COZAAR) 100 MG tablet TAKE 1 TABLET(100 MG) BY MOUTH EVERY MORNING 90 tablet 3   meclizine (ANTIVERT) 25 MG tablet Take 1 tablet (25 mg total) by mouth daily as needed for dizziness. 30 tablet 0   Nebivolol HCl (BYSTOLIC) 20 MG TABS Take 0.5 tablets (10 mg total) by mouth 2 (two) times daily. 90 tablet 3   OVER THE COUNTER MEDICATION Apply 2 drops to eye at bedtime as needed (for itchy dry eyes.).     PROCTOZONE-HC 2.5 % rectal cream USE RECTALLY EVERY DAY AS NEEDED 30 g 1   rosuvastatin (CRESTOR) 10 MG tablet Take 1 tablet by mouth 3 times per week. 30 tablet 3   tiZANidine (ZANAFLEX) 4 MG tablet Take 1 tablet (4 mg total) by mouth every 12 (twelve) hours as needed for muscle spasms. 30 tablet 0   No current facility-administered medications for this visit.    REVIEW OF SYSTEMS:  [X]  denotes positive finding, [ ]  denotes  negative finding Cardiac  Comments:  Chest pain or chest pressure:    Shortness of breath upon exertion: x   Short of breath when lying flat:    Irregular heart rhythm:        Vascular    Pain in calf, thigh, or hip brought on by ambulation: x   Pain in feet at night that wakes you up from your sleep:  x   Blood clot in your veins:    Leg swelling:  x       Pulmonary    Oxygen at home:    Productive cough:  x   Wheezing:  x       Neurologic    Sudden weakness in arms or legs:     Sudden numbness in arms or legs:     Sudden onset of difficulty speaking or slurred speech:    Temporary loss of vision in one eye:     Problems with dizziness:         Gastrointestinal    Blood in stool:     Vomited blood:         Genitourinary    Burning when urinating:     Blood in urine:        Psychiatric    Major depression:         Hematologic    Bleeding problems:    Problems with blood clotting too easily:        Skin    Rashes or ulcers:        Constitutional    Fever or chills:    -  PHYSICAL EXAM:   Vitals:   06/03/21 1515  BP: (!) 180/83  Pulse: 75  Resp: 18  Temp: 98 F (36.7 C)  SpO2: 96%  Weight: 126 lb (57.2 kg)  Height: 5' 7.5" (1.715 m)   Body mass index is 19.44 kg/m.  GENERAL: The patient is a well-nourished female, in no acute distress. The vital signs are documented above. CARDIAC: There is a regular rate and rhythm.  VASCULAR: I do not detect carotid bruits. She has a barely palpable right femoral pulse with a diminished left femoral pulse. I cannot palpate pedal pulses. PULMONARY: There is good  air exchange bilaterally without wheezing or rales. ABDOMEN: Soft and non-tender with normal pitched bowel sounds.  I do not palpate any aneurysm. MUSCULOSKELETAL: There are no major deformities. NEUROLOGIC: No focal weakness or paresthesias are detected. SKIN: There are no ulcers or rashes noted. PSYCHIATRIC: The patient has a normal affect.  DATA:     ARTERIAL DOPPLER STUDY: I have independently interpreted the patient's arterial Doppler study.  On the right side, there is a biphasic posterior tibial signal with a monophasic dorsalis pedis signal.  ABI is 48%.  Toe pressure 72 mmHg.  On the left side there is a triphasic dorsalis pedis signal with a biphasic posterior tibial signal.  ABIs 93%.  Toe pressures 123 mmHg.  LABS: I reviewed the labs from 03/05/2021.  Creatinine was normal at 0.75.  GFR was greater than 60.  LDL cholesterol 85.  HDL cholesterol 60.   Waverly Ferrari [facility]

## 2021-06-04 ENCOUNTER — Other Ambulatory Visit: Payer: Self-pay

## 2021-06-04 ENCOUNTER — Telehealth: Payer: Self-pay | Admitting: Family Medicine

## 2021-06-04 DIAGNOSIS — I739 Peripheral vascular disease, unspecified: Secondary | ICD-10-CM

## 2021-06-04 NOTE — Telephone Encounter (Signed)
Spoke to patient to schedule awv She wanted call back at first of year  She is having a lot going on right now

## 2021-06-12 ENCOUNTER — Other Ambulatory Visit: Payer: Self-pay | Admitting: Family Medicine

## 2021-06-12 DIAGNOSIS — F411 Generalized anxiety disorder: Secondary | ICD-10-CM

## 2021-06-14 NOTE — Telephone Encounter (Signed)
Last filled 05/10/21 Next OV is 07/02/21

## 2021-06-18 ENCOUNTER — Other Ambulatory Visit: Payer: Self-pay | Admitting: Family Medicine

## 2021-06-18 DIAGNOSIS — R42 Dizziness and giddiness: Secondary | ICD-10-CM

## 2021-06-27 ENCOUNTER — Other Ambulatory Visit: Payer: Self-pay | Admitting: Family Medicine

## 2021-06-27 DIAGNOSIS — M542 Cervicalgia: Secondary | ICD-10-CM

## 2021-07-02 ENCOUNTER — Telehealth: Payer: Self-pay | Admitting: Family Medicine

## 2021-07-02 ENCOUNTER — Ambulatory Visit (INDEPENDENT_AMBULATORY_CARE_PROVIDER_SITE_OTHER): Payer: Medicare Other | Admitting: Family Medicine

## 2021-07-02 ENCOUNTER — Encounter: Payer: Self-pay | Admitting: Family Medicine

## 2021-07-02 VITALS — BP 142/68 | HR 78 | Temp 98.0°F | Resp 16 | Ht 67.5 in | Wt 126.4 lb

## 2021-07-02 DIAGNOSIS — I10 Essential (primary) hypertension: Secondary | ICD-10-CM

## 2021-07-02 DIAGNOSIS — L659 Nonscarring hair loss, unspecified: Secondary | ICD-10-CM

## 2021-07-02 DIAGNOSIS — I739 Peripheral vascular disease, unspecified: Secondary | ICD-10-CM

## 2021-07-02 DIAGNOSIS — F411 Generalized anxiety disorder: Secondary | ICD-10-CM

## 2021-07-02 DIAGNOSIS — K581 Irritable bowel syndrome with constipation: Secondary | ICD-10-CM

## 2021-07-02 DIAGNOSIS — R296 Repeated falls: Secondary | ICD-10-CM

## 2021-07-02 MED ORDER — ROSUVASTATIN CALCIUM 10 MG PO TABS: 10.0000 mg | ORAL_TABLET | Freq: Every day | ORAL | 3 refills | Status: DC

## 2021-07-02 MED ORDER — METOPROLOL TARTRATE 25 MG PO TABS: 25.0000 mg | ORAL_TABLET | Freq: Two times a day (BID) | ORAL | 3 refills | Status: DC

## 2021-07-02 NOTE — Progress Notes (Addendum)
Chief Complaint  Patient presents with   Follow-up   HPI: [name] is a 75 y.o. female, who is here today for follow up.  She was last seen on 03/05/21. Several falls since her last visit, last one on [id number] complicated by closed fracture dislocation of left shoulder. S/P reduction under anesthesia. She is following with Dr Megan Glover.  She is doing PT at home, left shoulder, she feels like she is gradually getting better. Fibromyalgia, generalized OA,and unstable gait. She uses a cane for transfer. She is not driving.  C/O hair loss for the past few weeks, she thinks it is because Bystolic was changed from brand name to generic.  She is still under a lot of stress, legal fight with one of her son's, he is leaving in a house for which she is still paying mortgage and he is not helping with expenses. She is on Clonazepam 0.5 mg bid. She has been on same medication for years and still helping.  Hypertension:  Medications:She is on Bystolic 20 mg 1/2 tab bid, Hydralazine 10 mg tid,and Losartan 100 mg daily. She has had side effects with other antihypertensives in the past. BP readings at home:Not checking, it causes anxiety.  Negative for unusual or severe headache, visual changes, exertional chest pain, dyspnea, palpitations, focal weakness, or edema.  Lab Results  Component Value Date   CREATININE 0.75 03/05/2021   BUN 13 03/05/2021   NA 133 (L) 03/05/2021   K 4.7 03/05/2021   CL 97 03/05/2021   CO2 28 03/05/2021   HLD and PAD: She has not tolerated statin medications, last one she tried Pravastatin caused myalgias. She has not tolerated Atorvastatin either. States that she did not try Crestor. She is on Aspirin 81 mg daily. She has not noted cyanosis or LE's, "always" cold, stable for years. She has had LE's pain, thought to be mostly caused by peripheral neuropathy,radicular pain,and fibromyalgia. Bilateral feet burning sensation, mainly at night when she is  in bed.  She was recently evaluated by vascular,06/03/21, angioplasty and stenting recommended but she is not interested in having procedure done at this time. She is not sure what is the next step. She has not noted LE ulcers. She wonders if she tries to take statin daily if she still will need vascular procedure.  ABI on 06/03/21: Right: Resting right ankle-brachial index indicates severe right lower extremity arterial disease. The right toe-brachial index is abnormal.  Left: Resting left ankle-brachial index indicates mild left lower extremity arterial disease. The left toe-brachial index is abnormal.   IBS: She had an episode of diarrhea today at 9 am and lower abdominal cramps. She states that this is not unusual, she has constipation and occasionally episodes of loose stools that usually last one day. Negative for fever,chills, blood in stool,melena,or urinary symptoms.  Lab Results  Component Value Date   CHOL 158 03/05/2021   HDL 59.50 03/05/2021   LDLCALC 85 03/05/2021   TRIG 68.0 03/05/2021   CHOLHDL 3 03/05/2021   Review of Systems  Constitutional:  Positive for activity change and fatigue.  Respiratory:  Negative for cough and wheezing.   Genitourinary:  Negative for decreased urine volume, dysuria and hematuria.  Musculoskeletal:  Positive for back pain, gait problem and myalgias.  Neurological:  Negative for syncope and facial asymmetry.  Psychiatric/Behavioral:  Negative for confusion. The patient is nervous/anxious.   Rest see pertinent positives and negatives per HPI.  Current Outpatient Medications on File Prior to Visit  Medication Sig Dispense Refill   acetaminophen (TYLENOL) 500 MG tablet Take 1,000 mg by mouth every 8 (eight) hours as needed for moderate pain.     albuterol (PROVENTIL) (2.5 MG/3ML) 0.083% nebulizer solution Take 3 mLs (2.5 mg total) by nebulization every 6 (six) hours as needed for wheezing or shortness of breath. 150 mL 2   albuterol (VENTOLIN  HFA) 108 (90 Base) MCG/ACT inhaler INHALE 2 PUFFS INTO THE LUNGS EVERY 4 HOURS AS NEEDED 8.5 g 1   aspirin 81 MG chewable tablet Chew 81 mg by mouth.     Biotin 1000 MCG tablet Take by mouth.     Cholecalciferol (VITAMIN D3) 2000 units TABS Take 1 tablet by mouth daily.     clonazePAM (KLONOPIN) 0.5 MG tablet TAKE 1 TABLET(0.5 MG) BY MOUTH TWICE DAILY AS NEEDED FOR ANXIETY 60 tablet 3   hydrALAZINE (APRESOLINE) 10 MG tablet TAKE 1 TABLET BY MOUTH THREE TIMES DAILY 270 tablet 1   levothyroxine (SYNTHROID) 50 MCG tablet TAKE 1 TABLET(50 MCG) BY MOUTH DAILY BEFORE BREAKFAST 90 tablet 3   losartan (COZAAR) 100 MG tablet TAKE 1 TABLET(100 MG) BY MOUTH EVERY MORNING 90 tablet 3   OVER THE COUNTER MEDICATION Apply 2 drops to eye at bedtime as needed (for itchy dry eyes.).     PROCTOZONE-HC 2.5 % rectal cream USE RECTALLY EVERY DAY AS NEEDED 30 g 1   tiZANidine (ZANAFLEX) 4 MG tablet TAKE 1 TABLET(4 MG) BY MOUTH EVERY 12 HOURS AS NEEDED FOR MUSCLE SPASMS 30 tablet 0   lidocaine (XYLOCAINE) 5 % ointment Apply 1 application topically as needed. (Patient not taking: Reported on 07/02/2021) 50 g 2   meclizine (ANTIVERT) 25 MG tablet TAKE 1 TABLET(25 MG) BY MOUTH DAILY AS NEEDED FOR DIZZINESS (Patient not taking: Reported on 07/02/2021) 30 tablet 0   No current facility-administered medications on file prior to visit.   Past Medical History:  Diagnosis Date   Anemia    Anxiety    Has tried Celexa and effexor, poor tolerance.   Cataract    bilateral -left > right   Clostridium difficile diarrhea    05-09-14 denies any problems at this time   COPD (chronic obstructive pulmonary disease) (HCC)    Depression    Fibromyalgia    History of diverticulosis    Hyperlipidemia    Statins aggravated myalgias:Pravastatin and Atorvastatin among some.   Hypertension    Hypothyroidism    IBS (irritable bowel syndrome)    Neuromuscular disorder (HCC)    fibromyalgia   Thyroid disease    Allergies  Allergen  Reactions   Sulfa Antibiotics Other (See Comments) and Itching    Anal itching "anal itching"   Demerol  [Meperidine Hcl] Nausea And Vomiting   Levofloxacin In D5w Other (See Comments)    Yeast infections.  "Yeast infections"   Spironolactone Other (See Comments)    Weakness/malaise "Weakness" Other reaction(s): Other (See Comments) Weakness/malaise   Clindamycin     Other reaction(s): Other (See Comments)   Clindamycin/Lincomycin     CDIF   Erythromycin Diarrhea   Erythromycin Base     OTHER REACTION(S): Diarrhea Other reaction(s): Other (See Comments) OTHER REACTION(S): Diarrhea   Fluconazole Other (See Comments)   Hydrochlorothiazide Other (See Comments)    Weakness/malaise Other reaction(s): Other (See Comments) Weakness/malaise   Lincomycin Diarrhea    CDIF   Meperidine Nausea And Vomiting, Other (See Comments) and Hypertension    Severe headache OTHER REACTION(S): Hypertension Severe headache   Ciprofloxacin  Diarrhea   Levofloxacin Nausea Only    Yeast infection OTHER REACTION(S): Diarrhea Other reaction(s): Other (See Comments) Yeast infection   Metronidazole Nausea Only    OTHER REACTION(S): Diarrhea OTHER REACTION(S): Diarrhea    Other Itching    Social History   Socioeconomic History   Marital status: Married    Spouse name: Zaylynn Rickett   Number of children: Not on file   Years of education: Not on file   Highest education level: Not on file  Occupational History   Not on file  Tobacco Use   Smoking status: Former    Packs/day: 1.50    Years: 25.00    Pack years: 37.50    Types: Cigarettes    Quit date: 05/09/1980    Years since quitting: 41.1   Smokeless tobacco: Never  Vaping Use   Vaping Use: Never used  Substance and Sexual Activity   Alcohol use: No    Alcohol/week: 0.0 standard drinks   Drug use: No   Sexual activity: Not Currently  Other Topics Concern   Not on file  Social History Narrative   Not on file   Social  Determinants of Health   Financial Resource Strain: Not on file  Food Insecurity: Not on file  Transportation Needs: Not on file  Physical Activity: Not on file  Stress: Not on file  Social Connections: Not on file   Vitals:   07/02/21 1422  BP: (!) 142/68  Pulse: 78  Resp: 16  Temp: 98 F (36.7 C)  SpO2: 100%   Wt Readings from Last 3 Encounters:  07/02/21 126 lb 6.4 oz (57.3 kg)  06/03/21 126 lb (57.2 kg)  05/13/21 134 lb (60.8 kg)   Body mass index is 19.5 kg/m.  Physical Exam Vitals and nursing note reviewed.  Constitutional:      General: She is not in acute distress.    Appearance: She is well-developed.  HENT:     Head: Normocephalic and atraumatic.     Mouth/Throat:     Mouth: Mucous membranes are moist.     Pharynx: Oropharynx is clear.  Eyes:     Conjunctiva/sclera: Conjunctivae normal.  Cardiovascular:     Rate and Rhythm: Normal rate and regular rhythm.     Heart sounds: No murmur heard.    Comments: I barely felt right DP pulse. Left DP present. Pulmonary:     Effort: Pulmonary effort is normal. No respiratory distress.     Breath sounds: Normal breath sounds.  Abdominal:     Palpations: Abdomen is soft. There is no hepatomegaly or mass.     Tenderness: There is no abdominal tenderness.  Lymphadenopathy:     Cervical: No cervical adenopathy.  Skin:    General: Skin is warm.     Findings: No erythema or rash.     Comments: Thin hair, no scalp lesions.  Neurological:     General: No focal deficit present.     Mental Status: She is alert and oriented to person, place, and time.     Cranial Nerves: No cranial nerve deficit.     Comments: Unstable gait assisted with a cane.  Psychiatric:        Mood and Affect: Mood is anxious.     Comments: Well groomed, good eye contact.   ASSESSMENT AND PLAN:  Ms.Nirvana was seen today for follow-up.  Diagnoses and all orders for this visit:  Hair loss disorder I am not certain medication, Bystolic,is  causing hair loss.  Anxiety and stress for the past few months may be a contributing factor. Will see if stopping Bystolic helps.  PAD (peripheral artery disease) ([place]) She is not sure if she wants to proceed with vascular procedure, strongly recommend considering doing so.We discussed possible complications of PAD. She agrees with trying Crestor 10 mg daily, we discussed CV benefits.  Continue Aspirin 81 mg daily. I am not sure if she will benefit from Cilostazol at this point. Continue daily activity as tolerated. She was clearly instructed about warning signs. Continue following with vascular.   Essential hypertension She has not tolerated different antihypertensive medications. Because she thinks Bystolic is causing her hair loss, she would like to change to a different beta-blocker.We could increase dose of Hydralazine, she has not tolerated higher dose.  Metoprolol titrate 25 mg twice daily recommended, she will discontinue Bystolic. Side effects discussed. No changes in losartan or hydralazine. Checking BP at home causes anxiety. Continue low-salt diet. She does not want to follow in 3-4 weeks, agrees with 6 weeks follow up, before if needed Instructed about warning signs.   Generalized anxiety disorder Problem is otherwise stable since her last visit. She has not tolerated SSRIs or SNRIs in the past. Continue clonazepam 0.5 mg twice daily as needed.   Irritable bowel syndrome with constipation Mainly constipation but reporting that sometimes she gets diarrhea, not frequent. Adequate hydration and fiber intake to continue. Instructed about warning signs.  Frequent falls She has not fallen since 04/2021. Fall precautions discussed. Continue PT.  I spent a total of 51 minutes in both face to face and non face to face activities for this visit on the date of this encounter. During this time history was obtained and documented, examination was performed, prior labs/imaging  reviewed, and assessment/plan discussed. Noted about 8 Lb wt loss since 04/2021, she is eating less, smaller portions.Encouraged to increase oral intake.Will continue following.  Return in about 6 weeks (around 08/13/2021).  Kirin Pastorino G. [place], MD  [facility]. [place] office.

## 2021-07-02 NOTE — Assessment & Plan Note (Addendum)
She has not tolerated different antihypertensive medications. Because she thinks Bystolic is causing her hair loss, she would like to change to a different beta-blocker.We could increase dose of Hydralazine, she has not tolerated higher dose.  Metoprolol titrate 25 mg twice daily recommended, she will discontinue Bystolic. Side effects discussed. No changes in losartan or hydralazine. Checking BP at home causes anxiety. Continue low-salt diet. She does not want to follow in 3-4 weeks, agrees with 6 weeks follow up, before if needed Instructed about warning signs.

## 2021-07-02 NOTE — Telephone Encounter (Signed)
Rx corrected.

## 2021-07-02 NOTE — Assessment & Plan Note (Signed)
Mainly constipation but reporting that sometimes she gets diarrhea, not frequent. Adequate hydration and fiber intake to continue. Instructed about warning signs.

## 2021-07-02 NOTE — Assessment & Plan Note (Addendum)
She is not sure if she wants to proceed with vascular procedure, strongly recommend considering doing so.We discussed possible complications of PAD. She agrees with trying Crestor 10 mg daily, we discussed CV benefits.  Continue Aspirin 81 mg daily. I am not sure if she will benefit from Cilostazol at this point. Continue daily activity as tolerated. She was clearly instructed about warning signs. Continue following with vascular.

## 2021-07-02 NOTE — Patient Instructions (Addendum)
A few things to remember from today's visit:  Essential hypertension - Plan: metoprolol tartrate (LOPRESSOR) 25 MG tablet  PAD (peripheral artery disease) (HCC) - Plan: rosuvastatin (CRESTOR) 10 MG tablet  Generalized anxiety disorder  Hyperlipidemia, unspecified hyperlipidemia type  If you need refills please call your pharmacy. Do not use My Chart to request refills or for acute issues that need immediate attention.   Stop Bystolic and start Metoprolol 25 mg 2 times daily. Start Crestor 10 mg daily. Continue Aspirin.  Please be sure medication list is accurate. If a new problem present, please set up appointment sooner than planned today.

## 2021-07-02 NOTE — Assessment & Plan Note (Signed)
Problem is otherwise stable since her last visit. She has not tolerated SSRIs or SNRIs in the past. Continue clonazepam 0.5 mg twice daily as needed.

## 2021-07-02 NOTE — Telephone Encounter (Signed)
Megan Glover is calling and needs clarification on rosuvastatin 10 mg there is 2 different directions

## 2021-07-05 ENCOUNTER — Other Ambulatory Visit: Payer: Self-pay

## 2021-07-05 DIAGNOSIS — I739 Peripheral vascular disease, unspecified: Secondary | ICD-10-CM

## 2021-07-05 MED ORDER — ROSUVASTATIN CALCIUM 10 MG PO TABS: 10.0000 mg | ORAL_TABLET | Freq: Every day | ORAL | 3 refills | Status: DC

## 2021-07-07 ENCOUNTER — Telehealth: Payer: Self-pay

## 2021-07-07 MED ORDER — NEBIVOLOL HCL 20 MG PO TABS: 10.0000 mg | ORAL_TABLET | Freq: Two times a day (BID) | ORAL | 3 refills | Status: DC

## 2021-07-07 NOTE — Telephone Encounter (Signed)
-----   Message from Betty G [place], MD sent at 07/07/2021 11:25 AM EST ----- Can you please call Ms. Nyborg and ask her how she is doing with new medication, Metoprolol 25 mg bid, she was supposed to stop Bystolic. She also was supposed to start Crestor. Thanks, BJ

## 2021-07-07 NOTE — Telephone Encounter (Signed)
I called and spoke with patient. She is not going to take the new blood pressure medications due to the side effects. She is going to go back to taking her Bystolic daily. She mentioned that she did talk with her surgeon yesterday and they were suppose to send her in pain medication. I advised her that I didn't see where any medications had been sent in, but to call their office and let them know the prescription wasn't received.   Patient also received an Rx for a walker that she will give to PT when they come.   She has mailed a letter for pcp to review once we receive it.

## 2021-07-09 ENCOUNTER — Other Ambulatory Visit: Payer: Self-pay | Admitting: Family Medicine

## 2021-07-13 ENCOUNTER — Telehealth: Payer: Self-pay

## 2021-07-13 DIAGNOSIS — M5410 Radiculopathy, site unspecified: Secondary | ICD-10-CM

## 2021-07-13 DIAGNOSIS — G629 Polyneuropathy, unspecified: Secondary | ICD-10-CM

## 2021-07-13 DIAGNOSIS — M797 Fibromyalgia: Secondary | ICD-10-CM

## 2021-07-13 DIAGNOSIS — M810 Age-related osteoporosis without current pathological fracture: Secondary | ICD-10-CM

## 2021-07-13 NOTE — Telephone Encounter (Signed)
[facility] from [facility] [phone]  Patient needs Rx for rolator walker sent to any DME office

## 2021-07-13 NOTE — Addendum Note (Signed)
Addended by: Kathreen Devoid on: 07/13/2021 04:56 PM   Modules accepted: Orders

## 2021-07-13 NOTE — Telephone Encounter (Signed)
Order placed & message sent to [facility]. They will arrange delivery.

## 2021-08-02 ENCOUNTER — Other Ambulatory Visit: Payer: Self-pay | Admitting: Family Medicine

## 2021-08-02 DIAGNOSIS — R42 Dizziness and giddiness: Secondary | ICD-10-CM

## 2021-08-06 ENCOUNTER — Other Ambulatory Visit: Payer: Self-pay | Admitting: Family Medicine

## 2021-08-20 ENCOUNTER — Encounter: Payer: Self-pay | Admitting: Family Medicine

## 2021-08-20 ENCOUNTER — Other Ambulatory Visit: Payer: Self-pay

## 2021-08-20 ENCOUNTER — Telehealth (INDEPENDENT_AMBULATORY_CARE_PROVIDER_SITE_OTHER): Payer: Medicare Other | Admitting: Family Medicine

## 2021-08-20 VITALS — Ht 67.5 in

## 2021-08-20 DIAGNOSIS — R42 Dizziness and giddiness: Secondary | ICD-10-CM

## 2021-08-20 DIAGNOSIS — L659 Nonscarring hair loss, unspecified: Secondary | ICD-10-CM | POA: Diagnosis not present

## 2021-08-20 DIAGNOSIS — I10 Essential (primary) hypertension: Secondary | ICD-10-CM | POA: Diagnosis not present

## 2021-08-20 DIAGNOSIS — I739 Peripheral vascular disease, unspecified: Secondary | ICD-10-CM | POA: Diagnosis not present

## 2021-08-20 MED ORDER — HYDRALAZINE HCL 10 MG PO TABS: 20.0000 mg | ORAL_TABLET | Freq: Three times a day (TID) | ORAL | 1 refills | Status: DC

## 2021-08-20 MED ORDER — MECLIZINE HCL 25 MG PO TABS: ORAL_TABLET | ORAL | 0 refills | Status: DC

## 2021-08-20 NOTE — Assessment & Plan Note (Signed)
We discussed some side effects of beta-blockers. She has not tolerated different medication in the past. She agrees with increasing dose of hydralazine from 10 mg 3 times daily to 20 mg 3 times daily and to wean off Bystolic. She will decrease dose of Bystolic 20 mg from one half twice daily to one half daily for 7 days and then every other day for 7 days. No changes in losartan dose. Continue low-salt diet. Instructed about warning signs.

## 2021-08-20 NOTE — Progress Notes (Signed)
Virtual Visit via Telephone Note I connected with Megan Glover on 08/20/21 at  3:00 PM EST by telephone and verified that I am speaking with the correct person using two identifiers.   I discussed the limitations, risks, security and privacy concerns of performing an evaluation and management service by telephone and the availability of in person appointments. I also discussed with the patient that there may be a patient responsible charge related to this service. The patient expressed understanding and agreed to proceed.  Location patient: home Location provider: work or office Participants present for the call: patient, provider Patient did not have a visit in the prior 7 days to address this/these issue(s).  Chief Complaint  Patient presents with   Medication Management    Blood pressure medication causing hair loss.   History of Present Illness: Megan Glover is a 76 y.o.female with hx of HTN,PAD,HLD,back pain,hearing loss,neuropathy,and anxiety c/o hair loss, which has been going on for months now and stable. She was last seen on 07/02/21. She still feels like Bystolic is causing her loss, so she would like to discontinue and try a different beta-blocker. She has been under a lot of stress, health issues, financial concerns,and legal process against his son.  Last visit metoprolol tartrate was recommended, she was concerned about possible side effects, so decided not to take it and continued Bystolic.  She is currently on hydralazine 10 mg 3 times daily, Bystolic 20 mg 1/2 tablet twice daily, and losartan 100 mg daily. She has tried different antihypertensive medication, most have been poor tolerated. She does not check BP at home because it aggravates her anxiety.  Lab Results  Component Value Date   CREATININE 0.75 03/05/2021   BUN 13 03/05/2021   NA 133 (L) 03/05/2021   K 4.7 03/05/2021   CL 97 03/05/2021   CO2 28 03/05/2021   Lower extremity pain has been  stable for years, numbness like sensation, bilateral,exacerbated by prolonged walking, alleviated after 1 to 2 minutes of rest. She has not noted cyanosis or skin lesions. PAD: She has not decided if she wants to pursue vascular procedures, stenting, for right aortoiliac atherosclerosis,severe. She met with vascular a few months ago, she is very afraid of possible complications and did not feel like all her questions about procedure were answered, she is considering  to see a different vascular. For now she prefers to wait because she is recovering from left humeral fracture, following with ortho, home PT has helped. She is still taking Crestor 10 mg daily, it has been well-tolerated.  She is requesting refills for meclizine.  Having intermittent episodes of lightheadedness, problem has been going on for years. She has not identified exacerbating or alleviating factors. Sometimes spinning sensation others like unbalance feeling.  She has taken Meclizine before and thinks it has helped. No falls sine her last visit.  Observations/Objective: Patient sounds cheerful and well on the phone. I do not appreciate any SOB. Speech and thought processing are grossly intact.+ Anxious. Patient reported vitals:Ht 5' 7.5" (1.715 m)    BMI 19.50 kg/m   Assessment and Plan:  Hair loss disorder We discussed possible etiologies. ? Telogen effluvium. She has been on Bystolic for many years, so I am not sure this is causing problem. Bystolic to be wean off.  PAD (peripheral artery disease) ([place]) With stable claudication,numbness, no pain. We discussed Dx,prognosis,and complications. We reviewed vascular recommendations, tried to explained stenting procedure.  The reason she is not considering vascular procedure  is because she was not happy with last appt with vascular. She feels like problem is not getting worse and concerned about possible complications. I offered referral to another vascular but I  cannot ensure she will be happier with a different provider, so after a long discussion, she finally agrees with continuing following with Dr Scot Dock and to consider vascular procedure.She will call to arrange appt. Continue Crestor 10 mg daily and Aspirin 81 mg daily. Clearly instructed about warning signs.   Essential hypertension We discussed some side effects of beta-blockers. She has not tolerated different medication in the past. She agrees with increasing dose of hydralazine from 10 mg 3 times daily to 20 mg 3 times daily and to wean off Bystolic. She will decrease dose of Bystolic 20 mg from one half twice daily to one half daily for 7 days and then every other day for 7 days. No changes in losartan dose. Continue low-salt diet. Instructed about warning signs.  Dizziness This is a chronic problem, describes some episodes as "spinning", so meclizine may help. We discussed some side effects, including increasing risk for falls. Recommend meclizine 25 mg at bedtime for 5 to 7 days and if it does not help with dizziness, recommend discontinuing it. Fall precaution discussed. Adequate hydration. Instructed about warning signs.  Follow Up Instructions:  Return in about 3 months (around 11/18/2021).  I did not refer this patient for an OV in the next 24 hours for this/these issue(s).  I discussed the assessment and treatment plan with the patient. The patient was provided an opportunity to ask questions and all were answered. The patient agreed with the plan and demonstrated an understanding of the instructions. She repeated all instructions back to me.   The patient was advised to call back or seek an in-person evaluation if the symptoms worsen or if the condition fails to improve as anticipated.  I provided  45 minutes of non-face-to-face time during this encounter.  Emberlynn Riggan G. [place], MD  [facility]. [place] office.

## 2021-08-20 NOTE — Assessment & Plan Note (Addendum)
With stable claudication,numbness, no pain. We discussed Dx,prognosis,and complications. We reviewed vascular recommendations, tried to explained stenting procedure.  The reason she is not considering vascular procedure is because she was not happy with last appt with vascular. She feels like problem is not getting worse and concerned about possible complications. I offered referral to another vascular but I cannot ensure she will be happier with a different provider, so after a long discussion, she finally agrees with continuing following with Dr Scot Dock and to consider vascular procedure.She will call to arrange appt. Continue Crestor 10 mg daily and Aspirin 81 mg daily. Clearly instructed about warning signs.

## 2021-08-20 NOTE — Assessment & Plan Note (Addendum)
This is a chronic problem, describes some episodes as "spinning", so meclizine may help. We discussed some side effects, including increasing risk for falls. Recommend meclizine 25 mg at bedtime for 5 to 7 days and if it does not help with dizziness, recommend discontinuing it. Fall precaution discussed. Adequate hydration. Instructed about warning signs.

## 2021-08-30 ENCOUNTER — Ambulatory Visit (INDEPENDENT_AMBULATORY_CARE_PROVIDER_SITE_OTHER): Payer: Medicare Other

## 2021-08-30 VITALS — Ht 67.0 in | Wt 120.0 lb

## 2021-08-30 DIAGNOSIS — Z0000 Encounter for general adult medical examination without abnormal findings: Secondary | ICD-10-CM | POA: Diagnosis not present

## 2021-08-30 NOTE — Progress Notes (Signed)
I connected with Megan Glover today by telephone and verified that I am speaking with the correct person using two identifiers. Location patient: home Location provider: work Persons participating in the virtual visit: [name], Nathanson LPN.   I discussed the limitations, risks, security and privacy concerns of performing an evaluation and management service by telephone and the availability of in person appointments. I also discussed with the patient that there may be a patient responsible charge related to this service. The patient expressed understanding and verbally consented to this telephonic visit.    Interactive audio and video telecommunications were attempted between this provider and patient, however failed, due to patient having technical difficulties OR patient did not have access to video capability.  We continued and completed visit with audio only.     Vital signs may be patient reported or missing.  Subjective:   Megan Glover is a 76 y.o. female who presents for Medicare Annual (Subsequent) preventive examination.  Review of Systems     Cardiac Risk Factors include: advanced age (>54men, >84 women);hypertension     Objective:    Today's Vitals   08/30/21 1427 08/30/21 1428  Weight: 120 lb (54.4 kg)   Height: 5\' 7"  (1.702 m)   PainSc:  3    Body mass index is 18.79 kg/m.  Advanced Directives 08/30/2021 06/23/2020 06/02/2016 05/09/2014 09/13/2013  Does Patient Have a Medical Advance Directive? Yes Yes Yes Yes Patient does not have advance directive  Type of Advance Directive Living will - (No Data) Living will -  Does patient want to make changes to medical advance directive? - No - Patient declined - - -  Copy of Healthcare Power of Attorney in Chart? - - - (No Data) -    Current Medications (verified) Outpatient Encounter Medications as of 08/30/2021  Medication Sig   acetaminophen (TYLENOL) 500 MG tablet Take 1,000 mg by mouth every 8  (eight) hours as needed for moderate pain.   albuterol (PROVENTIL) (2.5 MG/3ML) 0.083% nebulizer solution Take 3 mLs (2.5 mg total) by nebulization every 6 (six) hours as needed for wheezing or shortness of breath.   albuterol (VENTOLIN HFA) 108 (90 Base) MCG/ACT inhaler INHALE 2 PUFFS INTO THE LUNGS EVERY 4 HOURS AS NEEDED   aspirin 81 MG chewable tablet Chew 81 mg by mouth.   Biotin 1000 MCG tablet Take by mouth.   Cholecalciferol (VITAMIN D3) 2000 units TABS Take 1 tablet by mouth daily.   clonazePAM (KLONOPIN) 0.5 MG tablet TAKE 1 TABLET(0.5 MG) BY MOUTH TWICE DAILY AS NEEDED FOR ANXIETY   hydrALAZINE (APRESOLINE) 10 MG tablet Take 2 tablets (20 mg total) by mouth 3 (three) times daily.   levothyroxine (SYNTHROID) 50 MCG tablet TAKE 1 TABLET(50 MCG) BY MOUTH DAILY BEFORE BREAKFAST   losartan (COZAAR) 100 MG tablet TAKE 1 TABLET(100 MG) BY MOUTH EVERY MORNING   meclizine (ANTIVERT) 25 MG tablet TAKE 1 TABLET(25 MG) BY MOUTH DAILY AS NEEDED FOR DIZZINESS   OVER THE COUNTER MEDICATION Apply 2 drops to eye at bedtime as needed (for itchy dry eyes.).   PROCTOZONE-HC 2.5 % rectal cream USE RECTALLY EVERY DAY AS NEEDED   rosuvastatin (CRESTOR) 10 MG tablet Take 1 tablet (10 mg total) by mouth daily.   lidocaine (XYLOCAINE) 5 % ointment Apply 1 application topically as needed. (Patient not taking: Reported on 07/02/2021)   tiZANidine (ZANAFLEX) 4 MG tablet TAKE 1 TABLET(4 MG) BY MOUTH EVERY 12 HOURS AS NEEDED FOR MUSCLE SPASMS (Patient not taking:  Reported on 08/30/2021)   No facility-administered encounter medications on file as of 08/30/2021.    Allergies (verified) Sulfa antibiotics, Demerol  [meperidine hcl], Levofloxacin in d5w, Spironolactone, Clindamycin, Clindamycin/lincomycin, Erythromycin, Erythromycin base, Fluconazole, Hydrochlorothiazide, Lincomycin, Meperidine, Ciprofloxacin, Levofloxacin, Metronidazole, and Other   History: Past Medical History:  Diagnosis Date   Anemia    Anxiety     Has tried Celexa and effexor, poor tolerance.   Cataract    bilateral -left > right   Clostridium difficile diarrhea    05-09-14 denies any problems at this time   COPD (chronic obstructive pulmonary disease) (HCC)    Depression    Fibromyalgia    History of diverticulosis    Hyperlipidemia    Statins aggravated myalgias:Pravastatin and Atorvastatin among some.   Hypertension    Hypothyroidism    IBS (irritable bowel syndrome)    Neuromuscular disorder (HCC)    fibromyalgia   Thyroid disease    Past Surgical History:  Procedure Laterality Date   Cataract removal 2015 Bilateral    CLAVICLE SURGERY Left    September 2022   DILATION AND CURETTAGE OF UTERUS     FRACTURE SURGERY     Right femoral fx 02/22/14   INGUINAL HERNIA REPAIR Bilateral 05/16/2014   Procedure: LAPAROSCOPIC EXPLORATION AND REPAIR OF BILATERAL FEMEROL AND BILATERAL INGUINAL HERNIAS  WITH MESH;  Surgeon: Karie SodaSteven Gross, MD;  Location: [facility];  Service: General;  Laterality: Bilateral;   INTRAMEDULLARY (IM) NAIL INTERTROCHANTERIC Right 02/21/2014   Procedure: INTRAMEDULLARY (IM) NAIL INTERTROCHANTRIC;  Surgeon: Eulas PostJoshua P Landau, MD;  Location: [facility];  Service: Orthopedics;  Laterality: Right;   Family History  Problem Relation Age of Onset   Breast cancer Mother        breast CA   Diabetes Father    Hypertension Father    Colon cancer Other    Breast cancer Sister    Breast cancer Sister    Cancer Sister        breast   Cancer Sister        breast   Social History   Socioeconomic History   Marital status: Widowed    Spouse name: Nancie NeasStewart Hockenbury   Number of children: Not on file   Years of education: Not on file   Highest education level: Not on file  Occupational History   Not on file  Tobacco Use   Smoking status: Former    Packs/day: 1.50    Years: 25.00    Pack years: 37.50    Types: Cigarettes    Quit date: 05/09/1980    Years since quitting: 41.3   Smokeless tobacco: Never  Vaping Use    Vaping Use: Never used  Substance and Sexual Activity   Alcohol use: No    Alcohol/week: 0.0 standard drinks   Drug use: No   Sexual activity: Not Currently  Other Topics Concern   Not on file  Social History Narrative   Not on file   Social Determinants of Health   Financial Resource Strain: Medium Risk   Difficulty of Paying Living Expenses: Somewhat hard  Food Insecurity: No Food Insecurity   Worried About Programme researcher, broadcasting/film/videounning Out of Food in the Last Year: Never true   Ran Out of Food in the Last Year: Never true  Transportation Needs: No Transportation Needs   Lack of Transportation (Medical): No   Lack of Transportation (Non-Medical): No  Physical Activity: Inactive   Days of Exercise per Week: 0 days   Minutes of Exercise  per Session: 0 min  Stress: Stress Concern Present   Feeling of Stress : To some extent  Social Connections: Not on file    Tobacco Counseling Counseling given: Not Answered   Clinical Intake:  Pre-visit preparation completed: Yes  Pain : 0-10 Pain Score: 3  Pain Type: Chronic pain Pain Location: Shoulder Pain Orientation: Left Pain Descriptors / Indicators: Dull, Aching Pain Frequency: Constant     Nutritional Status: BMI of 19-24  Normal Nutritional Risks: None Diabetes: No  How often do you need to have someone help you when you read instructions, pamphlets, or other written materials from your doctor or pharmacy?: 1 - Never What is the last grade level you completed in school?: 12th grade  Diabetic? no  Interpreter Needed?: No  Information entered by :: NAllen LPN   Activities of Daily Living In your present state of health, do you have any difficulty performing the following activities: 08/30/2021 08/20/2021  Hearing? Y N  Vision? Y N  Comment blurry at times -  Difficulty concentrating or making decisions? N N  Walking or climbing stairs? Y N  Dressing or bathing? N N  Doing errands, shopping? Y N  Comment does not drive Economist and eating ? N -  Using the Toilet? N -  In the past six months, have you accidently leaked urine? N -  Do you have problems with loss of bowel control? N -  Managing your Medications? N -  Managing your Finances? N -  Housekeeping or managing your Housekeeping? N -  Some recent data might be hidden    Patient Care Team: [place], Betty G, MD as PCP - General (Family Medicine) [place], Betty G, MD as Referring Physician (Family Medicine)  Indicate any recent Medical Services you may have received from other than [facility] providers in the past year (date may be approximate).     Assessment:   This is a routine wellness examination for Brandelyn.  Hearing/Vision screen Vision Screening - Comments:: No regular eye exams,  Dietary issues and exercise activities discussed: Current Exercise Habits: The patient does not participate in regular exercise at present   Goals Addressed             This Visit's Progress    Patient Stated       08/30/2021, no goal       Depression Screen PHQ 2/9 Scores 08/30/2021 06/23/2020 06/02/2016  PHQ - 2 Score 1 0 0    Fall Risk Fall Risk  08/30/2021 03/05/2021 06/23/2020 07/10/2019 07/02/2018  Falls in the past year? 1 1 1  0 1  Comment tripped over tree root, banged in to - - Emmi Telephone Survey: data to providers prior to load [employer] Survey: data to providers prior to load  Number falls in past yr: 1 1 1  - 1  Comment - - - - Emmi Telephone Survey Actual Response = 1  Injury with Fall? 1 0 1 - 1  Risk for fall due to : Impaired balance/gait;Medication side effect;History of fall(s) Impaired balance/gait;History of fall(s) Impaired vision;Impaired balance/gait - -  Follow up Falls evaluation completed;Education provided;Falls prevention discussed Education provided Falls prevention discussed - -    FALL RISK PREVENTION PERTAINING TO THE HOME:  Any stairs in or around the home? Yes  If so, are there any without  handrails? Yes  Home free of loose throw rugs in walkways, pet beds, electrical cords, etc? Yes  Adequate lighting in your home  to reduce risk of falls? Yes   ASSISTIVE DEVICES UTILIZED TO PREVENT FALLS:  Life alert? No  Use of a cane, walker or w/c? Yes  Grab bars in the bathroom? Yes  Shower chair or bench in shower? Yes  Elevated toilet seat or a handicapped toilet? No   TIMED UP AND GO:  Was the test performed? No .      Cognitive Function: MMSE - Mini Mental State Exam 06/02/2016  Not completed: (No Data)     6CIT Screen 08/30/2021 06/23/2020  What Year? 0 points -  What month? 0 points -  What time? 0 points (No Data)  Count back from 20 0 points -  Months in reverse 0 points -  Repeat phrase 0 points -  Total Score 0 -    Immunizations Immunization History  Administered Date(s) Administered   Influenza, High Dose Seasonal PF 06/09/2017, 05/03/2018, 06/30/2019   Influenza-Unspecified 07/18/2016, 06/30/2019   Pneumococcal Conjugate-13 10/22/2015   Pneumococcal Polysaccharide-23 11/28/2006, 01/25/2017   Td 02/12/2014   Tdap 11/28/2006   Zoster, Live 08/15/2013    TDAP status: Up to date  Flu Vaccine status: Declined, Education has been provided regarding the importance of this vaccine but patient still declined. Advised may receive this vaccine at local pharmacy or Health Dept. Aware to provide a copy of the vaccination record if obtained from local pharmacy or Health Dept. Verbalized acceptance and understanding.  Pneumococcal vaccine status: Up to date  Covid-19 vaccine status: Declined, Education has been provided regarding the importance of this vaccine but patient still declined. Advised may receive this vaccine at local pharmacy or Health Dept.or vaccine clinic. Aware to provide a copy of the vaccination record if obtained from local pharmacy or Health Dept. Verbalized acceptance and understanding.  Qualifies for Shingles Vaccine? Yes   Zostavax  completed Yes   Shingrix Completed?: No.    Education has been provided regarding the importance of this vaccine. Patient has been advised to call insurance company to determine out of pocket expense if they have not yet received this vaccine. Advised may also receive vaccine at local pharmacy or Health Dept. Verbalized acceptance and understanding.  Screening Tests Health Maintenance  Topic Date Due   MAMMOGRAM  10/06/2020   INFLUENZA VACCINE  11/12/2021 (Originally 03/15/2021)   Zoster Vaccines- Shingrix (1 of 2) 11/28/2021 (Originally 05/07/1995)   TETANUS/TDAP  02/13/2024   Pneumonia Vaccine 16+ Years old  Completed   DEXA SCAN  Completed   Hepatitis C Screening  Completed   HPV VACCINES  Aged Out   COLONOSCOPY (Pts 45-14yrs Insurance coverage will need to be confirmed)  Discontinued   COVID-19 Vaccine  Discontinued    Health Maintenance  Health Maintenance Due  Topic Date Due   MAMMOGRAM  10/06/2020    Colorectal cancer screening: No longer required.   Mammogram status: No longer required due to age.  Bone Density status: Completed 06/15/2011.   Lung Cancer Screening: (Low Dose CT Chest recommended if Age 84-80 years, 30 pack-year currently smoking OR have quit w/in 15years.) does not qualify.   Lung Cancer Screening Referral: no  Additional Screening:  Hepatitis C Screening: does qualify; Completed 03/21/2017  Vision Screening: Recommended annual ophthalmology exams for early detection of glaucoma and other disorders of the eye. Is the patient up to date with their annual eye exam?  No  Who is the provider or what is the name of the office in which the patient attends annual eye exams? none If pt  is not established with a provider, would they like to be referred to a provider to establish care? No .   Dental Screening: Recommended annual dental exams for proper oral hygiene  Community Resource Referral / Chronic Care Management: CRR required this visit?  No   CCM  required this visit?  No      Plan:     I have personally reviewed and noted the following in the patients chart:   Medical and social history Use of alcohol, tobacco or illicit drugs  Current medications and supplements including opioid prescriptions.  Functional ability and status Nutritional status Physical activity Advanced directives List of other physicians Hospitalizations, surgeries, and ER visits in previous 12 months Vitals Screenings to include cognitive, depression, and falls Referrals and appointments  In addition, I have reviewed and discussed with patient certain preventive protocols, quality metrics, and best practice recommendations. A written personalized care plan for preventive services as well as general preventive health recommendations were provided to patient.     Barb Merinoickeah E Darrel Baroni, LPN   [id number]/11911/16/2023   Nurse Notes: none

## 2021-08-30 NOTE — Patient Instructions (Signed)
Megan Glover , Thank you for taking time to come for your Medicare Wellness Visit. I appreciate your ongoing commitment to your health goals. Please review the following plan we discussed and let me know if I can assist you in the future.   Screening recommendations/referrals: Colonoscopy: not required Mammogram: not required Bone Density: completed 06/15/2011 Recommended yearly ophthalmology/optometry visit for glaucoma screening and checkup Recommended yearly dental visit for hygiene and checkup  Vaccinations: Influenza vaccine: decline Pneumococcal vaccine: completed 01/25/2017 Tdap vaccine: completed 02/12/2014, due 02/13/2024 Shingles vaccine: discussed   Covid-19: decline  Advanced directives: Please bring a copy of your POA (Power of Attorney) and/or Living Will to your next appointment.   Conditions/risks identified: none  Next appointment: Follow up in one year for your annual wellness visit    Preventive Care 65 Years and Older, Female Preventive care refers to lifestyle choices and visits with your health care provider that can promote health and wellness. What does preventive care include? A yearly physical exam. This is also called an annual well check. Dental exams once or twice a year. Routine eye exams. Ask your health care provider how often you should have your eyes checked. Personal lifestyle choices, including: Daily care of your teeth and gums. Regular physical activity. Eating a healthy diet. Avoiding tobacco and drug use. Limiting alcohol use. Practicing safe sex. Taking low-dose aspirin every day. Taking vitamin and mineral supplements as recommended by your health care provider. What happens during an annual well check? The services and screenings done by your health care provider during your annual well check will depend on your age, overall health, lifestyle risk factors, and family history of disease. Counseling  Your health care provider may ask you  questions about your: Alcohol use. Tobacco use. Drug use. Emotional well-being. Home and relationship well-being. Sexual activity. Eating habits. History of falls. Memory and ability to understand (cognition). Work and work Statistician. Reproductive health. Screening  You may have the following tests or measurements: Height, weight, and BMI. Blood pressure. Lipid and cholesterol levels. These may be checked every 5 years, or more frequently if you are over 87 years old. Skin check. Lung cancer screening. You may have this screening every year starting at age 59 if you have a 30-pack-year history of smoking and currently smoke or have quit within the past 15 years. Fecal occult blood test (FOBT) of the stool. You may have this test every year starting at age 13. Flexible sigmoidoscopy or colonoscopy. You may have a sigmoidoscopy every 5 years or a colonoscopy every 10 years starting at age 84. Hepatitis C blood test. Hepatitis B blood test. Sexually transmitted disease (STD) testing. Diabetes screening. This is done by checking your blood sugar (glucose) after you have not eaten for a while (fasting). You may have this done every 1-3 years. Bone density scan. This is done to screen for osteoporosis. You may have this done starting at age 31. Mammogram. This may be done every 1-2 years. Talk to your health care provider about how often you should have regular mammograms. Talk with your health care provider about your test results, treatment options, and if necessary, the need for more tests. Vaccines  Your health care provider may recommend certain vaccines, such as: Influenza vaccine. This is recommended every year. Tetanus, diphtheria, and acellular pertussis (Tdap, Td) vaccine. You may need a Td booster every 10 years. Zoster vaccine. You may need this after age 21. Pneumococcal 13-valent conjugate (PCV13) vaccine. One dose is recommended after  age 40. Pneumococcal polysaccharide  (PPSV23) vaccine. One dose is recommended after age 37. Talk to your health care provider about which screenings and vaccines you need and how often you need them. This information is not intended to replace advice given to you by your health care provider. Make sure you discuss any questions you have with your health care provider. Document Released: 08/28/2015 Document Revised: 04/20/2016 Document Reviewed: 06/02/2015 Elsevier Interactive Patient Education  2017 [place] Prevention in the Home Falls can cause injuries. They can happen to people of all ages. There are many things you can do to make your home safe and to help prevent falls. What can I do on the outside of my home? Regularly fix the edges of walkways and driveways and fix any cracks. Remove anything that might make you trip as you walk through a door, such as a raised step or threshold. Trim any bushes or trees on the path to your home. Use bright outdoor lighting. Clear any walking paths of anything that might make someone trip, such as rocks or tools. Regularly check to see if handrails are loose or broken. Make sure that both sides of any steps have handrails. Any raised decks and porches should have guardrails on the edges. Have any leaves, snow, or ice cleared regularly. Use sand or salt on walking paths during winter. Clean up any spills in your garage right away. This includes oil or grease spills. What can I do in the bathroom? Use night lights. Install grab bars by the toilet and in the tub and shower. Do not use towel bars as grab bars. Use non-skid mats or decals in the tub or shower. If you need to sit down in the shower, use a plastic, non-slip stool. Keep the floor dry. Clean up any water that spills on the floor as soon as it happens. Remove soap buildup in the tub or shower regularly. Attach bath mats securely with double-sided non-slip rug tape. Do not have throw rugs and other things on the  floor that can make you trip. What can I do in the bedroom? Use night lights. Make sure that you have a light by your bed that is easy to reach. Do not use any sheets or blankets that are too big for your bed. They should not hang down onto the floor. Have a firm chair that has side arms. You can use this for support while you get dressed. Do not have throw rugs and other things on the floor that can make you trip. What can I do in the kitchen? Clean up any spills right away. Avoid walking on wet floors. Keep items that you use a lot in easy-to-reach places. If you need to reach something above you, use a strong step stool that has a grab bar. Keep electrical cords out of the way. Do not use floor polish or wax that makes floors slippery. If you must use wax, use non-skid floor wax. Do not have throw rugs and other things on the floor that can make you trip. What can I do with my stairs? Do not leave any items on the stairs. Make sure that there are handrails on both sides of the stairs and use them. Fix handrails that are broken or loose. Make sure that handrails are as long as the stairways. Check any carpeting to make sure that it is firmly attached to the stairs. Fix any carpet that is loose or worn. Avoid having throw rugs  at the top or bottom of the stairs. If you do have throw rugs, attach them to the floor with carpet tape. Make sure that you have a light switch at the top of the stairs and the bottom of the stairs. If you do not have them, ask someone to add them for you. What else can I do to help prevent falls? Wear shoes that: Do not have high heels. Have rubber bottoms. Are comfortable and fit you well. Are closed at the toe. Do not wear sandals. If you use a stepladder: Make sure that it is fully opened. Do not climb a closed stepladder. Make sure that both sides of the stepladder are locked into place. Ask someone to hold it for you, if possible. Clearly mark and make  sure that you can see: Any grab bars or handrails. First and last steps. Where the edge of each step is. Use tools that help you move around (mobility aids) if they are needed. These include: Canes. Walkers. Scooters. Crutches. Turn on the lights when you go into a dark area. Replace any light bulbs as soon as they burn out. Set up your furniture so you have a clear path. Avoid moving your furniture around. If any of your floors are uneven, fix them. If there are any pets around you, be aware of where they are. Review your medicines with your doctor. Some medicines can make you feel dizzy. This can increase your chance of falling. Ask your doctor what other things that you can do to help prevent falls. This information is not intended to replace advice given to you by your health care provider. Make sure you discuss any questions you have with your health care provider. Document Released: 05/28/2009 Document Revised: 01/07/2016 Document Reviewed: 09/05/2014 Elsevier Interactive Patient Education  2017 [employer].

## 2021-08-30 NOTE — Addendum Note (Signed)
Addended by: Kellie Simmering on: 08/30/2021 03:39 PM   Modules accepted: Orders

## 2021-09-01 ENCOUNTER — Telehealth: Payer: Self-pay | Admitting: Family Medicine

## 2021-09-01 NOTE — Telephone Encounter (Signed)
Pt was seen on 08-20-2021 for hair loss and would like to go back on generic bystolic . Pt still have generic bystolic at home and would like to know to if its ok to resume. Pt does not remember whether she suppose to be taking ? Carvedilol  or ? Hydralazine and would like nurse to return her call

## 2021-09-02 ENCOUNTER — Telehealth: Payer: Self-pay

## 2021-09-02 NOTE — Telephone Encounter (Signed)
° °  Telephone encounter was:  Successful.  09/02/2021 Name: Megan Glover MRN: [id number] DOB: 06/02/1946  Megan Glover is a 76 y.o. year old female who is a primary care patient of [place], Timoteo Expose, MD . The community resource team was consulted for assistance with Financial strain and food insecurities Care guide performed the following interventions: Patient provided with information about care guide support team and interviewed to confirm resource needs.Patient stated she really does not need transportation but is having Food insecurities and financial strain. Asked me to mail resources to her home  Follow Up Plan:  Care guide will follow up with patient by phone over the next week to female sure she received resources in the mail    [facility] Guide, Embedded Care Coordination [facility], Care Management  [phone] [address] [place], [place], [place] [address] Phone: [phone] Email: [email or web address]

## 2021-09-02 NOTE — Telephone Encounter (Signed)
° °  Telephone encounter was:  Successful.  09/02/2021 Name: Megan Glover MRN: [id number] DOB: 10/08/1945  Megan Glover is a 76 y.o. year old female who is a primary care patient of [place], Timoteo Expose, MD . The community resource team was consulted for assistance with Financial Difficulties related to food and Utilities  Care guide performed the following interventions:  mailing information to patient .  Follow Up Plan:  No further follow up planned at this time. The patient has been provided with needed resources.    Lenard Forth Care Guide, Embedded Care Coordination [facility], Care Management  [phone] [address] [place], [place], [place] [address] Phone: [phone] Email: [email or web address]

## 2021-09-02 NOTE — Telephone Encounter (Signed)
° °  Telephone encounter was:  Successful.  09/02/2021 Name: Megan Glover MRN: [id number] DOB: 1945/11/23  [name] is a 76 y.o. year old female who is a primary care patient of [place], Timoteo Expose, MD . The community resource team was consulted for assistance with Financial Difficulties related to Food and Utilities  Care guide performed the following interventions: Patient provided with information about care guide support team and interviewed to confirm resource needs. Mailing all resources to her as requested   Follow Up Plan:  No further follow up planned at this time. The patient has been provided with needed resources.    Lenard Forth Care Guide, Embedded Care Coordination [facility], Care Management  [phone] [address] [place], [place], [place] [address] Phone: [phone] Email: [email or web address]

## 2021-09-03 ENCOUNTER — Telehealth (INDEPENDENT_AMBULATORY_CARE_PROVIDER_SITE_OTHER): Payer: Medicare Other | Admitting: Family Medicine

## 2021-09-03 ENCOUNTER — Encounter: Payer: Self-pay | Admitting: Family Medicine

## 2021-09-03 VITALS — Ht 67.0 in

## 2021-09-03 DIAGNOSIS — I10 Essential (primary) hypertension: Secondary | ICD-10-CM | POA: Diagnosis not present

## 2021-09-03 NOTE — Progress Notes (Signed)
Virtual Visit via Telephone Note I connected with Megan Glover on 09/03/21 at  4:30 PM EST by telephone and verified that I am speaking with the correct person using two identifiers.   I discussed the limitations, risks, security and privacy concerns of performing an evaluation and management service by telephone and the availability of in person appointments. I also discussed with the patient that there may be a patient responsible charge related to this service. The patient expressed understanding and agreed to proceed.  Location patient: home Location provider: work office Participants present for the call: patient, provider Patient did not have a visit in the prior 7 days to address this/these issue(s).  Chief Complaint  Patient presents with   Medication Management   History of Present Illness: Megan Glover is a 76 y.o.female with hx of hypertension, anxiety, fibromyalgia, peripheral neuropathy, PAD who wants to discuss her antihypertensive medications. HTN on Losartan 100 mg daily and Hydralazine 10 mg 2 tab tid. She was last seen on 08/20/21, at that time we discontinued Bystolic because she thinks it is causing her loss. She has not noted major changes in hair growth , so would like to resume Bystolic. She wonders if she should be on Carvedilol. On 07/02/21 she had same concern about Bystolic causing hair loss, at that time she wanted to change to Metoprolol but called a few days latter expressing concerns about possible side effects of Metoprolol and wanting to resume Bystolic 20 mg 1/2 tab bid. She does not check BP at home because it exacerbates anxiety but PT has checked it since her last visit and it seemed to be "good." She denies any side effects with current regimen.  Negative for severe/frequent headache, visual changes, chest pain, dyspnea, palpitation,focal neurologic deficit, or edema.  Observations/Objective: Patient sounds cheerful and well on the  phone. I do not appreciate any SOB. Speech and thought processing are grossly intact.+ Anxious. Patient reported vitals:Ht 5\' 7"  (1.702 m)    BMI 18.79 kg/m   Assessment and Plan:  Essential hypertension I am not sure if Bystolic is contributing to her hair loss but it seems like current regimen has been well tolerated and controlling BP. Explained that BB in general have similar side effects profile. I recommend continuing hydralazine 10 mg 2 tablets (20 mg) 3 times daily and Losartan 100 mg daily. Hydralazine can be changed to 25 mg tab when she run out of 10 mg tabs to continue 1 tab tid. Low-salt diet. PT is checking BP. Instructed about warning signs. We will keep next follow-up appointment.  Follow Up Instructions:  Return if symptoms worsen or fail to improve, for Keep next f/u appt in 11/2021. Due to transportation issues she cannot come to the office sooner..  I did not refer this patient for an OV in the next 24 hours for this/these issue(s).  I discussed the assessment and treatment plan with the patient. The patient was provided an opportunity to ask questions and all were answered. The patient agreed with the plan and demonstrated an understanding of the instructions.   The patient was advised to call back or seek an in-person evaluation if the symptoms worsen or if the condition fails to improve as anticipated.  I provided  9 minutes of non-face-to-face time during this encounter.  Chasyn Cinque G. 12/2021, MD  [facility]. [place] office.

## 2021-09-03 NOTE — Assessment & Plan Note (Addendum)
I am not sure if Bystolic is contributing to her hair loss but it seems like current regimen has been well tolerated and controlling BP. Explained that BB in general have similar side effects profile. I recommend continuing hydralazine 10 mg 2 tablets (20 mg) 3 times daily and Losartan 100 mg daily. Hydralazine can be changed to 25 mg tab when she run out of 10 mg tabs to continue 1 tab tid. Low-salt diet. PT is checking BP. Instructed about warning signs. We will keep next follow-up appointment.

## 2021-09-03 NOTE — Telephone Encounter (Signed)
Scheduled pt for today at 4:30 to discuss.

## 2021-10-11 ENCOUNTER — Other Ambulatory Visit: Payer: Self-pay | Admitting: Family Medicine

## 2021-10-11 DIAGNOSIS — R42 Dizziness and giddiness: Secondary | ICD-10-CM

## 2021-10-20 ENCOUNTER — Other Ambulatory Visit: Payer: Self-pay | Admitting: Family Medicine

## 2021-10-20 DIAGNOSIS — F411 Generalized anxiety disorder: Secondary | ICD-10-CM

## 2021-10-20 NOTE — Telephone Encounter (Signed)
-   last refill 09/22/21 ?

## 2021-11-07 ENCOUNTER — Other Ambulatory Visit: Payer: Self-pay | Admitting: Family Medicine

## 2021-11-07 DIAGNOSIS — I10 Essential (primary) hypertension: Secondary | ICD-10-CM

## 2021-11-29 ENCOUNTER — Other Ambulatory Visit: Payer: Self-pay | Admitting: Family Medicine

## 2021-11-29 DIAGNOSIS — R42 Dizziness and giddiness: Secondary | ICD-10-CM

## 2021-12-02 ENCOUNTER — Ambulatory Visit (INDEPENDENT_AMBULATORY_CARE_PROVIDER_SITE_OTHER): Payer: Medicare Other | Admitting: Vascular Surgery

## 2021-12-02 ENCOUNTER — Ambulatory Visit (HOSPITAL_COMMUNITY)
Admission: RE | Admit: 2021-12-02 | Discharge: 2021-12-02 | Disposition: A | Discharge status: Home / Self Care | Payer: Medicare Other | Source: Ambulatory Visit | Attending: Vascular Surgery | Admitting: Vascular Surgery

## 2021-12-02 ENCOUNTER — Encounter: Payer: Self-pay | Admitting: Vascular Surgery

## 2021-12-02 ENCOUNTER — Ambulatory Visit (INDEPENDENT_AMBULATORY_CARE_PROVIDER_SITE_OTHER)
Admission: RE | Admit: 2021-12-02 | Discharge: 2021-12-02 | Disposition: A | Discharge status: Home / Self Care | Payer: Medicare Other | Source: Ambulatory Visit | Attending: Vascular Surgery | Admitting: Vascular Surgery

## 2021-12-02 VITALS — BP 124/71 | HR 56 | Temp 98.2°F | Resp 20 | Ht 67.0 in | Wt 113.0 lb

## 2021-12-02 DIAGNOSIS — I739 Peripheral vascular disease, unspecified: Secondary | ICD-10-CM | POA: Diagnosis not present

## 2021-12-02 DIAGNOSIS — I7409 Other arterial embolism and thrombosis of abdominal aorta: Secondary | ICD-10-CM

## 2021-12-02 NOTE — Progress Notes (Signed)
? ? ?REASON FOR VISIT:  ? ?Follow-up of aortoiliac occlusive disease. ? ?MEDICAL ISSUES:  ? 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  However her symptoms are stable and at this point she has stable claudication without rest pain or critical limb ischemia.  She is 76 years old.  I think a lot of her symptoms could be attributed to other issues such as her degenerative disc disease of her back and her fibromyalgia.  In addition based on her previous CT scan her aorta and iliac arteries are markedly calcified.  Thus any intervention would be associated with increased risk.  Regardless her symptoms are stable so I encouraged her to stay as active as possible.  We also discussed the importance of nutrition.  Fortunately she is not a smoker.  I will stretch her follow-up out to 1 year.  I have ordered follow-up ABIs in 1 year and I will see her back at that time.  She knows to call sooner if she has problems. ? ? ? ?HPI:  ? ?Megan Glover is a pleasant 76 y.o. female who I saw in consultation on 06/03/2021.  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  When I saw her her main complaint was paresthesias in both feet with ambulation, standing, and even at rest.  The patient does have a history of fibromyalgia and peripheral neuropathy.  She also has a history of degenerative disc disease of her back.  Based on her exam I felt that she did have evidence of aortoiliac occlusive disease.  However I felt that her symptoms were multifactorial.  I felt that many of her issues could be contributing to her leg pain.  We discussed arteriography however ultimately we decided to follow this for now and only consider arteriography if her symptoms progress.  She comes in for a 3-month follow-up visit. ? ?Since I  saw her last, she does continue to have claudication in both legs.  This is limited to the calf and is more significant on the right side.  Her activity is fairly limited and she walks with a walker.  Her driveway is on a hill but she is able to walk up and down her driveway with her walker.  I do not get any history of rest pain.  She gets some paresthesias in her feet about 3 times a week.  She denies any history of nonhealing wounds.  She has had been having problems with dizziness lately. ? ?She quit smoking in 1981. ? ?Past Medical History:  ?Diagnosis Date  ? Anemia   ? Anxiety   ? Has tried Celexa and effexor, poor tolerance.  ? Cataract   ? bilateral -left > right  ? Clostridium difficile diarrhea   ? 05-09-14 denies any problems at this time  ? COPD (chronic obstructive pulmonary disease) (HCC)   ? Depression   ? Fibromyalgia   ? History of diverticulosis   ? Hyperlipidemia   ? Statins aggravated myalgias:Pravastatin and Atorvastatin among some.  ? Hypertension   ? Hypothyroidism   ? IBS (irritable bowel syndrome)   ? Neuromuscular disorder (HCC)   ? fibromyalgia  ? Thyroid disease   ? ? ?Family History  ?Problem Relation Age of Onset  ? Breast cancer Mother   ?     breast  CA  ? Diabetes Father   ? Hypertension Father   ? Colon cancer Other   ? Breast cancer Sister   ? Breast cancer Sister   ? Cancer Sister   ?     breast  ? Cancer Sister   ?     breast  ? ? ?SOCIAL HISTORY: ?Social History  ? ?Tobacco Use  ? Smoking status: Former  ?  Packs/day: 1.50  ?  Years: 25.00  ?  Pack years: 37.50  ?  Types: Cigarettes  ?  Quit date: 05/09/1980  ?  Years since quitting: 41.5  ? Smokeless tobacco: Never  ?Substance Use Topics  ? Alcohol use: No  ?  Alcohol/week: 0.0 standard drinks  ? ? ?Allergies  ?Allergen Reactions  ? Sulfa Antibiotics Other (See Comments) and Itching  ?  Anal itching ?"anal itching"  ? Demerol  [Meperidine Hcl] Nausea And Vomiting  ? Levofloxacin In D5w Other (See Comments)  ?  Yeast infections.   ?"Yeast infections"  ? Spironolactone Other (See Comments)  ?  Weakness/malaise ?"Weakness" ?Other reaction(s): Other (See Comments) ?Weakness/malaise  ? Clindamycin   ?  Other reaction(s): Other (See Comments)  ? Clindamycin/Lincomycin   ?  CDIF  ? Erythromycin Diarrhea  ? Erythromycin Base   ?  OTHER REACTION(S): Diarrhea ?Other reaction(s): Other (See Comments) ?OTHER REACTION(S): Diarrhea  ? Fluconazole Other (See Comments)  ? Hydrochlorothiazide Other (See Comments)  ?  Weakness/malaise ?Other reaction(s): Other (See Comments) ?Weakness/malaise  ? Lincomycin Diarrhea  ?  CDIF  ? Meperidine Nausea And Vomiting, Other (See Comments) and Hypertension  ?  Severe headache ?OTHER REACTION(S): Hypertension ?Severe headache  ? Ciprofloxacin Diarrhea  ? Levofloxacin Nausea Only  ?  Yeast infection ?OTHER REACTION(S): Diarrhea ?Other reaction(s): Other (See Comments) ?Yeast infection  ? Metronidazole Nausea Only  ?  OTHER REACTION(S): Diarrhea ?OTHER REACTION(S): Diarrhea ?  ? Other Itching  ? ? ?Current Outpatient Medications  ?Medication Sig Dispense Refill  ? acetaminophen (TYLENOL) 500 MG tablet Take 1,000 mg by mouth every 8 (eight) hours as needed for moderate pain.    ? albuterol (PROVENTIL) (2.5 MG/3ML) 0.083% nebulizer solution Take 3 mLs (2.5 mg total) by nebulization every 6 (six) hours as needed for wheezing or shortness of breath. 150 mL 2  ? albuterol (VENTOLIN HFA) 108 (90 Base) MCG/ACT inhaler INHALE 2 PUFFS INTO THE LUNGS EVERY 4 HOURS AS NEEDED 8.5 g 1  ? aspirin 81 MG chewable tablet Chew 81 mg by mouth.    ? Biotin 1000 MCG tablet Take by mouth.    ? Cholecalciferol (VITAMIN D3) 2000 units TABS Take 1 tablet by mouth daily.    ? clonazePAM (KLONOPIN) 0.5 MG tablet TAKE 1 TABLET(0.5 MG) BY MOUTH TWICE DAILY AS NEEDED FOR ANXIETY 60 tablet 3  ? hydrALAZINE (APRESOLINE) 10 MG tablet TAKE 1 TABLET BY MOUTH THREE TIMES DAILY 270 tablet 1  ? levothyroxine (SYNTHROID) 50 MCG tablet TAKE 1 TABLET(50 MCG) BY  MOUTH DAILY BEFORE BREAKFAST 90 tablet 3  ? lidocaine (XYLOCAINE) 5 % ointment Apply 1 application topically as needed. 50 g 2  ? losartan (COZAAR) 100 MG tablet TAKE 1 TABLET(100 MG) BY MOUTH EVERY MORNING 90 tablet 3  ? meclizine (ANTIVERT) 25 MG tablet TAKE 1 TABLET(25 MG) BY MOUTH DAILY AS NEEDED FOR DIZZINESS 30 tablet 0  ? OVER THE COUNTER MEDICATION Apply 2 drops to eye at bedtime as needed (for itchy dry eyes.).    ? PROCTOZONE-HC 2.5 % rectal  cream USE RECTALLY EVERY DAY AS NEEDED 30 g 1  ? rosuvastatin (CRESTOR) 10 MG tablet Take 1 tablet (10 mg total) by mouth daily. 30 tablet 3  ? tiZANidine (ZANAFLEX) 4 MG tablet TAKE 1 TABLET(4 MG) BY MOUTH EVERY 12 HOURS AS NEEDED FOR MUSCLE SPASMS 30 tablet 0  ? ?No current facility-administered medications for this visit.  ? ? ?REVIEW OF SYSTEMS:  ?  denotes positive finding,  denotes negative finding ?Cardiac  Comments:  ?Chest pain or chest pressure:    ?Shortness of breath upon exertion: x   ?Short of breath when lying flat: x   ?Irregular heart rhythm:    ?    ?Vascular    ?Pain in calf, thigh, or hip brought on by ambulation: x   ?Pain in feet at night that wakes you up from your sleep:  x   ?Blood clot in your veins:    ?Leg swelling:     ?    ?Pulmonary    ?Oxygen at home:    ?Productive cough:  x   ?Wheezing:  x   ?    ?Neurologic    ?Sudden weakness in arms or legs:  x   ?Sudden numbness in arms or legs:  x   ?Sudden onset of difficulty speaking or slurred speech:    ?Temporary loss of vision in one eye:     ?Problems with dizziness:  x   ?    ?Gastrointestinal    ?Blood in stool:  x   ?Vomited blood:     ?    ?Genitourinary    ?Burning when urinating:     ?Blood in urine:    ?    ?Psychiatric    ?Major depression:     ?    ?Hematologic    ?Bleeding problems:    ?Problems with blood clotting too easily:    ?    ?Skin    ?Rashes or ulcers:    ?    ?Constitutional    ?Fever or chills:    ? ?PHYSICAL EXAM:  ? ?There were no vitals filed for this  visit. ? ?GENERAL: The patient is a well-nourished female, in no acute distress. The vital signs are documented above. ?CARDIAC: There is a regular rate and rhythm.  ?VASCULAR: I do not detect carotid bruits.

## 2022-01-07 ENCOUNTER — Other Ambulatory Visit: Payer: Self-pay | Admitting: Family Medicine

## 2022-01-07 DIAGNOSIS — R42 Dizziness and giddiness: Secondary | ICD-10-CM

## 2022-01-18 ENCOUNTER — Other Ambulatory Visit: Payer: Self-pay | Admitting: Family Medicine

## 2022-03-02 ENCOUNTER — Other Ambulatory Visit: Payer: Self-pay | Admitting: Family Medicine

## 2022-03-02 DIAGNOSIS — F411 Generalized anxiety disorder: Secondary | ICD-10-CM

## 2022-03-04 NOTE — Telephone Encounter (Signed)
-   last filled 01/30/22 - last OV 09/03/21

## 2022-04-05 ENCOUNTER — Other Ambulatory Visit: Payer: Self-pay | Admitting: Family Medicine

## 2022-04-05 DIAGNOSIS — R42 Dizziness and giddiness: Secondary | ICD-10-CM

## 2022-04-05 DIAGNOSIS — F411 Generalized anxiety disorder: Secondary | ICD-10-CM

## 2022-04-05 NOTE — Telephone Encounter (Signed)
Last filled  03/04/22

## 2022-04-07 NOTE — Telephone Encounter (Signed)
She needs a follow up appt. Thanks, BJ

## 2022-04-07 NOTE — Telephone Encounter (Signed)
Pt called to say the Pharmacy is still telling her they have not received a prescription for the following:  clonazePAM (KLONOPIN) 0.5 MG tablet   Started on 03/04/2022  meclizine (ANTIVERT) 25 MG tablet Started on 01/07/2022  Pt is requesting refills as soon as possible.  LOV:  07/02/2021 LVV:  09/03/2021  Does MD wish for Pt to be scheduled for an OV or VV first?  Please advise.  [id number] - [place], [place] - [address] Phone:  [phone]  Fax:  [phone]

## 2022-04-08 ENCOUNTER — Other Ambulatory Visit: Payer: Self-pay | Admitting: Family Medicine

## 2022-04-08 DIAGNOSIS — R42 Dizziness and giddiness: Secondary | ICD-10-CM

## 2022-04-08 DIAGNOSIS — F411 Generalized anxiety disorder: Secondary | ICD-10-CM

## 2022-04-08 NOTE — Telephone Encounter (Signed)
Pt has virtual on 04-12-2022

## 2022-04-11 ENCOUNTER — Other Ambulatory Visit: Payer: Self-pay | Admitting: Family Medicine

## 2022-04-11 DIAGNOSIS — F411 Generalized anxiety disorder: Secondary | ICD-10-CM

## 2022-04-11 MED ORDER — CLONAZEPAM 0.5 MG PO TABS: ORAL_TABLET | ORAL | 3 refills | Status: DC

## 2022-04-11 NOTE — Progress Notes (Signed)
Has appt 04/12/22. Marinell Igarashi [place], MD

## 2022-04-12 ENCOUNTER — Encounter: Payer: Self-pay | Admitting: Family Medicine

## 2022-04-12 ENCOUNTER — Telehealth (INDEPENDENT_AMBULATORY_CARE_PROVIDER_SITE_OTHER): Payer: Self-pay | Admitting: Family Medicine

## 2022-04-12 VITALS — Ht 67.0 in

## 2022-04-12 DIAGNOSIS — F411 Generalized anxiety disorder: Secondary | ICD-10-CM

## 2022-04-12 DIAGNOSIS — I10 Essential (primary) hypertension: Secondary | ICD-10-CM

## 2022-04-12 NOTE — Progress Notes (Signed)
Called x 4 times,last one at 5:21 pm, no answer and voice mail has not been setup yet. Betty [place], MD

## 2022-04-13 ENCOUNTER — Telehealth: Payer: Self-pay | Admitting: Family Medicine

## 2022-04-13 NOTE — Telephone Encounter (Signed)
Pt called to ask if MD could please send her just a couple of pills of the:  clonazePAM (KLONOPIN) 0.5 MG tablet  until her next visit.  Pt requested a virtual visit and has been scheduled for Tuesday, 04/19/22 at 4:30 pm   Please advise.  [id number] - [place], [place] - [address] Phone:  [phone]  Fax:  [phone]

## 2022-04-13 NOTE — Telephone Encounter (Signed)
Rx was sent in by PCP.

## 2022-04-16 ENCOUNTER — Other Ambulatory Visit: Payer: Self-pay | Admitting: Family Medicine

## 2022-04-19 ENCOUNTER — Telehealth (INDEPENDENT_AMBULATORY_CARE_PROVIDER_SITE_OTHER): Payer: Medicare Other | Admitting: Family Medicine

## 2022-04-19 ENCOUNTER — Encounter: Payer: Self-pay | Admitting: Family Medicine

## 2022-04-19 ENCOUNTER — Other Ambulatory Visit: Payer: Self-pay | Admitting: Family Medicine

## 2022-04-19 VITALS — Ht 67.0 in

## 2022-04-19 DIAGNOSIS — R42 Dizziness and giddiness: Secondary | ICD-10-CM | POA: Diagnosis not present

## 2022-04-19 DIAGNOSIS — F411 Generalized anxiety disorder: Secondary | ICD-10-CM | POA: Diagnosis not present

## 2022-04-19 DIAGNOSIS — I10 Essential (primary) hypertension: Secondary | ICD-10-CM

## 2022-04-19 MED ORDER — AMLODIPINE BESYLATE 2.5 MG PO TABS: 2.5000 mg | ORAL_TABLET | Freq: Every day | ORAL | 1 refills | Status: DC

## 2022-04-19 MED ORDER — MECLIZINE HCL 25 MG PO TABS: 12.5000 mg | ORAL_TABLET | Freq: Every day | ORAL | 0 refills | Status: DC | PRN

## 2022-04-19 NOTE — Assessment & Plan Note (Signed)
Problem is overall stable. Continue clonazepam 0.5 mg twice daily as needed. We discussed some side effects.

## 2022-04-19 NOTE — Assessment & Plan Note (Signed)
This problem is chronic and stable. Continue meclizine 25 mg 1/2 to 1 tablet daily as needed.  We discussed side effects of the medication, including falls. Instructed about warning signs.

## 2022-04-19 NOTE — Assessment & Plan Note (Signed)
BP seems to be adequately controlled. In the past she has tried several medications, which has been discontinued due to side effects. She would like to stop hydralazine. She agrees with trying amlodipine 2.5 mg daily at bedtime. Continue losartan 100 mg daily. Continue low-salt diet.

## 2022-04-19 NOTE — Progress Notes (Signed)
Virtual Visit via Telephone Note I connected with Megan Glover on 04/19/22 at  4:30 PM EDT by telephone and verified that I am speaking with the correct person using two identifiers.   I discussed the limitations, risks, security and privacy concerns of performing an evaluation and management service by telephone and the availability of in person appointments. I also discussed with the patient that there may be a patient responsible charge related to this service. The patient expressed understanding and agreed to proceed.  Location patient: home Location provider: work office Participants present for the call: patient,her son, provider Patient did not have a visit in the prior 7 days to address this/these issue(s).  Chief Complaint  Patient presents with   Medication Management   Follow-up   History of Present Illness: Megan Glover is a 76 y.o.female with hx of PAD, hypertension, anxiety, fibromyalgia, and peripheral neuropathy following on some of her chronic medical problems today. Last follow-up on 09/03/2021, video visit. Last office visit on 07/02/2021. She does not want to come to the office because afraid of being in contact with COVID-19 infection.  Since her last visit she has seen vascular and ortho. She has decided to have vascular procedure done, states that it is planned for 07/2022 and she is going to need pre op clearance. Lower extremity numbness and tingling, states that right lower extremity started "bother" her "a little bit." Symptoms mainly at night when she is in bed. She has some pain with walking. Negative for edema, ulcers, or cyanosis. +  Arthralgias, mainly right hip, when turning.  Lab Results  Component Value Date   CHOL 158 03/05/2021   HDL 59.50 03/05/2021   LDLCALC 85 03/05/2021   TRIG 68.0 03/05/2021   CHOLHDL 3 03/05/2021   She has had surgical interventions, mainly ortho related and denies any complication during or after  procedures. She stopped Rosuvastatin 10 mg because it was making her "sick", she does not elaborate. She is still on Aspirin 81 mg daily. She is using a walker when she goes out and a cane when she is at home. She is also reporting weight loss, she is eating smaller portions.  Hypertension: She is currently on losartan 100 mg daily and hydralazine 10 mg 3 times daily. She would like to stop hydralazine, it is causing dry mouth. She has tried different medications in the past, mainly not well-tolerated. She is no longer on Bystolic. She does not check BP (124/71 in 11/2021) at home but BP has been in normal range during office visits. Negative for frequent/severe headache, visual changes, CP, dyspnea, focal neurologic deficit, gross hematuria, decreased urine output, or edema.  Lab Results  Component Value Date   CREATININE 0.75 03/05/2021   BUN 13 03/05/2021   NA 133 (L) 03/05/2021   K 4.7 03/05/2021   CL 97 03/05/2021   CO2 28 03/05/2021   Requesting refills on meclizine.  States that medication helps with episodes of dizziness and nausea, both she has had for many years, since childhood: "Dizzy and jittery", tremor and mild nausea.  Sometimes dizziness is like spinning sensation.  Meclizine helps with all the symptoms. In average she takes meclizine once per week.  Anxiety: Currently she is on clonazepam 0.5 mg twice daily, denies does help her with sleep. She has taking medication for years. Negative for depressed mood.  Observations/Objective: Patient sounds cheerful and well on the phone. I do not appreciate any SOB. Speech and thought processing are grossly intact.  Patient reported vitals:Ht 5\' 7"  (1.702 m)   BMI 17.70 kg/m   Assessment and Plan:  Essential hypertension BP seems to be adequately controlled. In the past she has tried several medications, which has been discontinued due to side effects. She would like to stop hydralazine. She agrees with trying  amlodipine 2.5 mg daily at bedtime. Continue losartan 100 mg daily. Continue low-salt diet.  Generalized anxiety disorder Problem is overall stable. Continue clonazepam 0.5 mg twice daily as needed. We discussed some side effects.  Dizziness This problem is chronic and stable. Continue meclizine 25 mg 1/2 to 1 tablet daily as needed.  We discussed side effects of the medication, including falls. Instructed about warning signs.  In regard to PAD, per pt report she is having vascular procedure in 07/2022.  We discussed CV benefits of statins, she is not interested in resuming rosuvastatin or try a different medication. Continue Aspirin 81 mg daily. Following with vascular. Clearly instructed about warning signs.  Follow Up Instructions:  Return in about 4 months (around 08/19/2022).  I did not refer this patient for an OV in the next 24 hours for this/these issue(s).  I discussed the assessment and treatment plan with the patient. The patient was provided an opportunity to ask questions and all were answered. The patient agreed with the plan and demonstrated an understanding of the instructions.   The patient was advised to call back or seek an in-person evaluation if the symptoms worsen or if the condition fails to improve as anticipated.  I provided  31 minutes of non-face-to-face time during this encounter.  Asma Boldon G. 10/18/2022, MD  [facility]. [place] office.

## 2022-04-20 ENCOUNTER — Telehealth: Payer: Self-pay | Admitting: Family Medicine

## 2022-04-20 NOTE — Telephone Encounter (Signed)
-----   Message from Betty G [place], MD sent at 04/19/2022  6:55 PM EDT ----- 4 months f/u Thanks

## 2022-04-20 NOTE — Telephone Encounter (Signed)
Attempted to call patient but did not get an answer. No voicemail box set up      [employer]

## 2022-04-27 ENCOUNTER — Telehealth: Payer: Self-pay | Admitting: *Deleted

## 2022-04-27 NOTE — Patient Outreach (Signed)
  Care Coordination   04/27/2022 Name: Megan Glover MRN: [id number] DOB: February 27, 1946   Care Coordination Outreach Attempts:  An unsuccessful telephone outreach was attempted today to offer the patient information about available care coordination services as a benefit of their health plan.   Follow Up Plan:  Additional outreach attempts will be made to offer the patient care coordination information and services.   Encounter Outcome:  No Answer  Care Coordination Interventions Activated:  No   Care Coordination Interventions:  No, not indicated    Elliot Cousin, RN Care Management Coordinator [facility] [employer] Main Office [phone]

## 2022-05-04 ENCOUNTER — Other Ambulatory Visit: Payer: Self-pay | Admitting: Family Medicine

## 2022-05-16 ENCOUNTER — Encounter: Payer: Self-pay | Admitting: *Deleted

## 2022-05-16 ENCOUNTER — Other Ambulatory Visit: Payer: Self-pay | Admitting: Family Medicine

## 2022-05-16 ENCOUNTER — Telehealth: Payer: Self-pay | Admitting: *Deleted

## 2022-05-16 MED ORDER — CLONAZEPAM 1 MG PO TABS: 0.5000 mg | ORAL_TABLET | Freq: Two times a day (BID) | ORAL | 0 refills | Status: DC | PRN

## 2022-05-16 NOTE — Telephone Encounter (Signed)
Pt is calling and [id number] - [place], [place] [address] Phone:  [phone]  Fax:  [phone]     does not have clonazePAM (KLONOPIN) 0.5 MG tablet in stock they do have 1 mg. Pt can cut in half. Please advise

## 2022-05-16 NOTE — Telephone Encounter (Signed)
Pt calling in checking progress, states she's "a mess over here"  requests a call when completed [place], [place] - [address] Phone:  [phone]  Fax:  [phone]

## 2022-05-16 NOTE — Telephone Encounter (Signed)
If this is one time problem, I can send a prescription for Clonazepam 1 mg to tan 0.5 tab bid for 30 days and hopefully her pharmacy will have Clonazepam 0.5 mg next time she is due for refills. Thanks, BJ

## 2022-05-16 NOTE — Patient Outreach (Signed)
  Care Coordination   Initial Visit Note   05/16/2022 Name: Megan Glover MRN: [id number] DOB: 1946-02-02  Megan Glover is a 76 y.o. year old female who sees [place], Malka So, MD for primary care. I spoke with  Megan Glover by phone today.  What matters to the patients health and wellness today?  No needs    Goals Addressed               This Visit's Progress     COMPLETED: Medication issues with Clonazepam (pt-stated)        Care Coordination Interventions: Provided education to patient and/or caregiver about advanced directives Collaborated with Dr. Martinique regarding medication Clonazepam Reviewed scheduled/upcoming provider appointments including verification of any pending appointments Screening for signs and symptoms of depression related to chronic disease state  Assessed social determinant of health barriers  Collaboration-Pt's pharmacy unable to obtain the 0.5 mg Clonazepam and pt has requested the 1 mg to cut in half for her ongoing dosage to avoid possible symptoms occurring.         SDOH assessments and interventions completed:  Yes  SDOH Interventions Today    Flowsheet Row Most Recent Value  SDOH Interventions   Food Insecurity Interventions Intervention Not Indicated  Housing Interventions Intervention Not Indicated  Transportation Interventions Intervention Not Indicated  Utilities Interventions Intervention Not Indicated        Care Coordination Interventions Activated:  Yes  Care Coordination Interventions:  Yes, provided   Follow up plan: No further intervention required.   Encounter Outcome:  Pt. Visit Completed   Raina Mina, RN Care Management Coordinator [place] Office [phone]

## 2022-05-16 NOTE — Patient Instructions (Signed)
Visit Information  Thank you for taking time to visit with me today. Please don't hesitate to contact me if I can be of assistance to you.   Following are the goals we discussed today:   Goals Addressed               This Visit's Progress     COMPLETED: Medication issues with Clonazepam (pt-stated)        Care Coordination Interventions: Provided education to patient and/or caregiver about advanced directives Collaborated with Dr. Martinique regarding medication Clonazepam Reviewed scheduled/upcoming provider appointments including verification of any pending appointments Screening for signs and symptoms of depression related to chronic disease state  Assessed social determinant of health barriers  Collaboration-Pt's pharmacy unable to obtain the 0.5 mg Clonazepam and pt has requested the 1 mg to cut in half for her ongoing dosage to avoid possible symptoms occurring.         Please call the care guide team at [phone] if you need to cancel or reschedule your appointment.   If you are experiencing a Mental Health or [place] or need someone to talk to, please call the Suicide and Crisis Lifeline: 988  Patient verbalizes understanding of instructions and care plan provided today and agrees to view in [facility]. Active MyChart status and patient understanding of how to access instructions and care plan via MyChart confirmed with patient.     No further follow up required: No further needs    Raina Mina, RN Care Management Coordinator [place] Office [phone]

## 2022-07-09 ENCOUNTER — Other Ambulatory Visit: Payer: Self-pay | Admitting: Family Medicine

## 2022-08-03 ENCOUNTER — Telehealth: Payer: Self-pay | Admitting: Family Medicine

## 2022-08-03 NOTE — Telephone Encounter (Signed)
Left message for patient to call back and schedule Medicare Annual Wellness Visit (AWV) either virtually or in office. Left  my jabber number [phone]   Last AWV 08/30/21  please schedule with Nurse Health Adviser   45 min for awv-i and in office appointments 30 min for awv-s  phone/virtual appointments  

## 2022-08-11 ENCOUNTER — Emergency Department (HOSPITAL_COMMUNITY)
Admission: EM | Admit: 2022-08-11 | Discharge: 2022-08-12 | Disposition: A | Discharge status: Home / Self Care | Payer: Medicare Other | Attending: Emergency Medicine | Admitting: Emergency Medicine

## 2022-08-11 DIAGNOSIS — Z7982 Long term (current) use of aspirin: Secondary | ICD-10-CM | POA: Insufficient documentation

## 2022-08-11 DIAGNOSIS — E039 Hypothyroidism, unspecified: Secondary | ICD-10-CM | POA: Insufficient documentation

## 2022-08-11 DIAGNOSIS — N3000 Acute cystitis without hematuria: Secondary | ICD-10-CM | POA: Diagnosis not present

## 2022-08-11 DIAGNOSIS — Z79899 Other long term (current) drug therapy: Secondary | ICD-10-CM | POA: Diagnosis not present

## 2022-08-11 DIAGNOSIS — R102 Pelvic and perineal pain: Secondary | ICD-10-CM | POA: Insufficient documentation

## 2022-08-11 DIAGNOSIS — R42 Dizziness and giddiness: Secondary | ICD-10-CM | POA: Diagnosis not present

## 2022-08-11 DIAGNOSIS — J449 Chronic obstructive pulmonary disease, unspecified: Secondary | ICD-10-CM | POA: Diagnosis not present

## 2022-08-11 DIAGNOSIS — R627 Adult failure to thrive: Secondary | ICD-10-CM | POA: Insufficient documentation

## 2022-08-11 DIAGNOSIS — I10 Essential (primary) hypertension: Secondary | ICD-10-CM | POA: Insufficient documentation

## 2022-08-11 DIAGNOSIS — Z7951 Long term (current) use of inhaled steroids: Secondary | ICD-10-CM | POA: Diagnosis not present

## 2022-08-11 DIAGNOSIS — E860 Dehydration: Secondary | ICD-10-CM | POA: Insufficient documentation

## 2022-08-11 LAB — CBC
HCT: 35.3 % — ABNORMAL LOW (ref 36.0–46.0)
Hemoglobin: 12.1 g/dL (ref 12.0–15.0)
MCH: 31.2 pg (ref 26.0–34.0)
MCHC: 34.3 g/dL (ref 30.0–36.0)
MCV: 91 fL (ref 80.0–100.0)
Platelets: 270 10*3/uL (ref 150–400)
RBC: 3.88 MIL/uL (ref 3.87–5.11)
RDW: 12.4 % (ref 11.5–15.5)
WBC: 9 10*3/uL (ref 4.0–10.5)
nRBC: 0 % (ref 0.0–0.2)

## 2022-08-11 LAB — URINALYSIS, ROUTINE W REFLEX MICROSCOPIC
Bilirubin Urine: NEGATIVE
Glucose, UA: NEGATIVE mg/dL
Hgb urine dipstick: NEGATIVE
Ketones, ur: 5 mg/dL — AB
Nitrite: NEGATIVE
Protein, ur: NEGATIVE mg/dL
Specific Gravity, Urine: 1.02 (ref 1.005–1.030)
WBC, UA: >50 WBC/hpf — ABNORMAL HIGH (ref 0–5)
pH: 5 (ref 5.0–8.0)

## 2022-08-11 LAB — BASIC METABOLIC PANEL
Anion gap: 8 (ref 5–15)
BUN: 28 mg/dL — ABNORMAL HIGH (ref 8–23)
CO2: 27 mmol/L (ref 22–32)
Calcium: 9.7 mg/dL (ref 8.9–10.3)
Chloride: 98 mmol/L (ref 98–111)
Creatinine, Ser: 0.65 mg/dL (ref 0.44–1.00)
GFR, Estimated: >60 mL/min (ref >60)
Glucose, Bld: 110 mg/dL — ABNORMAL HIGH (ref 70–99)
Potassium: 4.5 mmol/L (ref 3.5–5.1)
Sodium: 133 mmol/L — ABNORMAL LOW (ref 135–145)

## 2022-08-11 NOTE — ED Triage Notes (Signed)
Pt presents to ED from home C/O vaginal pain, burning and weight loss over about a year and a half.

## 2022-08-12 ENCOUNTER — Emergency Department (HOSPITAL_COMMUNITY): Payer: Medicare Other

## 2022-08-12 DIAGNOSIS — R102 Pelvic and perineal pain: Secondary | ICD-10-CM | POA: Diagnosis not present

## 2022-08-12 MED ORDER — SODIUM CHLORIDE 0.9 % IV BOLUS
1000.0000 mL | Freq: Once | INTRAVENOUS | Status: AC
  Administered 2022-08-12: 1000 mL via INTRAVENOUS

## 2022-08-12 MED ORDER — CEPHALEXIN 500 MG PO CAPS: 500.0000 mg | ORAL_CAPSULE | Freq: Two times a day (BID) | ORAL | 0 refills | Status: DC

## 2022-08-12 MED ORDER — SODIUM CHLORIDE 0.9 % IV SOLN
1.0000 g | Freq: Once | INTRAVENOUS | Status: AC
  Administered 2022-08-12: 1 g via INTRAVENOUS
  Filled 2022-08-12: qty 10

## 2022-08-12 NOTE — ED Notes (Signed)
X-ray at bedside

## 2022-08-12 NOTE — ED Provider Notes (Signed)
[facility] EMERGENCY DEPARTMENT Provider Note   CSN: [id number] Arrival date & time: 08/11/22  1732     History  Chief Complaint  Patient presents with   Pelvic Pain   Failure To Thrive    Megan Glover is a 76 y.o. female.   Pelvic Pain  Patient with history of anxiety, COPD, depression, hypertension presents with multiple complaints.  She is accompanied by her sons.  She has multiple issues going on including recent pelvic pain and dysuria.  She has had weight loss for over a year.  She reports dizziness upon walking for months.  No recent falls.  No chest pain.  No fevers or vomiting.  No new medications that have caused the symptoms.  She uses a walker at home.  She has been constipated, but this is usual for her.  No diarrhea.  There was no acute changes today that brought her in.  Family finally convinced her to be evaluated    Past Medical History:  Diagnosis Date   Anemia    Anxiety    Has tried Celexa and effexor, poor tolerance.   Cataract    bilateral -left > right   Clostridium difficile diarrhea    05-09-14 denies any problems at this time   COPD (chronic obstructive pulmonary disease) (HCC)    Depression    Fibromyalgia    History of diverticulosis    Hyperlipidemia    Statins aggravated myalgias:Pravastatin and Atorvastatin among some.   Hypertension    Hypothyroidism    IBS (irritable bowel syndrome)    Neuromuscular disorder (HCC)    fibromyalgia   Thyroid disease     Home Medications Prior to Admission medications   Medication Sig Start Date End Date Taking? Authorizing Provider  cephALEXin (KEFLEX) 500 MG capsule Take 1 capsule (500 mg total) by mouth 2 (two) times daily. 08/12/22  Yes Zadie Rhine, MD  acetaminophen (TYLENOL) 500 MG tablet Take 1,000 mg by mouth every 8 (eight) hours as needed for moderate pain.    [provider]  albuterol (PROVENTIL) (2.5 MG/3ML) 0.083% nebulizer solution Take 3 mLs (2.5 mg total) by  nebulization every 6 (six) hours as needed for wheezing or shortness of breath. 10/30/18   [place], Betty G, MD  albuterol (VENTOLIN HFA) 108 (90 Base) MCG/ACT inhaler INHALE 2 PUFFS INTO THE LUNGS EVERY 4 HOURS AS NEEDED 05/05/22   [place], Betty G, MD  amLODipine (NORVASC) 2.5 MG tablet TAKE 1 TABLET(2.5 MG) BY MOUTH DAILY 04/20/22   [place], Betty G, MD  aspirin 81 MG chewable tablet Chew 81 mg by mouth.    [provider]  Biotin 1000 MCG tablet Take by mouth.    [provider]  Cholecalciferol (VITAMIN D3) 2000 units TABS Take 1 tablet by mouth daily.    [provider]  clonazePAM (KLONOPIN) 0.5 MG tablet TAKE 1 TABLET(0.5 MG) BY MOUTH TWICE DAILY AS NEEDED FOR ANXIETY 04/11/22   [place], Betty G, MD  clonazePAM (KLONOPIN) 1 MG tablet Take 0.5 tablets (0.5 mg total) by mouth 2 (two) times daily as needed for anxiety. 05/16/22 06/15/22  [place], Betty G, MD  levothyroxine (SYNTHROID) 50 MCG tablet TAKE 1 TABLET(50 MCG) BY MOUTH DAILY BEFORE BREAKFAST 07/11/22   [place], Betty G, MD  lidocaine (XYLOCAINE) 5 % ointment Apply 1 application topically as needed. 03/05/21   [place], Betty G, MD  losartan (COZAAR) 100 MG tablet TAKE 1 TABLET(100 MG) BY MOUTH EVERY MORNING 04/19/22   [place], Betty G,  MD  meclizine (ANTIVERT) 25 MG tablet Take 0.5-1 tablets (12.5-25 mg total) by mouth daily as needed for dizziness. 04/19/22   [place], Betty G, MD  OVER THE COUNTER MEDICATION Apply 2 drops to eye at bedtime as needed (for itchy dry eyes.).    [provider]  PROCTOZONE-HC 2.5 % rectal cream USE RECTALLY EVERY DAY AS NEEDED 12/13/19   [place], Betty G, MD  rosuvastatin (CRESTOR) 10 MG tablet Take 1 tablet (10 mg total) by mouth daily. 07/05/21   [place], Betty G, MD  tiZANidine (ZANAFLEX) 4 MG tablet TAKE 1 TABLET(4 MG) BY MOUTH EVERY 12 HOURS AS NEEDED FOR MUSCLE SPASMS 06/28/21   [place], Betty G, MD      Allergies    Sulfa antibiotics, Demerol  [meperidine hcl], Levofloxacin in d5w,  Spironolactone, Clindamycin, Clindamycin/lincomycin, Erythromycin, Erythromycin base, Fluconazole, Hydrochlorothiazide, Lincomycin, Meperidine, Ciprofloxacin, Levofloxacin, Metronidazole, and Other    Review of Systems   Review of Systems  Genitourinary:  Positive for pelvic pain.    Physical Exam Updated Vital Signs BP (!) 172/83   Pulse 77   Temp 97.9 F (36.6 C) (Oral)   Resp 15   Ht 1.702 m (5\' 7" )   Wt 49.9 kg   SpO2 97%   BMI 17.23 kg/m  Physical Exam CONSTITUTIONAL: Elderly and cachectic HEAD: Normocephalic/atraumatic EYES: EOMI/PERRL ENMT: Mucous membranes moist NECK: supple no meningeal signs SPINE/BACK:entire spine nontender CV: S1/S2 noted, no murmurs/rubs/gallops noted LUNGS: Lungs are clear to auscultation bilaterally, no apparent distress ABDOMEN: soft, nontender GU:no cva tenderness GU exam performed by female chaperone.  No erythema, no bruising.  No vulvar lesions.  No prolapse noted NEURO: Pt is awake/alert/appropriate, moves all extremitiesx4.  No facial droop.  No focal weakness is noted EXTREMITIES: pulses normal/equal, full ROM, no deformities SKIN: warm, color normal PSYCH: Mildly anxious  ED Results / Procedures / Treatments   Labs (all labs ordered are listed, but only abnormal results are displayed) Labs Reviewed  CBC - Abnormal; Notable for the following components:      Result Value   HCT 35.3 (*)    All other components within normal limits  BASIC METABOLIC PANEL - Abnormal; Notable for the following components:   Sodium 133 (*)    Glucose, Bld 110 (*)    BUN 28 (*)    All other components within normal limits  URINALYSIS, ROUTINE W REFLEX MICROSCOPIC - Abnormal; Notable for the following components:   APPearance HAZY (*)    Ketones, ur 5 (*)    Leukocytes,Ua LARGE (*)    WBC, UA >50 (*)    Bacteria, UA RARE (*)    All other components within normal limits    EKG None  Radiology DG Chest Port 1 View  Result Date:  08/12/2022 CLINICAL DATA:  Weakness EXAM: PORTABLE CHEST 1 VIEW COMPARISON:  04/09/2018 FINDINGS: The lungs are hyperinflated with diffuse interstitial prominence. No focal airspace consolidation or pulmonary edema. No pleural effusion or pneumothorax. Normal cardiomediastinal contours. IMPRESSION: 1. No acute cardiopulmonary disease. 2. COPD. Electronically Signed   By: 04/11/2018 M.D.   On: 08/12/2022 02:52    Procedures Procedures    Medications Ordered in ED Medications  sodium chloride 0.9 % bolus 1,000 mL (0 mLs Intravenous Stopped 08/12/22 0253)  cefTRIAXone (ROCEPHIN) 1 g in sodium chloride 0.9 % 100 mL IVPB (0 g Intravenous Stopped 08/12/22 0230)  sodium chloride 0.9 % bolus 1,000 mL (0 mLs Intravenous Stopped 08/12/22 0401)    ED Course/ Medical  Decision Making/ A&P Clinical Course as of 08/12/22 0530  Fri Aug 12, 2022  Vito Berger Theodoro Grist - [phone] [DW]  0528 Patient improved, no acute distress.  She is ambulatory at her baseline.  Most of her issues are chronic and ongoing for several months.  She was found to have a UTI and tolerated IV antibiotics.  She is responded to IV fluids.  Patient would like to be discharged home. [DW]  5732 I discussed this with patient and her son.  They feel comfortable discharge home.  She has PCP follow-up soon.  Will also place home health consult due to her recent deconditioning. [DW]    Clinical Course User Index [DW] Zadie Rhine, MD                           Medical Decision Making Amount and/or Complexity of Data Reviewed Labs: ordered. Radiology: ordered.  Risk Prescription drug management.   This patient presents to the ED for concern of weakness, this involves an extensive number of treatment options, and is a complaint that carries with it a high risk of complications and morbidity.  The differential diagnosis includes but is not limited to CVA, intracranial hemorrhage, acute coronary syndrome, renal failure, urinary  tract infection, electrolyte disturbance, pneumonia    Comorbidities that complicate the patient evaluation: Patient's presentation is complicated by their history of weight loss, hypertension  Social Determinants of Health: Patient's  poor mobility   increases the complexity of managing their presentation  Additional history obtained: Additional history obtained from family Records reviewed Primary Care Documents  Lab Tests: I Ordered, and personally interpreted labs.  The pertinent results include: Dehydration and UTI  Imaging Studies ordered: I ordered imaging studies including X-ray chest   I independently visualized and interpreted imaging which showed no acute findings I agree with the radiologist interpretation  Cardiac Monitoring: The patient was maintained on a cardiac monitor.  I personally viewed and interpreted the cardiac monitor which showed an underlying rhythm of:  sinus rhythm  Medicines ordered and prescription drug management: I ordered medication including IV antibiotics and fluids for dehydration and UTI Reevaluation of the patient after these medicines showed that the patient    improved   Reevaluation: After the interventions noted above, I reevaluated the patient and found that they have :improved  Complexity of problems addressed: Patient's presentation is most consistent with  acute presentation with potential threat to life or bodily function  Disposition: After consideration of the diagnostic results and the patient's response to treatment,  I feel that the patent would benefit from discharge   .           Final Clinical Impression(s) / ED Diagnoses Final diagnoses:  Dehydration  Acute cystitis without hematuria    Rx / DC Orders ED Discharge Orders          Ordered    cephALEXin (KEFLEX) 500 MG capsule  2 times daily        08/12/22 0529              Zadie Rhine, MD 08/12/22 [phone]

## 2022-08-13 ENCOUNTER — Telehealth: Payer: Self-pay

## 2022-08-13 NOTE — Telephone Encounter (Signed)
Consult and orders in for home health. Contacted Clelia Croft from [facility] and he accepted. Calling patient to notify her of agency

## 2022-08-17 ENCOUNTER — Encounter: Payer: Self-pay | Admitting: Internal Medicine

## 2022-08-17 ENCOUNTER — Ambulatory Visit (INDEPENDENT_AMBULATORY_CARE_PROVIDER_SITE_OTHER): Payer: Medicare Other | Admitting: Internal Medicine

## 2022-08-17 VITALS — BP 168/87 | HR 82 | Temp 97.8°F | Wt 150.6 lb

## 2022-08-17 DIAGNOSIS — R531 Weakness: Secondary | ICD-10-CM

## 2022-08-17 DIAGNOSIS — Z09 Encounter for follow-up examination after completed treatment for conditions other than malignant neoplasm: Secondary | ICD-10-CM

## 2022-08-17 DIAGNOSIS — R627 Adult failure to thrive: Secondary | ICD-10-CM

## 2022-08-17 DIAGNOSIS — E039 Hypothyroidism, unspecified: Secondary | ICD-10-CM | POA: Diagnosis not present

## 2022-08-17 DIAGNOSIS — M5410 Radiculopathy, site unspecified: Secondary | ICD-10-CM

## 2022-08-17 DIAGNOSIS — I10 Essential (primary) hypertension: Secondary | ICD-10-CM

## 2022-08-17 DIAGNOSIS — N3000 Acute cystitis without hematuria: Secondary | ICD-10-CM

## 2022-08-17 NOTE — Progress Notes (Signed)
Established Patient Office Visit     CC/Reason for Visit: Hospital follow-up, discuss worsening chronic conditions  HPI: Megan Glover is a 77 y.o. female who is coming in today for the above mentioned reasons.  She has a complex medical history mainly significant for chronic degenerative disc disease and back pain with left-sided radiculopathy that has now worsened.  She was recently seen in the emergency department 4 days ago with UTI and acute failure to thrive.  She is currently on cephalexin.  Home health PT has been ordered and first visit is scheduled for tomorrow.  She has lost a significant amount of weight over the last year.  She has issues with mobility.   Past Medical/Surgical History: Past Medical History:  Diagnosis Date   Anemia    Anxiety    Has tried Celexa and effexor, poor tolerance.   Cataract    bilateral -left > right   Clostridium difficile diarrhea    05-09-14 denies any problems at this time   COPD (chronic obstructive pulmonary disease) (HCC)    Depression    Fibromyalgia    History of diverticulosis    Hyperlipidemia    Statins aggravated myalgias:Pravastatin and Atorvastatin among some.   Hypertension    Hypothyroidism    IBS (irritable bowel syndrome)    Neuromuscular disorder (HCC)    fibromyalgia   Thyroid disease     Past Surgical History:  Procedure Laterality Date   Cataract removal 2015 Bilateral    CLAVICLE SURGERY Left    September 2022   DILATION AND CURETTAGE OF UTERUS     FRACTURE SURGERY     Right femoral fx 02/22/14   INGUINAL HERNIA REPAIR Bilateral 05/16/2014   Procedure: LAPAROSCOPIC EXPLORATION AND REPAIR OF BILATERAL FEMEROL AND BILATERAL INGUINAL HERNIAS  WITH MESH;  Surgeon: Michael Boston, MD;  Location: [facility];  Service: General;  Laterality: Bilateral;   INTRAMEDULLARY (IM) NAIL INTERTROCHANTERIC Right 02/21/2014   Procedure: INTRAMEDULLARY (IM) NAIL INTERTROCHANTRIC;  Surgeon: Johnny Bridge, MD;   Location: [facility];  Service: Orthopedics;  Laterality: Right;    Social History:  reports that she quit smoking about 42 years ago. Her smoking use included cigarettes. She has a 37.50 pack-year smoking history. She has never used smokeless tobacco. She reports that she does not drink alcohol and does not use drugs.  Allergies: Allergies  Allergen Reactions   Sulfa Antibiotics Other (See Comments) and Itching    Anal itching "anal itching"   Demerol  [Meperidine Hcl] Nausea And Vomiting   Levofloxacin In D5w Other (See Comments)    Yeast infections.  "Yeast infections"   Spironolactone Other (See Comments)    Weakness/malaise "Weakness" Other reaction(s): Other (See Comments) Weakness/malaise   Clindamycin     Other reaction(s): Other (See Comments)   Clindamycin/Lincomycin     CDIF   Erythromycin Diarrhea   Erythromycin Base     OTHER REACTION(S): Diarrhea Other reaction(s): Other (See Comments) OTHER REACTION(S): Diarrhea   Fluconazole Other (See Comments)   Hydrochlorothiazide Other (See Comments)    Weakness/malaise Other reaction(s): Other (See Comments) Weakness/malaise   Lincomycin Diarrhea    CDIF   Meperidine Nausea And Vomiting, Other (See Comments) and Hypertension    Severe headache OTHER REACTION(S): Hypertension Severe headache   Ciprofloxacin Diarrhea   Levofloxacin Nausea Only    Yeast infection OTHER REACTION(S): Diarrhea Other reaction(s): Other (See Comments) Yeast infection   Metronidazole Nausea Only    OTHER REACTION(S): Diarrhea OTHER REACTION(S):  Diarrhea    Other Itching    Family History:  Family History  Problem Relation Age of Onset   Breast cancer Mother        breast CA   Diabetes Father    Hypertension Father    Colon cancer Other    Breast cancer Sister    Breast cancer Sister    Cancer Sister        breast   Cancer Sister        breast     Current Outpatient Medications:    acetaminophen (TYLENOL) 500 MG tablet,  Take 1,000 mg by mouth every 8 (eight) hours as needed for moderate pain., Disp: , Rfl:    albuterol (PROVENTIL) (2.5 MG/3ML) 0.083% nebulizer solution, Take 3 mLs (2.5 mg total) by nebulization every 6 (six) hours as needed for wheezing or shortness of breath., Disp: 150 mL, Rfl: 2   albuterol (VENTOLIN HFA) 108 (90 Base) MCG/ACT inhaler, INHALE 2 PUFFS INTO THE LUNGS EVERY 4 HOURS AS NEEDED, Disp: 8.5 g, Rfl: 1   amLODipine (NORVASC) 2.5 MG tablet, TAKE 1 TABLET(2.5 MG) BY MOUTH DAILY, Disp: 90 tablet, Rfl: 1   aspirin 81 MG chewable tablet, Chew 81 mg by mouth., Disp: , Rfl:    Biotin 1000 MCG tablet, Take by mouth., Disp: , Rfl:    cephALEXin (KEFLEX) 500 MG capsule, Take 1 capsule (500 mg total) by mouth 2 (two) times daily., Disp: 14 capsule, Rfl: 0   Cholecalciferol (VITAMIN D3) 2000 units TABS, Take 1 tablet by mouth daily., Disp: , Rfl:    clonazePAM (KLONOPIN) 0.5 MG tablet, TAKE 1 TABLET(0.5 MG) BY MOUTH TWICE DAILY AS NEEDED FOR ANXIETY, Disp: 60 tablet, Rfl: 3   levothyroxine (SYNTHROID) 50 MCG tablet, TAKE 1 TABLET(50 MCG) BY MOUTH DAILY BEFORE BREAKFAST, Disp: 90 tablet, Rfl: 3   lidocaine (XYLOCAINE) 5 % ointment, Apply 1 application topically as needed., Disp: 50 g, Rfl: 2   losartan (COZAAR) 100 MG tablet, TAKE 1 TABLET(100 MG) BY MOUTH EVERY MORNING, Disp: 90 tablet, Rfl: 3   meclizine (ANTIVERT) 25 MG tablet, Take 0.5-1 tablets (12.5-25 mg total) by mouth daily as needed for dizziness., Disp: 30 tablet, Rfl: 0   OVER THE COUNTER MEDICATION, Apply 2 drops to eye at bedtime as needed (for itchy dry eyes.)., Disp: , Rfl:    PROCTOZONE-HC 2.5 % rectal cream, USE RECTALLY EVERY DAY AS NEEDED, Disp: 30 g, Rfl: 1   rosuvastatin (CRESTOR) 10 MG tablet, Take 1 tablet (10 mg total) by mouth daily., Disp: 30 tablet, Rfl: 3   tiZANidine (ZANAFLEX) 4 MG tablet, TAKE 1 TABLET(4 MG) BY MOUTH EVERY 12 HOURS AS NEEDED FOR MUSCLE SPASMS, Disp: 30 tablet, Rfl: 0   clonazePAM (KLONOPIN) 1 MG tablet,  Take 0.5 tablets (0.5 mg total) by mouth 2 (two) times daily as needed for anxiety., Disp: 15 tablet, Rfl: 0  Review of Systems:  Constitutional: Denies fever, chills, diaphoresis. HEENT: Denies photophobia, eye pain, redness, hearing loss, ear pain, congestion, sore throat, rhinorrhea, sneezing, mouth sores, trouble swallowing, neck pain, neck stiffness and tinnitus.   Respiratory: Denies SOB, DOE, cough, chest tightness,  and wheezing.   Cardiovascular: Denies chest pain, palpitations and leg swelling.  Gastrointestinal: Denies nausea, vomiting, abdominal pain, diarrhea, constipation, blood in stool and abdominal distention.  Genitourinary: Denies dysuria, urgency, frequency, hematuria, flank pain and difficulty urinating.  Endocrine: Denies: hot or cold intolerance, sweats, changes in hair or nails, polyuria, polydipsia. Hematological: Denies adenopathy. Easy bruising, personal  or family bleeding history  Psychiatric/Behavioral: Denies suicidal ideation, mood changes, confusion, nervousness, sleep disturbance and agitation    Physical Exam: Vitals:   08/17/22 1416 08/17/22 1423  BP: (!) 150/80 (!) 168/87  Pulse: 82   Temp: 97.8 F (36.6 C)   TempSrc: Oral   SpO2: 99%   Weight: 150 lb 9.6 oz (68.3 kg)     Body mass index is 23.59 kg/m.   Constitutional: NAD, calm, comfortable, seen in wheelchair, can ambulate slowly with a rolling walker. Eyes: PERRL, lids and conjunctivae normal ENMT: Mucous membranes are moist. Psychiatric: Normal judgment and insight. Alert and oriented x 3. Normal mood.    Impression and Plan:  Hospital discharge follow-up  Essential hypertension  Failure to thrive in adult  Hypothyroidism, unspecified type  Acute cystitis without hematuria  Generalized weakness  Back pain with left-sided radiculopathy - Plan: Ambulatory referral to Neurosurgery  [facility] charts reviewed in detail.  She was diagnosed with adult failure to thrive, acute  dehydration and a UTI.  She remains on cephalexin.  Physical therapy is coming out tomorrow for their initial evaluation. -Given these issues are chronic and progressive, I think a referral to neurosurgery is in order.  I do not see an MRI on file for her.  She used to have only left-sided radiculopathy but now also has right radiculopathy. -She has adult failure to thrive with significant malnutrition and 50 pound weight loss in the last year.  I think a conversation with her PCP in regards to advance care directives may be in order. -Advised to complete out course of antibiotics as prescribed.  She still has 3 days remaining.  She will continue taking Pedialyte, have advised son to encourage oral fluids. -She will schedule follow-up with her primary care physician for full workup.  Given her massive weight loss I think checking her thyroid and vitamin B12 levels as well as basic chemistries is in order.  Time spent:34 minutes reviewing chart, interviewing and examining patient and formulating plan of care.      Chaya Jan, MD [facility]

## 2022-08-23 NOTE — Progress Notes (Unsigned)
HPI: Megan Glover is a 77 y.o. female with PMHx significant for PAD,HTN,peripheral neuropathy, chronic pain, anxiety, and hypothyroidism here today with her son to follow on recent visit.  She was evaluated on 08/17/22 for ED follow up. UA abnormal, she was started on Cephalexin. She has completed a course of antibiotics for a urinary tract infection but still feels symptoms have not resolved.  Hx of atrophic vaginitis.  C/O suprapubic and generalized abdominal pressure, sometimes she still has burning sensation. Negative for increased urinary frequency, gross hematuria,or decreased urine output. States that she has been performing Kegel exercises to help with chronic urinary frequency.  Today she is c/o ear fullness, reporting that her ears feel plugged up, which is affecting her hearing. Problem is constant. She denies any associated earache or drainage. She  has a history of hearing loss. No recent travel or URI.  HTN: She states that  her home blood pressure readings have been 130's/70-80's. She is currently taking Amlodipine 2.5 mg daily and Losartan 100 mg daily.  Negative for severe/frequent headache, visual changes, chest pain, palpitation, focal weakness, or edema. Lab Results  Component Value Date   CREATININE 0.65 08/11/2022   BUN 28 (H) 08/11/2022   NA 133 (L) 08/11/2022   K 4.5 08/11/2022   CL 98 08/11/2022   CO2 27 08/11/2022   IBS with chronic constipation and abdominal pain. She reports occasional blood in her stool.  + Dyschezia. Proctozone rectal cream has not helped. She has not had a bowel movement in approximately eight days.OTC laxatives or stool softens do not help. She has seen advertised for Crohn's disease, wonders if she needs to be screened for this condition. Her last colonoscopy was in 11/2016, where polyps were found, and she was advised to have a follow-up in five years. Abdominal discomfort improves tempora rely with defecation and  passing gas.It is exacerbated by certain foods.  Chronic back pain and leg issues, expressing a desire for a referral to an orthopedic. Her son wonders if she benefits from epidural injections. LE pain, numbness worse at night, affecting her sleep. Negate for saddle anesthesia.  She has experienced recent weight loss and has been consuming smaller meals due to GI symptoms and financial difficulties. She has has previously used Ensure but she cannot afford it.  She also reports DOE, which has been going on for years and reported as stable. Occasional associated coughing and wheezing, dry mouth at night. She uses Albuterol inh as needed. Negative for orthopnea or PND. She has seen a cardiologist in the past and underwent a stress test in 2014: Moderate diverticulosis, internal hemorrhoids,and polyp.   GAD: She is currently taking clonazepam 0.5 mg bid prn. She has taken this medication for years and still helping.  PAD: She has discontinued Crestor due to achiness and dizziness. Follows with vascular regularly.  Lab Results  Component Value Date   WBC 9.0 08/11/2022   HGB 12.1 08/11/2022   HCT 35.3 (L) 08/11/2022   MCV 91.0 08/11/2022   PLT 270 08/11/2022   Hypothyroidism: She is on Levothyroxine 50 mcg daily. Lab Results  Component Value Date   TSH 1.22 03/05/2021   Review of Systems  Constitutional:  Positive for fatigue. Negative for activity change and appetite change.  HENT:  Negative for nosebleeds and sore throat.   Gastrointestinal:  Negative for nausea and vomiting.  Endocrine: Negative for cold intolerance and heat intolerance.  Genitourinary:  Negative for decreased urine volume.  Musculoskeletal:  Positive  for arthralgias, back pain, gait problem and myalgias.  Skin:  Negative for rash.  Neurological:  Negative for syncope and facial asymmetry.  Psychiatric/Behavioral:  Negative for confusion and hallucinations. The patient is nervous/anxious.   See other pertinent  positives and negatives in HPI.  Current Outpatient Medications on File Prior to Visit  Medication Sig Dispense Refill   acetaminophen (TYLENOL) 500 MG tablet Take 1,000 mg by mouth every 8 (eight) hours as needed for moderate pain.     amLODipine (NORVASC) 2.5 MG tablet TAKE 1 TABLET(2.5 MG) BY MOUTH DAILY 90 tablet 1   aspirin 81 MG chewable tablet Chew 81 mg by mouth.     Biotin 1000 MCG tablet Take by mouth.     cephALEXin (KEFLEX) 500 MG capsule Take 1 capsule (500 mg total) by mouth 2 (two) times daily. 14 capsule 0   Cholecalciferol (VITAMIN D3) 2000 units TABS Take 1 tablet by mouth daily.     clonazePAM (KLONOPIN) 0.5 MG tablet TAKE 1 TABLET(0.5 MG) BY MOUTH TWICE DAILY AS NEEDED FOR ANXIETY 60 tablet 3   levothyroxine (SYNTHROID) 50 MCG tablet TAKE 1 TABLET(50 MCG) BY MOUTH DAILY BEFORE BREAKFAST 90 tablet 3   lidocaine (XYLOCAINE) 5 % ointment Apply 1 application topically as needed. 50 g 2   losartan (COZAAR) 100 MG tablet TAKE 1 TABLET(100 MG) BY MOUTH EVERY MORNING 90 tablet 3   meclizine (ANTIVERT) 25 MG tablet Take 0.5-1 tablets (12.5-25 mg total) by mouth daily as needed for dizziness. 30 tablet 0   OVER THE COUNTER MEDICATION Apply 2 drops to eye at bedtime as needed (for itchy dry eyes.).     PROCTOZONE-HC 2.5 % rectal cream USE RECTALLY EVERY DAY AS NEEDED 30 g 1   rosuvastatin (CRESTOR) 10 MG tablet Take 1 tablet (10 mg total) by mouth daily. 30 tablet 3   tiZANidine (ZANAFLEX) 4 MG tablet TAKE 1 TABLET(4 MG) BY MOUTH EVERY 12 HOURS AS NEEDED FOR MUSCLE SPASMS 30 tablet 0   clonazePAM (KLONOPIN) 1 MG tablet Take 0.5 tablets (0.5 mg total) by mouth 2 (two) times daily as needed for anxiety. 15 tablet 0   No current facility-administered medications on file prior to visit.    Past Medical History:  Diagnosis Date   Anemia    Anxiety    Has tried Celexa and effexor, poor tolerance.   Cataract    bilateral -left > right   Clostridium difficile diarrhea    05-09-14  denies any problems at this time   COPD (chronic obstructive pulmonary disease) (HCC)    Depression    Fibromyalgia    History of diverticulosis    Hyperlipidemia    Statins aggravated myalgias:Pravastatin and Atorvastatin among some.   Hypertension    Hypothyroidism    IBS (irritable bowel syndrome)    Neuromuscular disorder (HCC)    fibromyalgia   Thyroid disease    Allergies  Allergen Reactions   Sulfa Antibiotics Other (See Comments) and Itching    Anal itching "anal itching"   Demerol  [Meperidine Hcl] Nausea And Vomiting   Levofloxacin In D5w Other (See Comments)    Yeast infections.  "Yeast infections"   Spironolactone Other (See Comments)    Weakness/malaise "Weakness" Other reaction(s): Other (See Comments) Weakness/malaise   Clindamycin     Other reaction(s): Other (See Comments)   Clindamycin/Lincomycin     CDIF   Erythromycin Diarrhea   Erythromycin Base     OTHER REACTION(S): Diarrhea Other reaction(s): Other (See Comments) OTHER  REACTION(S): Diarrhea   Fluconazole Other (See Comments)   Hydrochlorothiazide Other (See Comments)    Weakness/malaise Other reaction(s): Other (See Comments) Weakness/malaise   Lincomycin Diarrhea    CDIF   Meperidine Nausea And Vomiting, Other (See Comments) and Hypertension    Severe headache OTHER REACTION(S): Hypertension Severe headache   Ciprofloxacin Diarrhea   Levofloxacin Nausea Only    Yeast infection OTHER REACTION(S): Diarrhea Other reaction(s): Other (See Comments) Yeast infection   Metronidazole Nausea Only    OTHER REACTION(S): Diarrhea OTHER REACTION(S): Diarrhea    Other Itching    Social History   Socioeconomic History   Marital status: Widowed    Spouse name: Mattalyn Anderegg   Number of children: Not on file   Years of education: Not on file   Highest education level: Not on file  Occupational History   Not on file  Tobacco Use   Smoking status: Former    Packs/day: 1.50    Years:  25.00    Total pack years: 37.50    Types: Cigarettes    Quit date: 05/09/1980    Years since quitting: 42.3   Smokeless tobacco: Never  Vaping Use   Vaping Use: Never used  Substance and Sexual Activity   Alcohol use: No    Alcohol/week: 0.0 standard drinks of alcohol   Drug use: No   Sexual activity: Not Currently  Other Topics Concern   Not on file  Social History Narrative   Not on file   Social Determinants of Health   Financial Resource Strain: High Risk (09/02/2021)   Overall Financial Resource Strain (CARDIA)    Difficulty of Paying Living Expenses: Hard  Food Insecurity: No Food Insecurity (05/16/2022)   Hunger Vital Sign    Worried About Running Out of Food in the Last Year: Never true    Ran Out of Food in the Last Year: Never true  Transportation Needs: No Transportation Needs (05/16/2022)   PRAPARE - Administrator, Civil Service (Medical): No    Lack of Transportation (Non-Medical): No  Physical Activity: Unknown (09/02/2021)   Exercise Vital Sign    Days of Exercise per Week: Patient refused    Minutes of Exercise per Session: 0 min  Recent Concern: Physical Activity - Inactive (08/30/2021)   Exercise Vital Sign    Days of Exercise per Week: 0 days    Minutes of Exercise per Session: 0 min  Stress: Stress Concern Present (08/30/2021)   [employer] of Occupational Health - Occupational Stress Questionnaire    Feeling of Stress : To some extent  Social Connections: Moderately Integrated (06/23/2020)   Social Connection and Isolation Panel [NHANES]    Frequency of Communication with Friends and Family: Three times a week    Frequency of Social Gatherings with Friends and Family: Once a week    Attends Religious Services: More than 4 times per year    Active Member of Clubs or Organizations: No    Attends Banker Meetings: Never    Marital Status: Married   Vitals:   08/24/22 1420  BP: 132/80  Pulse: 100  Resp: 16  SpO2: 97%    Wt Readings from Last 3 Encounters:  08/17/22 150 lb 9.6 oz (68.3 kg)  08/11/22 110 lb (49.9 kg)  12/02/21 113 lb (51.3 kg)   Body mass index is 23.59 kg/m.  Physical Exam Vitals and nursing note reviewed.  Constitutional:      General: She is not in acute distress.  Appearance: She is well-developed.  HENT:     Head: Normocephalic and atraumatic.     Right Ear: External ear normal.     Left Ear: Tympanic membrane, ear canal and external ear normal.     Ears:     Comments: Right ear cerumen excess, cannot see TM.    Mouth/Throat:     Mouth: Mucous membranes are moist.  Eyes:     Conjunctiva/sclera: Conjunctivae normal.  Cardiovascular:     Rate and Rhythm: Normal rate and regular rhythm.     Heart sounds: No murmur heard. Pulmonary:     Effort: Pulmonary effort is normal. No respiratory distress.     Breath sounds: Normal breath sounds.  Abdominal:     Palpations: Abdomen is soft. There is no hepatomegaly or mass.     Tenderness: There is no abdominal tenderness.  Musculoskeletal:     Right lower leg: No edema.     Left lower leg: No edema.  Lymphadenopathy:     Cervical: No cervical adenopathy.  Skin:    General: Skin is warm.     Findings: No erythema or rash.  Neurological:     General: No focal deficit present.     Mental Status: She is alert and oriented to person, place, and time.     Cranial Nerves: No cranial nerve deficit.     Comments: Antalgic, unstable gait assisted with a walker.  Psychiatric:        Mood and Affect: Affect normal. Mood is anxious.   ASSESSMENT AND PLAN:  Ms.Trianna was seen today for follow-up.  Diagnoses and all orders for this visit: Lab Results  Component Value Date   TSH 1.62 08/24/2022   Essential hypertension Assessment & Plan: BP adequately controlled. Continue Amlodipine 2.5 mg daily and Losartan 100 mg daily. Continue monitoring BP regularly and following low salt diet.   Generalized anxiety  disorder Assessment & Plan: Problem is stable. Continue Clonazepam 0.5 mg bid prn. Some side effects discussed. F/U in 3-4 months.   Irritable bowel syndrome with constipation Assessment & Plan: Constipation is getting worse. Last bowel movement 8 days ago. Linzess 72 mg q 2 days recommended,some side effects discussed. Continue adequate fiber and fluid intake. GI referral placed to discuss colonoscopy, last one in 2018, 5 years f/u was recommended at that time.  Orders: -     linaCLOtide; Take 1 capsule (72 mcg total) by mouth daily before breakfast.  Dispense: 30 capsule; Refill: 2 -     Ambulatory referral to Gastroenterology  Chronic bilateral low back pain, unspecified whether sciatica present Assessment & Plan: Problem is getting worse, she would like to discuss treatment options , would like to see ortho. Fall prevention to continue.  Orders: -     Ambulatory referral to Orthopedics  Lower abdominal pain Assessment & Plan: We discussed possible etiologies. Can be caused by constipation. Instructed about warning signs. GI referral placed.  Orders: -     Urinalysis w microscopic + reflex cultur; Future  Weight loss, abnormal Assessment & Plan: Caused by limitation of food intake due to GI symptoms but also reporting difficulty affording certain food items. According to her son, she does not qualify for government financial assistance. Stressed the importance of having protein with her meals. Ensure once daily.   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 Following with vascular. Instructed about warning signs.   Chronic  obstructive pulmonary disease with acute exacerbation [facility]) Assessment & Plan: She is reporting symptoms as stable. She is not interested in adding new inhales,she has not tolerated Spiriva and LABA/ICS inhalers, the latter one because of oral sores. Continue  albuterol inh 2 puffs every 4-6 hours as needed.  Orders: -     Albuterol Sulfate HFA; INHALE 2 PUFFS INTO THE LUNGS EVERY 4 HOURS AS NEEDED  Dispense: 8.5 g; Refill: 2  Excessive cerumen in ear canal, right Assessment & Plan: Not impacted. Avoid Q tips. Ear lavage is not indicated at this time.   Hypothyroidism, unspecified type Assessment & Plan: Problem has been well controlled. Last TSH 1.2 in 02/2022. Continue Levothyroxine 50 mg daily.   Back pain with left-sided radiculopathy Assessment & Plan: Problem is getting worse, she would like to discuss treatment options , would like to see ortho. Fall prevention to continue.   I spent a total of 44 minutes in both face to face and non face to face activities for this visit on the date of this encounter. During this time history was obtained and documented, examination was performed, prior labs/imaging reviewed, and assessment/plan discussed.  Return in about 3 months (around 11/23/2022).  Derrian Poli G. [place], MD  [facility] [place] office.

## 2022-08-24 ENCOUNTER — Ambulatory Visit (INDEPENDENT_AMBULATORY_CARE_PROVIDER_SITE_OTHER): Payer: Medicare Other | Admitting: Family Medicine

## 2022-08-24 ENCOUNTER — Encounter: Payer: Self-pay | Admitting: Family Medicine

## 2022-08-24 VITALS — BP 132/80 | HR 100 | Resp 16 | Ht 67.0 in

## 2022-08-24 DIAGNOSIS — K581 Irritable bowel syndrome with constipation: Secondary | ICD-10-CM

## 2022-08-24 DIAGNOSIS — R1030 Lower abdominal pain, unspecified: Secondary | ICD-10-CM | POA: Insufficient documentation

## 2022-08-24 DIAGNOSIS — M5410 Radiculopathy, site unspecified: Secondary | ICD-10-CM

## 2022-08-24 DIAGNOSIS — I10 Essential (primary) hypertension: Secondary | ICD-10-CM | POA: Diagnosis not present

## 2022-08-24 DIAGNOSIS — M545 Low back pain, unspecified: Secondary | ICD-10-CM

## 2022-08-24 DIAGNOSIS — J441 Chronic obstructive pulmonary disease with (acute) exacerbation: Secondary | ICD-10-CM

## 2022-08-24 DIAGNOSIS — R634 Abnormal weight loss: Secondary | ICD-10-CM | POA: Diagnosis not present

## 2022-08-24 DIAGNOSIS — I7409 Other arterial embolism and thrombosis of abdominal aorta: Secondary | ICD-10-CM

## 2022-08-24 DIAGNOSIS — Z681 Body mass index (BMI) 19 or less, adult: Secondary | ICD-10-CM | POA: Insufficient documentation

## 2022-08-24 DIAGNOSIS — H6121 Impacted cerumen, right ear: Secondary | ICD-10-CM | POA: Insufficient documentation

## 2022-08-24 DIAGNOSIS — F411 Generalized anxiety disorder: Secondary | ICD-10-CM

## 2022-08-24 DIAGNOSIS — G8929 Other chronic pain: Secondary | ICD-10-CM

## 2022-08-24 DIAGNOSIS — R1084 Generalized abdominal pain: Secondary | ICD-10-CM

## 2022-08-24 DIAGNOSIS — E039 Hypothyroidism, unspecified: Secondary | ICD-10-CM

## 2022-08-24 MED ORDER — LINACLOTIDE 72 MCG PO CAPS: 72.0000 ug | ORAL_CAPSULE | Freq: Every day | ORAL | 2 refills | Status: DC

## 2022-08-24 MED ORDER — ALBUTEROL SULFATE HFA 108 (90 BASE) MCG/ACT IN AERS: INHALATION_SPRAY | RESPIRATORY_TRACT | 2 refills | Status: DC

## 2022-08-24 NOTE — Assessment & Plan Note (Signed)
BP adequately controlled. Continue Amlodipine 2.5 mg daily and Losartan 100 mg daily. Continue monitoring BP regularly and following low salt diet.

## 2022-08-24 NOTE — Patient Instructions (Addendum)
A few things to remember from today's visit:  Essential hypertension  Generalized anxiety disorder  Irritable bowel syndrome with constipation - Plan: linaclotide (LINZESS) 72 MCG capsule, Ambulatory referral to Gastroenterology  Chronic bilateral low back pain, unspecified whether sciatica present - Plan: AMB referral to orthopedics  Generalized abdominal pain - Plan: Urinalysis with Culture Reflex  Weight loss, abnormal - Plan: TSH  Linzess 72 mcg every other day. Continue adequate hydration dn fiber intake. One ensure daily. Increase protein intake.  If you need refills for medications you take chronically, please call your pharmacy. Do not use My Chart to request refills or for acute issues that need immediate attention. If you send a my chart message, it may take a few days to be addressed, specially if I am not in the office.  Please be sure medication list is accurate. If a new problem present, please set up appointment sooner than planned today.

## 2022-08-24 NOTE — Assessment & Plan Note (Signed)
She has not tolerated statin medications. Following with vascular. Instructed about warning signs.

## 2022-08-25 LAB — TSH: TSH: 1.62 u[IU]/mL (ref 0.35–5.50)

## 2022-08-25 NOTE — Assessment & Plan Note (Signed)
Not impacted. Avoid Q tips. Ear lavage is not indicated at this time.

## 2022-08-25 NOTE — Assessment & Plan Note (Addendum)
Problem is getting worse, she would like to discuss treatment options , would like to see ortho. Fall prevention to continue.

## 2022-08-25 NOTE — Assessment & Plan Note (Signed)
She is reporting symptoms as stable. She is not interested in adding new inhales,she has not tolerated Spiriva and LABA/ICS inhalers, the latter one because of oral sores. Continue albuterol inh 2 puffs every 4-6 hours as needed.

## 2022-08-25 NOTE — Assessment & Plan Note (Signed)
We discussed possible etiologies. Can be caused by constipation. Instructed about warning signs. GI referral placed.

## 2022-08-25 NOTE — Assessment & Plan Note (Signed)
Caused by limitation of food intake due to GI symptoms but also reporting difficulty affording certain food items. According to her son, she does not qualify for government financial assistance. Stressed the importance of having protein with her meals. Ensure once daily.

## 2022-08-25 NOTE — Assessment & Plan Note (Signed)
Problem has been well controlled. Last TSH 1.2 in 02/2022. Continue Levothyroxine 50 mg daily.

## 2022-08-25 NOTE — Assessment & Plan Note (Signed)
Stable. Continue Clonazepam 0.5 mg bid prn.

## 2022-08-25 NOTE — Assessment & Plan Note (Signed)
Constipation is getting worse. Last bowel movement 8 days ago. Linzess 72 mg q 2 days recommended,some side effects discussed. Continue adequate fiber and fluid intake. GI referral placed to discuss colonoscopy, last one in 2018, 5 years f/u was recommended at that time.

## 2022-08-29 LAB — URINALYSIS W MICROSCOPIC + REFLEX CULTURE
Bilirubin Urine: NEGATIVE
Glucose, UA: NEGATIVE
Hgb urine dipstick: NEGATIVE
Ketones, ur: NEGATIVE
Nitrites, Initial: NEGATIVE
Specific Gravity, Urine: 1.021 (ref 1.001–1.035)
pH: 7 (ref 5.0–8.0)

## 2022-08-29 LAB — URINE CULTURE
MICRO NUMBER:: 14429451
SPECIMEN QUALITY:: ADEQUATE

## 2022-08-29 LAB — CULTURE INDICATED

## 2022-09-02 ENCOUNTER — Other Ambulatory Visit: Payer: Self-pay

## 2022-09-02 MED ORDER — NITROFURANTOIN MONOHYD MACRO 100 MG PO CAPS: 100.0000 mg | ORAL_CAPSULE | Freq: Two times a day (BID) | ORAL | 0 refills | Status: AC

## 2022-09-12 ENCOUNTER — Telehealth: Payer: Self-pay | Admitting: Family Medicine

## 2022-09-12 NOTE — Telephone Encounter (Signed)
Pt called back to say she is not interested in this type of visit and does not want to be scheduled.

## 2022-09-12 NOTE — Telephone Encounter (Signed)
Left message for patient to call back and schedule Medicare Annual Wellness Visit (AWV) either virtually or in office. Left  my jabber number [phone]   Last AWV 08/30/21 please schedule with Nurse Health Adviser   45 min for awv-i  in office appointments 30 min for awv-s & awv-i phone/virtual appointments  

## 2022-09-19 NOTE — Progress Notes (Unsigned)
ACUTE VISIT No chief complaint on file.  HPI: [name] is a 77 y.o. female, who is here today complaining of *** HPI  Review of Systems See other pertinent positives and negatives in HPI.  Current Outpatient Medications on File Prior to Visit  Medication Sig Dispense Refill   acetaminophen (TYLENOL) 500 MG tablet Take 1,000 mg by mouth every 8 (eight) hours as needed for moderate pain.     albuterol (VENTOLIN HFA) 108 (90 Base) MCG/ACT inhaler INHALE 2 PUFFS INTO THE LUNGS EVERY 4 HOURS AS NEEDED 8.5 g 2   amLODipine (NORVASC) 2.5 MG tablet TAKE 1 TABLET(2.5 MG) BY MOUTH DAILY 90 tablet 1   aspirin 81 MG chewable tablet Chew 81 mg by mouth.     Biotin 1000 MCG tablet Take by mouth.     cephALEXin (KEFLEX) 500 MG capsule Take 1 capsule (500 mg total) by mouth 2 (two) times daily. 14 capsule 0   Cholecalciferol (VITAMIN D3) 2000 units TABS Take 1 tablet by mouth daily.     clonazePAM (KLONOPIN) 0.5 MG tablet TAKE 1 TABLET(0.5 MG) BY MOUTH TWICE DAILY AS NEEDED FOR ANXIETY 60 tablet 3   clonazePAM (KLONOPIN) 1 MG tablet Take 0.5 tablets (0.5 mg total) by mouth 2 (two) times daily as needed for anxiety. 15 tablet 0   levothyroxine (SYNTHROID) 50 MCG tablet TAKE 1 TABLET(50 MCG) BY MOUTH DAILY BEFORE BREAKFAST 90 tablet 3   lidocaine (XYLOCAINE) 5 % ointment Apply 1 application topically as needed. 50 g 2   linaclotide (LINZESS) 72 MCG capsule Take 1 capsule (72 mcg total) by mouth daily before breakfast. 30 capsule 2   losartan (COZAAR) 100 MG tablet TAKE 1 TABLET(100 MG) BY MOUTH EVERY MORNING 90 tablet 3   meclizine (ANTIVERT) 25 MG tablet Take 0.5-1 tablets (12.5-25 mg total) by mouth daily as needed for dizziness. 30 tablet 0   OVER THE COUNTER MEDICATION Apply 2 drops to eye at bedtime as needed (for itchy dry eyes.).     PROCTOZONE-HC 2.5 % rectal cream USE RECTALLY EVERY DAY AS NEEDED 30 g 1   rosuvastatin (CRESTOR) 10 MG tablet Take 1 tablet (10 mg total) by mouth  daily. 30 tablet 3   tiZANidine (ZANAFLEX) 4 MG tablet TAKE 1 TABLET(4 MG) BY MOUTH EVERY 12 HOURS AS NEEDED FOR MUSCLE SPASMS 30 tablet 0   No current facility-administered medications on file prior to visit.    Past Medical History:  Diagnosis Date   Anemia    Anxiety    Has tried Celexa and effexor, poor tolerance.   Cataract    bilateral -left > right   Clostridium difficile diarrhea    05-09-14 denies any problems at this time   COPD (chronic obstructive pulmonary disease) (HCC)    Depression    Fibromyalgia    History of diverticulosis    Hyperlipidemia    Statins aggravated myalgias:Pravastatin and Atorvastatin among some.   Hypertension    Hypothyroidism    IBS (irritable bowel syndrome)    Neuromuscular disorder (HCC)    fibromyalgia   Thyroid disease    Allergies  Allergen Reactions   Sulfa Antibiotics Other (See Comments) and Itching    Anal itching "anal itching"   Demerol  [Meperidine Hcl] Nausea And Vomiting   Levofloxacin In D5w Other (See Comments)    Yeast infections.  "Yeast infections"   Spironolactone Other (See Comments)    Weakness/malaise "Weakness" Other reaction(s): Other (See Comments) Weakness/malaise   Clindamycin  Other reaction(s): Other (See Comments)   Clindamycin/Lincomycin     CDIF   Erythromycin Diarrhea   Erythromycin Base     OTHER REACTION(S): Diarrhea Other reaction(s): Other (See Comments) OTHER REACTION(S): Diarrhea   Fluconazole Other (See Comments)   Hydrochlorothiazide Other (See Comments)    Weakness/malaise Other reaction(s): Other (See Comments) Weakness/malaise   Lincomycin Diarrhea    CDIF   Meperidine Nausea And Vomiting, Other (See Comments) and Hypertension    Severe headache OTHER REACTION(S): Hypertension Severe headache   Ciprofloxacin Diarrhea   Levofloxacin Nausea Only    Yeast infection OTHER REACTION(S): Diarrhea Other reaction(s): Other (See Comments) Yeast infection   Metronidazole  Nausea Only    OTHER REACTION(S): Diarrhea OTHER REACTION(S): Diarrhea    Other Itching    Social History   Socioeconomic History   Marital status: Widowed    Spouse name: Megan Glover   Number of children: Not on file   Years of education: Not on file   Highest education level: Not on file  Occupational History   Not on file  Tobacco Use   Smoking status: Former    Packs/day: 1.50    Years: 25.00    Total pack years: 37.50    Types: Cigarettes    Quit date: 05/09/1980    Years since quitting: 42.3   Smokeless tobacco: Never  Vaping Use   Vaping Use: Never used  Substance and Sexual Activity   Alcohol use: No    Alcohol/week: 0.0 standard drinks of alcohol   Drug use: No   Sexual activity: Not Currently  Other Topics Concern   Not on file  Social History Narrative   Not on file   Social Determinants of Health   Financial Resource Strain: High Risk (09/02/2021)   Overall Financial Resource Strain (CARDIA)    Difficulty of Paying Living Expenses: Hard  Food Insecurity: No Food Insecurity (05/16/2022)   Hunger Vital Sign    Worried About Running Out of Food in the Last Year: Never true    Ran Out of Food in the Last Year: Never true  Transportation Needs: No Transportation Needs (05/16/2022)   PRAPARE - Hydrologist (Medical): No    Lack of Transportation (Non-Medical): No  Physical Activity: Unknown (09/02/2021)   Exercise Vital Sign    Days of Exercise per Week: Patient refused    Minutes of Exercise per Session: Not on file  Recent Concern: Physical Activity - Inactive (08/30/2021)   Exercise Vital Sign    Days of Exercise per Week: 0 days    Minutes of Exercise per Session: 0 min  Stress: Stress Concern Present (08/30/2021)   [place]    Feeling of Stress : To some extent  Social Connections: Moderately Integrated (06/23/2020)   Social Connection and Isolation  Panel [NHANES]    Frequency of Communication with Friends and Family: Three times a week    Frequency of Social Gatherings with Friends and Family: Once a week    Attends Religious Services: More than 4 times per year    Active Member of [employer] or Organizations: No    Attends Archivist Meetings: Never    Marital Status: Married    There were no vitals filed for this visit. There is no height or weight on file to calculate BMI.  Physical Exam  ASSESSMENT AND PLAN: There are no diagnoses linked to this encounter.  No follow-ups on file.  Tikesha Mort G. Martin[place], MD  [facility]. [place] office.  Discharge Instructions   None

## 2022-09-20 ENCOUNTER — Encounter: Payer: Self-pay | Admitting: Family Medicine

## 2022-09-20 ENCOUNTER — Ambulatory Visit (INDEPENDENT_AMBULATORY_CARE_PROVIDER_SITE_OTHER): Payer: Medicare Other | Admitting: Family Medicine

## 2022-09-20 VITALS — BP 126/80 | HR 100 | Resp 16 | Ht 67.0 in | Wt 107.0 lb

## 2022-09-20 DIAGNOSIS — M810 Age-related osteoporosis without current pathological fracture: Secondary | ICD-10-CM | POA: Diagnosis not present

## 2022-09-20 DIAGNOSIS — K581 Irritable bowel syndrome with constipation: Secondary | ICD-10-CM

## 2022-09-20 DIAGNOSIS — L298 Other pruritus: Secondary | ICD-10-CM | POA: Diagnosis not present

## 2022-09-20 MED ORDER — TRIAMCINOLONE ACETONIDE 0.1 % EX CREA: 1.0000 | TOPICAL_CREAM | Freq: Two times a day (BID) | CUTANEOUS | 1 refills | Status: DC

## 2022-09-20 NOTE — Assessment & Plan Note (Signed)
This has been a chronic problem and mildly worse. Last visit GI referral placed. She had her last colonoscopy in 11/2016 at [facility] and 3-5 years follow-up was recommended depending of pathology report.  Continue adequate fiber and fluid intake. She can try Senokot and MiraLAX daily as needed. Instructed about warning signs.

## 2022-09-20 NOTE — Patient Instructions (Signed)
A few things to remember from today's visit:  Pruritic erythematous rash - Plan: triamcinolone cream (KENALOG) 0.1 %  Constipation, unspecified constipation type  Triamcinolone 2 times daily for 14 days then as needed. At night wear a cotton glove after applying cream.  Continue with colace or miralax plus Senakot at night for constipation.  If you need refills for medications you take chronically, please call your pharmacy. Do not use My Chart to request refills or for acute issues that need immediate attention. If you send a my chart message, it may take a few days to be addressed, specially if I am not in the office.  Please be sure medication list is accurate. If a new problem present, please set up appointment sooner than planned today.

## 2022-09-20 NOTE — Assessment & Plan Note (Signed)
We discussed possible etiologies,?  Eczema.  Due to nutritional state, vitamin deficiencies is in the differential diagnosis;although she reports that she has increased oral intake. Last wt was mistakenly documented ad 150 Lb, ir was 105 Lb. Recommend starting a daily multivitamin.  Triamcinolone cream small amount twice daily for 14 days and then as needed recommended. If problem is persistent, we may need dermatology referral.

## 2022-09-20 NOTE — Assessment & Plan Note (Signed)
Continue adequate calcium and vitamin D supplementation. Fall precautions discussed. For now she is not interested in repeating DEXA, she is afraid of side effects of medications.  She will call if she changes her mind.

## 2022-09-21 ENCOUNTER — Other Ambulatory Visit: Payer: Self-pay | Admitting: Neurological Surgery

## 2022-09-21 DIAGNOSIS — M8448XG Pathological fracture, other site, subsequent encounter for fracture with delayed healing: Secondary | ICD-10-CM

## 2022-09-27 ENCOUNTER — Other Ambulatory Visit: Payer: Self-pay | Admitting: Family Medicine

## 2022-09-27 ENCOUNTER — Other Ambulatory Visit: Payer: Self-pay | Admitting: Neurological Surgery

## 2022-09-27 DIAGNOSIS — F411 Generalized anxiety disorder: Secondary | ICD-10-CM

## 2022-09-27 DIAGNOSIS — M8448XG Pathological fracture, other site, subsequent encounter for fracture with delayed healing: Secondary | ICD-10-CM

## 2022-09-30 NOTE — Telephone Encounter (Signed)
Patient adds that her anxiety level is rising

## 2022-09-30 NOTE — Telephone Encounter (Signed)
Patient calling to check on progress of refill. Says she is almost out of meds.

## 2022-10-03 NOTE — Telephone Encounter (Signed)
Pt called to say she is completely out of this medication and the pharmacy is still waiting for MD to send the PA .   Pt states she is a nervous wreck and really needs this refill ASAP!!  [place], [place] - [address] AT [address] OF [address] Phone: [phone]  Fax: [phone]

## 2022-10-10 ENCOUNTER — Other Ambulatory Visit: Payer: Self-pay | Admitting: Family Medicine

## 2022-10-10 DIAGNOSIS — I10 Essential (primary) hypertension: Secondary | ICD-10-CM

## 2022-10-19 ENCOUNTER — Other Ambulatory Visit: Payer: Self-pay | Admitting: Family Medicine

## 2022-10-19 DIAGNOSIS — R42 Dizziness and giddiness: Secondary | ICD-10-CM

## 2022-10-31 NOTE — Telephone Encounter (Signed)
Documented on spreadsheet 

## 2022-11-01 NOTE — Progress Notes (Unsigned)
ACUTE VISIT No chief complaint on file.  HPI: Megan Glover is a 77 y.o. female, who is here today complaining of *** HPI  Review of Systems See other pertinent positives and negatives in HPI.  Current Outpatient Medications on File Prior to Visit  Medication Sig Dispense Refill   acetaminophen (TYLENOL) 500 MG tablet Take 1,000 mg by mouth every 8 (eight) hours as needed for moderate pain.     albuterol (VENTOLIN HFA) 108 (90 Base) MCG/ACT inhaler INHALE 2 PUFFS INTO THE LUNGS EVERY 4 HOURS AS NEEDED 8.5 g 2   amLODipine (NORVASC) 2.5 MG tablet TAKE 1 TABLET(2.5 MG) BY MOUTH DAILY 90 tablet 1   aspirin 81 MG chewable tablet Chew 81 mg by mouth.     Biotin 1000 MCG tablet Take by mouth.     Cholecalciferol (VITAMIN D3) 2000 units TABS Take 1 tablet by mouth daily.     clonazePAM (KLONOPIN) 0.5 MG tablet TAKE 1 TABLET(0.5 MG) BY MOUTH TWICE DAILY AS NEEDED FOR ANXIETY 60 tablet 3   levothyroxine (SYNTHROID) 50 MCG tablet TAKE 1 TABLET(50 MCG) BY MOUTH DAILY BEFORE BREAKFAST 90 tablet 3   lidocaine (XYLOCAINE) 5 % ointment Apply 1 application topically as needed. 50 g 2   losartan (COZAAR) 100 MG tablet TAKE 1 TABLET(100 MG) BY MOUTH EVERY MORNING 90 tablet 3   meclizine (ANTIVERT) 25 MG tablet TAKE 1/2 TO 1 TABLET(12.5 TO 25 MG) BY MOUTH DAILY AS NEEDED FOR DIZZINESS 30 tablet 0   OVER THE COUNTER MEDICATION Apply 2 drops to eye at bedtime as needed (for itchy dry eyes.).     PROCTOZONE-HC 2.5 % rectal cream USE RECTALLY EVERY DAY AS NEEDED 30 g 1   rosuvastatin (CRESTOR) 10 MG tablet Take 1 tablet (10 mg total) by mouth daily. 30 tablet 3   tiZANidine (ZANAFLEX) 4 MG tablet TAKE 1 TABLET(4 MG) BY MOUTH EVERY 12 HOURS AS NEEDED FOR MUSCLE SPASMS 30 tablet 0   triamcinolone cream (KENALOG) 0.1 % Apply 1 Application topically 2 (two) times daily. 30 g 1   No current facility-administered medications on file prior to visit.    Past Medical History:  Diagnosis Date    Anemia    Anxiety    Has tried Celexa and effexor, poor tolerance.   Cataract    bilateral -left > right   Clostridium difficile diarrhea    05-09-14 denies any problems at this time   COPD (chronic obstructive pulmonary disease) (HCC)    Depression    Fibromyalgia    History of diverticulosis    Hyperlipidemia    Statins aggravated myalgias:Pravastatin and Atorvastatin among some.   Hypertension    Hypothyroidism    IBS (irritable bowel syndrome)    Neuromuscular disorder (HCC)    fibromyalgia   Thyroid disease    Allergies  Allergen Reactions   Sulfa Antibiotics Other (See Comments) and Itching    Anal itching "anal itching"   Demerol  [Meperidine Hcl] Nausea And Vomiting   Levofloxacin In D5w Other (See Comments)    Yeast infections.  "Yeast infections"   Spironolactone Other (See Comments)    Weakness/malaise "Weakness" Other reaction(s): Other (See Comments) Weakness/malaise   Clindamycin     Other reaction(s): Other (See Comments)   Clindamycin/Lincomycin     CDIF   Erythromycin Diarrhea   Erythromycin Base     OTHER REACTION(S): Diarrhea Other reaction(s): Other (See Comments) OTHER REACTION(S): Diarrhea   Fluconazole Other (See Comments)   Hydrochlorothiazide Other (  See Comments)    Weakness/malaise Other reaction(s): Other (See Comments) Weakness/malaise   Lincomycin Diarrhea    CDIF   Meperidine Nausea And Vomiting, Other (See Comments) and Hypertension    Severe headache OTHER REACTION(S): Hypertension Severe headache   Ciprofloxacin Diarrhea   Levofloxacin Nausea Only    Yeast infection OTHER REACTION(S): Diarrhea Other reaction(s): Other (See Comments) Yeast infection   Metronidazole Nausea Only    OTHER REACTION(S): Diarrhea OTHER REACTION(S): Diarrhea    Other Itching    Social History   Socioeconomic History   Marital status: Widowed    Spouse name: Avika Bartl   Number of children: Not on file   Years of education: Not on  file   Highest education level: Not on file  Occupational History   Not on file  Tobacco Use   Smoking status: Former    Packs/day: 1.50    Years: 25.00    Additional pack years: 0.00    Total pack years: 37.50    Types: Cigarettes    Quit date: 05/09/1980    Years since quitting: 42.5   Smokeless tobacco: Never  Vaping Use   Vaping Use: Never used  Substance and Sexual Activity   Alcohol use: No    Alcohol/week: 0.0 standard drinks of alcohol   Drug use: No   Sexual activity: Not Currently  Other Topics Concern   Not on file  Social History Narrative   Not on file   Social Determinants of Health   Financial Resource Strain: High Risk (09/02/2021)   Overall Financial Resource Strain (CARDIA)    Difficulty of Paying Living Expenses: Hard  Food Insecurity: No Food Insecurity (05/16/2022)   Hunger Vital Sign    Worried About Running Out of Food in the Last Year: Never true    Ran Out of Food in the Last Year: Never true  Transportation Needs: No Transportation Needs (05/16/2022)   PRAPARE - Hydrologist (Medical): No    Lack of Transportation (Non-Medical): No  Physical Activity: Unknown (09/02/2021)   Exercise Vital Sign    Days of Exercise per Week: Patient declined    Minutes of Exercise per Session: Not on file  Recent Concern: Physical Activity - Inactive (08/30/2021)   Exercise Vital Sign    Days of Exercise per Week: 0 days    Minutes of Exercise per Session: 0 min  Stress: Stress Concern Present (08/30/2021)   [place]    Feeling of Stress : To some extent  Social Connections: Moderately Integrated (06/23/2020)   Social Connection and Isolation Panel [NHANES]    Frequency of Communication with Friends and Family: Three times a week    Frequency of Social Gatherings with Friends and Family: Once a week    Attends Religious Services: More than 4 times per year    Active  Member of [employer] or Organizations: No    Attends Archivist Meetings: Never    Marital Status: Married    There were no vitals filed for this visit. There is no height or weight on file to calculate BMI.  Physical Exam  ASSESSMENT AND PLAN: There are no diagnoses linked to this encounter.  No follow-ups on file.  Wanya Bangura G. [place], MD  [facility]. [place] office.  Discharge Instructions   None

## 2022-11-02 ENCOUNTER — Encounter: Payer: Self-pay | Admitting: Family Medicine

## 2022-11-02 ENCOUNTER — Ambulatory Visit (INDEPENDENT_AMBULATORY_CARE_PROVIDER_SITE_OTHER): Payer: Medicare Other | Admitting: Family Medicine

## 2022-11-02 VITALS — BP 136/80 | HR 85 | Resp 16 | Ht 67.0 in | Wt 106.0 lb

## 2022-11-02 DIAGNOSIS — R42 Dizziness and giddiness: Secondary | ICD-10-CM

## 2022-11-02 DIAGNOSIS — R64 Cachexia: Secondary | ICD-10-CM

## 2022-11-02 DIAGNOSIS — M8448XG Pathological fracture, other site, subsequent encounter for fracture with delayed healing: Secondary | ICD-10-CM | POA: Insufficient documentation

## 2022-11-02 DIAGNOSIS — I10 Essential (primary) hypertension: Secondary | ICD-10-CM

## 2022-11-02 DIAGNOSIS — K581 Irritable bowel syndrome with constipation: Secondary | ICD-10-CM

## 2022-11-02 DIAGNOSIS — I160 Hypertensive urgency: Secondary | ICD-10-CM

## 2022-11-02 MED ORDER — LOSARTAN POTASSIUM 100 MG PO TABS: ORAL_TABLET | ORAL | 3 refills | Status: DC

## 2022-11-02 MED ORDER — HYDRALAZINE HCL 10 MG PO TABS: 10.0000 mg | ORAL_TABLET | Freq: Three times a day (TID) | ORAL | 1 refills | Status: DC

## 2022-11-02 NOTE — Patient Instructions (Addendum)
A few things to remember from today's visit:  Sacral insufficiency fracture with delayed healing - Plan: Ambulatory referral to Orthopedics  Hypertensive urgency - Plan: hydrALAZINE (APRESOLINE) 10 MG tablet  Stop Amlodipine. Start Hydralazine 10 mg 3 times per day. Continue Losartan 100 mg daily.  If you need refills for medications you take chronically, please call your pharmacy. Do not use My Chart to request refills or for acute issues that need immediate attention. If you send a my chart message, it may take a few days to be addressed, specially if I am not in the office.  Please be sure medication list is accurate. If a new problem present, please set up appointment sooner than planned today.

## 2022-11-03 ENCOUNTER — Encounter: Payer: Self-pay | Admitting: Family Medicine

## 2022-11-03 NOTE — Assessment & Plan Note (Signed)
She was already evaluated by ortho, MRI done,and sacroplasty recommended. She would like a provider closer home, requesting a new referral. Her son, who takes her to appts, will not be able to take her to surgical center recommend ed for surgical procedure.

## 2022-11-03 NOTE — Assessment & Plan Note (Signed)
BP today adequately controlled. She thinks amlodipine is causing some side effects (nasal congestion), so discontinue. Continue Losartan 100 mg daily. She has taken Hydralazine before with no problems, so resume Hydralazine 10 mg tid. Continue low salt diet. Monitoring BP at home exacerbates anxiety. F/U in 2 months.

## 2022-11-03 NOTE — Assessment & Plan Note (Signed)
Wt otherwise stable since her last visit. We discussed possible complications of being underwt. Continue high calorie diet, protein snacks in between meals. F/U in 2 months.

## 2022-11-03 NOTE — Assessment & Plan Note (Signed)
Problem has worsened through the past few years. Pending appt with GI. Continue adequate fiber and fluid intake. Recommend try Miralax, she can adjust dose by 50% if it causes diarrhea. Instructed about warning signs.

## 2022-11-03 NOTE — Assessment & Plan Note (Signed)
We discussed possible etiologies. Some of her comorbilities and medications can be contributing factors. This problem is chronic and stable. Fall precautions discussed. She can continue meclizine 25 mg 1/2 to 1 tablet daily as needed but with caution. Monitor for new symptoms.

## 2022-11-04 ENCOUNTER — Other Ambulatory Visit: Payer: Self-pay

## 2022-11-04 DIAGNOSIS — M8448XG Pathological fracture, other site, subsequent encounter for fracture with delayed healing: Secondary | ICD-10-CM

## 2022-11-23 ENCOUNTER — Telehealth: Payer: Self-pay | Admitting: Family Medicine

## 2022-11-23 DIAGNOSIS — M8448XG Pathological fracture, other site, subsequent encounter for fracture with delayed healing: Secondary | ICD-10-CM

## 2022-11-23 NOTE — Telephone Encounter (Signed)
[facility]  [address], [address]  [place], [place] [address]  Phone: [phone] Fax: [phone]  Please fax over to them.

## 2022-11-23 NOTE — Telephone Encounter (Signed)
Patient states that Megan Glover needs someone to fax over the referral number to their office so the patient can be scheduled.  She said they haven't scheduled her for the visit for her referral from March yet.  She would like to see Dr. Petra Kuba.  Patient would like a call back.

## 2022-11-24 ENCOUNTER — Other Ambulatory Visit: Payer: Self-pay | Admitting: *Deleted

## 2022-11-24 DIAGNOSIS — I739 Peripheral vascular disease, unspecified: Secondary | ICD-10-CM

## 2022-11-24 NOTE — Telephone Encounter (Signed)
Pt called to say Smitty Cords is asking that we resend the referral because it is missing the referral number??

## 2022-11-25 NOTE — Telephone Encounter (Signed)
Pt called to say she called [facility] yesterday and was told they are still waiting for this information.  Pt is asking that info be sent ASAP, as she is in a lot of pain and really needs to be seen by them, sooner rather than later.

## 2022-11-25 NOTE — Telephone Encounter (Signed)
Referral, demographics, insurance & OV note faxed.

## 2022-11-28 ENCOUNTER — Ambulatory Visit (HOSPITAL_COMMUNITY): Admission: RE | Admit: 2022-11-28 | Payer: Medicare Other | Source: Ambulatory Visit

## 2022-11-29 NOTE — Telephone Encounter (Signed)
Pt is calling back checking on the status of the referral again

## 2022-12-02 NOTE — Telephone Encounter (Signed)
Pt called to FU on this referral request.  Pt is asking for a call back to discuss.

## 2022-12-02 NOTE — Addendum Note (Signed)
Addended by: Weyman Croon E on: 12/02/2022 11:51 AM   Modules accepted: Orders

## 2022-12-02 NOTE — Telephone Encounter (Signed)
Faxed everything x 2 to [facility]. Patient is aware.

## 2022-12-06 ENCOUNTER — Other Ambulatory Visit: Payer: Self-pay

## 2022-12-06 DIAGNOSIS — M8448XG Pathological fracture, other site, subsequent encounter for fracture with delayed healing: Secondary | ICD-10-CM

## 2022-12-06 NOTE — Telephone Encounter (Signed)
Pt called stating the facility she was referred to does not perform the procedure she needs. Says she is willing to go to somewhere affiliated with [facility]

## 2022-12-06 NOTE — Telephone Encounter (Signed)
Patient called back, she will follow up with [facility] - Dr. Marcy Siren office.

## 2022-12-06 NOTE — Addendum Note (Signed)
Addended by: Kathreen Devoid on: 12/06/2022 03:51 PM   Modules accepted: Orders

## 2022-12-06 NOTE — Telephone Encounter (Signed)
I tried to contact patient, but her mailbox is not set up.  No other providers in [place] perform the procedure she needs. She will have to follow up with Dr. Marcy Siren office.

## 2022-12-27 ENCOUNTER — Other Ambulatory Visit: Payer: Self-pay | Admitting: Family Medicine

## 2022-12-27 DIAGNOSIS — R42 Dizziness and giddiness: Secondary | ICD-10-CM

## 2022-12-27 DIAGNOSIS — J441 Chronic obstructive pulmonary disease with (acute) exacerbation: Secondary | ICD-10-CM

## 2022-12-29 NOTE — Progress Notes (Signed)
This encounter was created in error - please disregard.

## 2023-01-05 ENCOUNTER — Ambulatory Visit: Payer: Medicare Other | Admitting: Vascular Surgery

## 2023-01-12 ENCOUNTER — Other Ambulatory Visit (HOSPITAL_COMMUNITY): Payer: Self-pay | Admitting: Neuroradiology

## 2023-01-12 DIAGNOSIS — M8448XA Pathological fracture, other site, initial encounter for fracture: Secondary | ICD-10-CM

## 2023-01-13 ENCOUNTER — Other Ambulatory Visit: Payer: Self-pay | Admitting: Family Medicine

## 2023-01-13 DIAGNOSIS — L298 Other pruritus: Secondary | ICD-10-CM

## 2023-01-16 ENCOUNTER — Telehealth: Payer: Self-pay | Admitting: Family Medicine

## 2023-01-16 NOTE — Telephone Encounter (Signed)
Requesting meds for plaque psoriasis. Says she was seen previously and declined OV

## 2023-01-16 NOTE — Telephone Encounter (Signed)
Rx was refilled this morning

## 2023-01-17 ENCOUNTER — Ambulatory Visit (HOSPITAL_COMMUNITY)
Admission: RE | Admit: 2023-01-17 | Discharge: 2023-01-17 | Disposition: A | Discharge status: Home / Self Care | Payer: Medicare Other | Source: Ambulatory Visit | Attending: Neuroradiology | Admitting: Neuroradiology

## 2023-01-17 DIAGNOSIS — M8448XA Pathological fracture, other site, initial encounter for fracture: Secondary | ICD-10-CM

## 2023-01-17 NOTE — Consult Note (Signed)
Chief Complaint: Patient was seen in consultation today for sacral insufficiency fracture.  Referring Physician(s): Autumn Patty  Supervising Physician: Baldemar Lenis  Patient Status: [facility] - Out-pt  History of Present Illness: [name] is a 77 year old female with past medical history significant for PAD, HTN,peripheral neuropathy, chronic pain, anxiety, and hypothyroidism.  She has been having lower back pain since January.  MRI of the lumbar spine showed bilateral sacral insufficiency fractures.  Pain has not improved with conservative management.  The pain is worse when moving (8-9/10).  She also complains of right hip pain which she informs is being evaluated by orthopedics.  Denies other concerns.    Past Medical History:  Diagnosis Date   Anemia    Anxiety    Has tried Celexa and effexor, poor tolerance.   Cataract    bilateral -left > right   Clostridium difficile diarrhea    05-09-14 denies any problems at this time   COPD (chronic obstructive pulmonary disease) (HCC)    Depression    Fibromyalgia    History of diverticulosis    Hyperlipidemia    Statins aggravated myalgias:Pravastatin and Atorvastatin among some.   Hypertension    Hypothyroidism    IBS (irritable bowel syndrome)    Neuromuscular disorder (HCC)    fibromyalgia   Thyroid disease     Past Surgical History:  Procedure Laterality Date   Cataract removal 2015 Bilateral    CLAVICLE SURGERY Left    September 2022   DILATION AND CURETTAGE OF UTERUS     FRACTURE SURGERY     Right femoral fx 02/22/14   INGUINAL HERNIA REPAIR Bilateral 05/16/2014   Procedure: LAPAROSCOPIC EXPLORATION AND REPAIR OF BILATERAL FEMEROL AND BILATERAL INGUINAL HERNIAS  WITH MESH;  Surgeon: Karie Soda, MD;  Location: [facility] ORS;  Service: General;  Laterality: Bilateral;   INTRAMEDULLARY (IM) NAIL INTERTROCHANTERIC Right 02/21/2014   Procedure: INTRAMEDULLARY (IM) NAIL INTERTROCHANTRIC;   Surgeon: Eulas Post, MD;  Location: [facility] OR;  Service: Orthopedics;  Laterality: Right;    Allergies: Sulfa antibiotics, Demerol  [meperidine hcl], Levofloxacin in d5w, Spironolactone, Clindamycin, Clindamycin/lincomycin, Erythromycin, Erythromycin base, Fluconazole, Hydrochlorothiazide, Lincomycin, Meperidine, Ciprofloxacin, Levofloxacin, Metronidazole, and Other  Medications: Prior to Admission medications   Medication Sig Start Date End Date Taking? Authorizing Provider  acetaminophen (TYLENOL) 500 MG tablet Take 1,000 mg by mouth every 8 (eight) hours as needed for moderate pain.    [provider]  albuterol (VENTOLIN HFA) 108 (90 Base) MCG/ACT inhaler INHALE 2 PUFFS INTO THE LUNGS EVERY 4 HOURS AS NEEDED 12/27/22   [place], Betty G, MD  aspirin 81 MG chewable tablet Chew 81 mg by mouth.    [provider]  Biotin 1000 MCG tablet Take by mouth.    [provider]  Cholecalciferol (VITAMIN D3) 2000 units TABS Take 1 tablet by mouth daily.    [provider]  clonazePAM (KLONOPIN) 0.5 MG tablet TAKE 1 TABLET(0.5 MG) BY MOUTH TWICE DAILY AS NEEDED FOR ANXIETY 10/03/22   [place], Betty G, MD  hydrALAZINE (APRESOLINE) 10 MG tablet Take 1 tablet (10 mg total) by mouth 3 (three) times daily. 11/02/22   [place], Betty G, MD  levothyroxine (SYNTHROID) 50 MCG tablet TAKE 1 TABLET(50 MCG) BY MOUTH DAILY BEFORE BREAKFAST 07/11/22   [place], Betty G, MD  lidocaine (XYLOCAINE) 5 % ointment Apply 1 application topically as needed. 03/05/21   [place], Betty G, MD  losartan (COZAAR) 100 MG tablet TAKE 1 TABLET(100 MG) BY  MOUTH EVERY MORNING 11/02/22   [place], Betty G, MD  meclizine (ANTIVERT) 25 MG tablet TAKE 1/2 TO 1 TABLET(12.5 TO 25 MG) BY MOUTH DAILY AS NEEDED FOR DIZZINESS 12/28/22   [place], Betty G, MD  OVER THE COUNTER MEDICATION Apply 2 drops to eye at bedtime as needed (for itchy dry eyes.).    [provider]  PROCTOZONE-HC 2.5 % rectal cream USE RECTALLY  EVERY DAY AS NEEDED 12/13/19   [place], Betty G, MD  rosuvastatin (CRESTOR) 10 MG tablet Take 1 tablet (10 mg total) by mouth daily. 07/05/21   [place], Betty G, MD  tiZANidine (ZANAFLEX) 4 MG tablet TAKE 1 TABLET(4 MG) BY MOUTH EVERY 12 HOURS AS NEEDED FOR MUSCLE SPASMS 06/28/21   [place], Betty G, MD  triamcinolone cream (KENALOG) 0.1 % APPLY TOPICALLY TO THE AFFECTED AREA TWICE DAILY 01/16/23   [place], Betty G, MD     Family History  Problem Relation Age of Onset   Breast cancer Mother        breast CA   Diabetes Father    Hypertension Father    Colon cancer Other    Breast cancer Sister    Breast cancer Sister    Cancer Sister        breast   Cancer Sister        breast    Social History   Socioeconomic History   Marital status: Widowed    Spouse name: Shalanda Rifkin   Number of children: Not on file   Years of education: Not on file   Highest education level: Not on file  Occupational History   Not on file  Tobacco Use   Smoking status: Former    Packs/day: 1.50    Years: 25.00    Additional pack years: 0.00    Total pack years: 37.50    Types: Cigarettes    Quit date: 05/09/1980    Years since quitting: 42.7   Smokeless tobacco: Never  Vaping Use   Vaping Use: Never used  Substance and Sexual Activity   Alcohol use: No    Alcohol/week: 0.0 standard drinks of alcohol   Drug use: No   Sexual activity: Not Currently  Other Topics Concern   Not on file  Social History Narrative   Not on file   Social Determinants of Health   Financial Resource Strain: High Risk (09/02/2021)   Overall Financial Resource Strain (CARDIA)    Difficulty of Paying Living Expenses: Hard  Food Insecurity: No Food Insecurity (05/16/2022)   Hunger Vital Sign    Worried About Running Out of Food in the Last Year: Never true    Ran Out of Food in the Last Year: Never true  Transportation Needs: No Transportation Needs (05/16/2022)   PRAPARE - Administrator, Civil Service  (Medical): No    Lack of Transportation (Non-Medical): No  Physical Activity: Unknown (09/02/2021)   Exercise Vital Sign    Days of Exercise per Week: Patient declined    Minutes of Exercise per Session: Not on file  Recent Concern: Physical Activity - Inactive (08/30/2021)   Exercise Vital Sign    Days of Exercise per Week: 0 days    Minutes of Exercise per Session: 0 min  Stress: Stress Concern Present (08/30/2021)   [employer] of Occupational Health - Occupational Stress Questionnaire    Feeling of Stress : To some extent  Social Connections: Moderately Integrated (06/23/2020)   Social Connection and Isolation Panel [NHANES]  Frequency of Communication with Friends and Family: Three times a week    Frequency of Social Gatherings with Friends and Family: Once a week    Attends Religious Services: More than 4 times per year    Active Member of [employer] or Organizations: No    Attends Banker Meetings: Never    Marital Status: Married     Review of Systems: A 12 point ROS discussed and pertinent positives are indicated in the HPI above.  All other systems are negative.  Review of Systems  Vital Signs: There were no vitals taken for this visit.  Physical Exam Constitutional:      Appearance: She is well-groomed and underweight.  HENT:     Head: Normocephalic and atraumatic.  Musculoskeletal:       Back:  Skin:    General: Skin is warm and dry.  Neurological:     Mental Status: She is alert and oriented to person, place, and time.          Imaging: MRI of the lumbar spine (09/08/2022):  Acute or subacute bilateral sacral insufficiency fractures with confluent sacral edema.    Labs:  CBC: Recent Labs    08/11/22 2004  WBC 9.0  HGB 12.1  HCT 35.3*  PLT 270    COAGS: No results for input(s): "INR", "APTT" in the last 8760 hours.  BMP: Recent Labs    08/11/22 2004  NA 133*  K 4.5  CL 98  CO2 27  GLUCOSE 110*  BUN 28*  CALCIUM 9.7   CREATININE 0.65  GFRNONAA >60    LIVER FUNCTION TESTS: No results for input(s): "BILITOT", "AST", "ALT", "ALKPHOS", "PROT", "ALBUMIN" in the last 8760 hours.  TUMOR MARKERS: No results for input(s): "AFPTM", "CEA", "CA199", "CHROMGRNA" in the last 8760 hours.  Assessment and Plan:  I spoke to Megan Glover and her son Onalee Hua.  I reviewed the imaging findings and treatment options.  Given her persistent sacral pain, she would like to undergo sacroplasty under moderate sedation.  Risks and benefits of treatment were discussed.  We also discussed the multifactorial nature of back pain and that we are only addressing the sacral fractures.  They asked appropriate questions.  Our scheduler will reach out to them to book the procedure.     ** The patient has suffered a fracture of the vertebral column. It is recommended that patients aged 61 years or older be evaluated for possible testing or treatment of osteoporosis.  Thank you for this interesting consult.  I greatly enjoyed meeting Megan Glover and look forward to participating in her care.  A copy of this report was sent to the requesting provider on this date.  Electronically Signed: Baldemar Lenis, MD 01/17/2023, 2:54 PM   I spent a total of  30 Minutes   in face to face in clinical consultation, greater than 50% of which was counseling/coordinating care for sacral insufficiency fractures.

## 2023-01-18 ENCOUNTER — Telehealth: Payer: Self-pay | Admitting: Family Medicine

## 2023-01-18 DIAGNOSIS — L409 Psoriasis, unspecified: Secondary | ICD-10-CM

## 2023-01-18 NOTE — Telephone Encounter (Signed)
Pt's son called in and stated his mother, pt, is requesting a different prescription for her plaque psoriasis. He states that a cream was called in for her, however she does not want to take that and would like a call from the careteam to discuss other options.

## 2023-01-23 ENCOUNTER — Other Ambulatory Visit (HOSPITAL_COMMUNITY): Payer: Self-pay | Admitting: Neuroradiology

## 2023-01-23 DIAGNOSIS — M8448XA Pathological fracture, other site, initial encounter for fracture: Secondary | ICD-10-CM

## 2023-01-23 NOTE — Telephone Encounter (Signed)
I called and spoke with patient's son. Patient has seen commercials on TV for different medications that can be used for plaque psoriasis. She is wanting to know if she could be a candidate for one of these? They are aware PCP returned today and I would give them a call back once I had a response. (I know you mentioned derm referral - but not sure what the wait time is.)

## 2023-01-24 NOTE — Telephone Encounter (Signed)
I spoke with patient's son. Referral to dermatology placed and his phone number added to referral to be called for scheduling.

## 2023-01-24 NOTE — Telephone Encounter (Signed)
Recommend establishing with dermatologist to discuss psoriasis and treatment options. Thanks, BJ

## 2023-01-30 ENCOUNTER — Other Ambulatory Visit: Payer: Self-pay | Admitting: Student

## 2023-01-30 DIAGNOSIS — M8448XG Pathological fracture, other site, subsequent encounter for fracture with delayed healing: Secondary | ICD-10-CM

## 2023-01-31 ENCOUNTER — Ambulatory Visit (HOSPITAL_COMMUNITY)
Admission: RE | Admit: 2023-01-31 | Discharge: 2023-01-31 | Disposition: A | Discharge status: Home / Self Care | Payer: Medicare Other | Source: Ambulatory Visit | Attending: Neuroradiology | Admitting: Neuroradiology

## 2023-01-31 ENCOUNTER — Other Ambulatory Visit: Payer: Self-pay

## 2023-01-31 ENCOUNTER — Encounter (HOSPITAL_COMMUNITY): Payer: Self-pay

## 2023-01-31 DIAGNOSIS — E039 Hypothyroidism, unspecified: Secondary | ICD-10-CM | POA: Diagnosis not present

## 2023-01-31 DIAGNOSIS — I10 Essential (primary) hypertension: Secondary | ICD-10-CM | POA: Insufficient documentation

## 2023-01-31 DIAGNOSIS — Z87891 Personal history of nicotine dependence: Secondary | ICD-10-CM | POA: Diagnosis not present

## 2023-01-31 DIAGNOSIS — G8929 Other chronic pain: Secondary | ICD-10-CM | POA: Diagnosis not present

## 2023-01-31 DIAGNOSIS — I739 Peripheral vascular disease, unspecified: Secondary | ICD-10-CM | POA: Insufficient documentation

## 2023-01-31 DIAGNOSIS — G629 Polyneuropathy, unspecified: Secondary | ICD-10-CM | POA: Diagnosis not present

## 2023-01-31 DIAGNOSIS — M8448XA Pathological fracture, other site, initial encounter for fracture: Secondary | ICD-10-CM | POA: Insufficient documentation

## 2023-01-31 DIAGNOSIS — M8448XG Pathological fracture, other site, subsequent encounter for fracture with delayed healing: Secondary | ICD-10-CM

## 2023-01-31 HISTORY — PX: IR SACROPLASTY BILATERAL: IMG5561

## 2023-01-31 LAB — CBC
HCT: 35.8 % — ABNORMAL LOW (ref 36.0–46.0)
Hemoglobin: 12.3 g/dL (ref 12.0–15.0)
MCH: 31.5 pg (ref 26.0–34.0)
MCHC: 34.4 g/dL (ref 30.0–36.0)
MCV: 91.8 fL (ref 80.0–100.0)
Platelets: 262 10*3/uL (ref 150–400)
RBC: 3.9 MIL/uL (ref 3.87–5.11)
RDW: 12.5 % (ref 11.5–15.5)
WBC: 6.5 10*3/uL (ref 4.0–10.5)
nRBC: 0 % (ref 0.0–0.2)

## 2023-01-31 LAB — PROTIME-INR
INR: 1.1 (ref 0.8–1.2)
Prothrombin Time: 13.9 seconds (ref 11.4–15.2)

## 2023-01-31 MED ORDER — LORAZEPAM 2 MG/ML IJ SOLN
INTRAMUSCULAR | Status: AC | PRN
  Administered 2023-01-31: .5 mg via INTRAVENOUS

## 2023-01-31 MED ORDER — BUPIVACAINE HCL (PF) 0.5 % IJ SOLN
INTRAMUSCULAR | Status: AC
  Filled 2023-01-31: qty 30

## 2023-01-31 MED ORDER — CEFAZOLIN SODIUM-DEXTROSE 2-4 GM/100ML-% IV SOLN
INTRAVENOUS | Status: AC
  Filled 2023-01-31: qty 100

## 2023-01-31 MED ORDER — FENTANYL CITRATE (PF) 100 MCG/2ML IJ SOLN
INTRAMUSCULAR | Status: AC
  Filled 2023-01-31: qty 2

## 2023-01-31 MED ORDER — LIDOCAINE HCL (PF) 1 % IJ SOLN
INTRAMUSCULAR | Status: AC
  Filled 2023-01-31: qty 30

## 2023-01-31 MED ORDER — ACETAMINOPHEN 325 MG PO TABS: 650.0000 mg | ORAL_TABLET | Freq: Four times a day (QID) | ORAL | Status: DC | PRN

## 2023-01-31 MED ORDER — SODIUM CHLORIDE 0.9 % IV SOLN: INTRAVENOUS | Status: DC

## 2023-01-31 MED ORDER — LIDOCAINE HCL 1 % IJ SOLN
INTRAMUSCULAR | Status: AC
  Filled 2023-01-31: qty 20

## 2023-01-31 MED ORDER — FENTANYL CITRATE (PF) 100 MCG/2ML IJ SOLN
INTRAMUSCULAR | Status: AC | PRN
  Administered 2023-01-31: 50 ug via INTRAVENOUS
  Administered 2023-01-31 (×2): 25 ug via INTRAVENOUS

## 2023-01-31 MED ORDER — MIDAZOLAM HCL 2 MG/2ML IJ SOLN
INTRAMUSCULAR | Status: AC
  Filled 2023-01-31: qty 2

## 2023-01-31 MED ORDER — CEFAZOLIN SODIUM-DEXTROSE 2-4 GM/100ML-% IV SOLN
2.0000 g | INTRAVENOUS | Status: AC
  Administered 2023-01-31: 2 g via INTRAVENOUS

## 2023-01-31 MED ORDER — MIDAZOLAM HCL 2 MG/2ML IJ SOLN
INTRAMUSCULAR | Status: AC | PRN
  Administered 2023-01-31: .5 mg via INTRAVENOUS
  Administered 2023-01-31: 1 mg via INTRAVENOUS

## 2023-01-31 NOTE — Progress Notes (Signed)
Patient elevated 45 degree with ice applied  to lower back.

## 2023-01-31 NOTE — H&P (Signed)
Chief Complaint: Patient was seen in consultation today for Bilateral sacroplasy at the request of Dr Prince Solian  Supervising MD:  Dr dr Melchor Amour  Patient Status: [facility] - Out-pt  History of Present Illness: Megan Glover is a 77 y.o. female   FULL Code status per pt and son  PAD; HTN; peripheral neuropathy; chronic pain; hypothyroid Pt with onset low back pain since Jan 2024- denies injury MRI Jan 2024: 1. Acute or subacute bilateral sacral insufficiency fractures with confluent sacral edema. 2. Marrow edema also in the bilateral L5 pedicles favored to be stress reaction secondary to #1. 3. Superimposed lumbar spine degeneration with moderate multifactorial spinal stenosis at L3-L4 > L2-L3. Lower lumbar facet arthropathy, with a degenerative synovial cyst projecting into the left L4 neural foramen contributing to severe foraminal stenosis there.  Pain meds and treatments without relief  Consultaion with Dr Quay Burow 01/17/23: I spoke to Megan Glover and her son Megan Glover. I reviewed the imaging findings and treatment options. Given her persistent sacral pain, she would like to undergo sacroplasty under moderate sedation. Risks and benefits of treatment were discussed. We also discussed the multifactorial nature of back pain and that we are only addressing the sacral fractures. They asked appropriate questions. Our scheduler will reach out to them to book the procedure.   Scheduled now for Bilateral sacroplasty in IR   Past Medical History:  Diagnosis Date   Anemia    Anxiety    Has tried Celexa and effexor, poor tolerance.   Cataract    bilateral -left > right   Clostridium difficile diarrhea    05-09-14 denies any problems at this time   COPD (chronic obstructive pulmonary disease) (HCC)    Depression    Fibromyalgia    History of diverticulosis    Hyperlipidemia    Statins aggravated myalgias:Pravastatin and Atorvastatin among some.   Hypertension     Hypothyroidism    IBS (irritable bowel syndrome)    Neuromuscular disorder (HCC)    fibromyalgia   Thyroid disease     Past Surgical History:  Procedure Laterality Date   Cataract removal 2015 Bilateral    CLAVICLE SURGERY Left    September 2022   DILATION AND CURETTAGE OF UTERUS     FRACTURE SURGERY     Right femoral fx 02/22/14   INGUINAL HERNIA REPAIR Bilateral 05/16/2014   Procedure: LAPAROSCOPIC EXPLORATION AND REPAIR OF BILATERAL FEMEROL AND BILATERAL INGUINAL HERNIAS  WITH MESH;  Surgeon: Karie Soda, MD;  Location: [facility] OR[facility];  Service: General;  Laterality: Bilateral;   INTRAMEDULLARY (IM) NAIL INTERTROCHANTERIC Right 02/21/2014   Procedure: INTRAMEDULLARY (IM) NAIL INTERTROCHANTRIC;  Surgeon: Eulas Post, MD;  Location: [facility] OR;  Service: Orthopedics;  Laterality: Right;    Allergies: Sulfa antibiotics, Demerol  [meperidine hcl], Levofloxacin in d5w, Spironolactone, Clindamycin, Clindamycin/lincomycin, Erythromycin, Erythromycin base, Fluconazole, Hydrochlorothiazide, Lincomycin, Meperidine, Ciprofloxacin, Levofloxacin, Metronidazole, and Other  Medications: Prior to Admission medications   Medication Sig Start Date End Date Taking? Authorizing Provider  acetaminophen (TYLENOL) 500 MG tablet Take 1,000 mg by mouth every 8 (eight) hours as needed for moderate pain.    [provider]  albuterol (VENTOLIN HFA) 108 (90 Base) MCG/ACT inhaler INHALE 2 PUFFS INTO THE LUNGS EVERY 4 HOURS AS NEEDED 12/27/22   [place], Betty G, MD  aspirin 81 MG chewable tablet Chew 81 mg by mouth.    [provider]  Biotin 1000 MCG tablet Take by mouth.    [provider]  Cholecalciferol (  VITAMIN D3) 2000 units TABS Take 1 tablet by mouth daily.    [provider]  clonazePAM (KLONOPIN) 0.5 MG tablet TAKE 1 TABLET(0.5 MG) BY MOUTH TWICE DAILY AS NEEDED FOR ANXIETY 10/03/22   [place], Betty G, MD  hydrALAZINE (APRESOLINE) 10 MG tablet Take 1 tablet (10 mg total) by  mouth 3 (three) times daily. 11/02/22   [place], Betty G, MD  levothyroxine (SYNTHROID) 50 MCG tablet TAKE 1 TABLET(50 MCG) BY MOUTH DAILY BEFORE BREAKFAST 07/11/22   [place], Betty G, MD  lidocaine (XYLOCAINE) 5 % ointment Apply 1 application topically as needed. 03/05/21   [place], Betty G, MD  losartan (COZAAR) 100 MG tablet TAKE 1 TABLET(100 MG) BY MOUTH EVERY MORNING 11/02/22   [place], Betty G, MD  meclizine (ANTIVERT) 25 MG tablet TAKE 1/2 TO 1 TABLET(12.5 TO 25 MG) BY MOUTH DAILY AS NEEDED FOR DIZZINESS 12/28/22   [place], Betty G, MD  OVER THE COUNTER MEDICATION Apply 2 drops to eye at bedtime as needed (for itchy dry eyes.).    [provider]  PROCTOZONE-HC 2.5 % rectal cream USE RECTALLY EVERY DAY AS NEEDED 12/13/19   [place], Betty G, MD  rosuvastatin (CRESTOR) 10 MG tablet Take 1 tablet (10 mg total) by mouth daily. 07/05/21   [place], Betty G, MD  tiZANidine (ZANAFLEX) 4 MG tablet TAKE 1 TABLET(4 MG) BY MOUTH EVERY 12 HOURS AS NEEDED FOR MUSCLE SPASMS 06/28/21   [place], Betty G, MD  triamcinolone cream (KENALOG) 0.1 % APPLY TOPICALLY TO THE AFFECTED AREA TWICE DAILY 01/16/23   [place], Betty G, MD     Family History  Problem Relation Age of Onset   Breast cancer Mother        breast CA   Diabetes Father    Hypertension Father    Colon cancer Other    Breast cancer Sister    Breast cancer Sister    Cancer Sister        breast   Cancer Sister        breast    Social History   Socioeconomic History   Marital status: Widowed    Spouse name: Megan Glover   Number of children: Not on file   Years of education: Not on file   Highest education level: Not on file  Occupational History   Not on file  Tobacco Use   Smoking status: Former    Packs/day: 1.50    Years: 25.00    Additional pack years: 0.00    Total pack years: 37.50    Types: Cigarettes    Quit date: 05/09/1980    Years since quitting: 42.7   Smokeless tobacco: Never  Vaping Use   Vaping Use: Never used   Substance and Sexual Activity   Alcohol use: No    Alcohol/week: 0.0 standard drinks of alcohol   Drug use: No   Sexual activity: Not Currently  Other Topics Concern   Not on file  Social History Narrative   Not on file   Social Determinants of Health   Financial Resource Strain: High Risk (09/02/2021)   Overall Financial Resource Strain (CARDIA)    Difficulty of Paying Living Expenses: Hard  Food Insecurity: No Food Insecurity (05/16/2022)   Hunger Vital Sign    Worried About Running Out of Food in the Last Year: Never true    Ran Out of Food in the Last Year: Never true  Transportation Needs: No Transportation Needs (05/16/2022)   PRAPARE - Transportation  Lack of Transportation (Medical): No    Lack of Transportation (Non-Medical): No  Physical Activity: Unknown (09/02/2021)   Exercise Vital Sign    Days of Exercise per Week: Patient declined    Minutes of Exercise per Session: Not on file  Recent Concern: Physical Activity - Inactive (08/30/2021)   Exercise Vital Sign    Days of Exercise per Week: 0 days    Minutes of Exercise per Session: 0 min  Stress: Stress Concern Present (08/30/2021)   [employer] of Occupational Health - Occupational Stress Questionnaire    Feeling of Stress : To some extent  Social Connections: Moderately Integrated (06/23/2020)   Social Connection and Isolation Panel [NHANES]    Frequency of Communication with Friends and Family: Three times a week    Frequency of Social Gatherings with Friends and Family: Once a week    Attends Religious Services: More than 4 times per year    Active Member of [employer] or Organizations: No    Attends Banker Meetings: Never    Marital Status: Married    Review of Systems: A 12 point ROS discussed and pertinent positives are indicated in the HPI above.  All other systems are negative.  Review of Systems  Constitutional:  Positive for activity change. Negative for fatigue and fever.   Respiratory:  Negative for cough and shortness of breath.   Cardiovascular:  Negative for chest pain.  Gastrointestinal:  Negative for abdominal pain.  Musculoskeletal:  Positive for back pain and gait problem.  Neurological:  Positive for weakness.  Psychiatric/Behavioral:  Negative for behavioral problems, confusion and decreased concentration.     Vital Signs: There were no vitals taken for this visit.  Advance Care Plan: The advanced care plan/surrogate decision maker was discussed at the time of visit and documented in the medical record.    Physical Exam Vitals reviewed.  Constitutional:      Comments: Thin/frail  HENT:     Mouth/Throat:     Mouth: Mucous membranes are moist.  Cardiovascular:     Rate and Rhythm: Normal rate and regular rhythm.     Heart sounds: Normal heart sounds.  Pulmonary:     Effort: Pulmonary effort is normal.     Breath sounds: Normal breath sounds.  Abdominal:     Palpations: Abdomen is soft.  Musculoskeletal:        General: Normal range of motion.     Comments: Low back pain  Skin:    General: Skin is warm.  Neurological:     Mental Status: She is alert and oriented to person, place, and time.  Psychiatric:        Behavior: Behavior normal.     Imaging: No results found.  Labs:  CBC: Recent Labs    08/11/22 2004  WBC 9.0  HGB 12.1  HCT 35.3*  PLT 270    COAGS: No results for input(s): "INR", "APTT" in the last 8760 hours.  BMP: Recent Labs    08/11/22 2004  NA 133*  K 4.5  CL 98  CO2 27  GLUCOSE 110*  BUN 28*  CALCIUM 9.7  CREATININE 0.65  GFRNONAA >60    LIVER FUNCTION TESTS: No results for input(s): "BILITOT", "AST", "ALT", "ALKPHOS", "PROT", "ALBUMIN" in the last 8760 hours.  TUMOR MARKERS: No results for input(s): "AFPTM", "CEA", "CA199", "CHROMGRNA" in the last 8760 hours.  Assessment and Plan:  Scheduled for Bilateral sacroplasty in IR  Risks and benefits of Bilateral ascroplasty were  discussed with the patient including, but not limited to education regarding the natural healing process of compression fractures without intervention, bleeding, infection, cement migration which may cause spinal cord damage, paralysis, pulmonary embolism or even death.  This interventional procedure involves the use of X-rays and because of the nature of the planned procedure, it is possible that we will have prolonged use of X-ray fluoroscopy.  Potential radiation risks to you include (but are not limited to) the following: - A slightly elevated risk for cancer  several years later in life. This risk is typically less than 0.5% percent. This risk is low in comparison to the normal incidence of human cancer, which is 33% for women and 50% for men according to the American Cancer Society. - Radiation induced injury can include skin redness, resembling a rash, tissue breakdown / ulcers and hair loss (which can be temporary or permanent).   The likelihood of either of these occurring depends on the difficulty of the procedure and whether you are sensitive to radiation due to previous procedures, disease, or genetic conditions.   IF your procedure requires a prolonged use of radiation, you will be notified and given written instructions for further action.  It is your responsibility to monitor the irradiated area for the 2 weeks following the procedure and to notify your physician if you are concerned that you have suffered a radiation induced injury.    All of the patient's questions were answered, patient is agreeable to proceed.  Consent signed and in chart.  Thank you for this interesting consult.  I greatly enjoyed meeting Megan Glover and look forward to participating in their care.  A copy of this report was sent to the requesting provider on this date.  Electronically Signed: Robet Leu, PA-C 01/31/2023, 7:18 AM   I spent a total of  30 Minutes   in face to face in  clinical consultation, greater than 50% of which was counseling/coordinating care for Bilateral sacroplasty

## 2023-01-31 NOTE — Procedures (Signed)
INTERVENTIONAL NEURORADIOLOGY BRIEF POSTPROCEDURE NOTE  FLUOROSCOPY GUIDED BILATERAL SACROPLASTY  Attending: Dr. Baldemar Lenis  Diagnosis: Bilateral insufficiency sacral fractures  Access site: Percutaneous  Anesthesia: Moderate sedation  Medication used: 2 mg Versed IV; 100 mcg Fentanyl IV.  Complications: None  Estimated blood loss: Negligible  Specimen: None  Bilateral longitudinal approach utilized for sacroplasty.   The patient tolerated the procedure well without incident or complication and is in stable condition.

## 2023-02-01 NOTE — Progress Notes (Signed)
Patient ID: Megan Glover, female   DOB: July 27, 1946, 77 y.o.   MRN: [id number]   PA called patient at the request of Dr. Tommie Sams to assess  back pain after sacroplasty yesterday.  Spoke with Onalee Hua, her son, who reports she is improved since yesterday's procedure and has not complained of back pain today.  Reports no concerns or questions related to procedure yesterday and is planning to move forward with additional MD appointments to address her additional/unrelated concerns.    Loyce Dys, MS RD PA-C

## 2023-02-20 ENCOUNTER — Other Ambulatory Visit: Payer: Self-pay | Admitting: Family Medicine

## 2023-02-20 DIAGNOSIS — F411 Generalized anxiety disorder: Secondary | ICD-10-CM

## 2023-02-20 NOTE — Telephone Encounter (Signed)
-   last OV 11/02/22 - last filled 01/13/23

## 2023-03-09 ENCOUNTER — Ambulatory Visit (INDEPENDENT_AMBULATORY_CARE_PROVIDER_SITE_OTHER): Payer: Medicare Other | Admitting: Vascular Surgery

## 2023-03-09 ENCOUNTER — Ambulatory Visit (HOSPITAL_COMMUNITY)
Admission: RE | Admit: 2023-03-09 | Discharge: 2023-03-09 | Disposition: A | Discharge status: Home / Self Care | Payer: Medicare Other | Source: Ambulatory Visit | Attending: Vascular Surgery | Admitting: Vascular Surgery

## 2023-03-09 ENCOUNTER — Encounter: Payer: Self-pay | Admitting: Vascular Surgery

## 2023-03-09 VITALS — BP 153/80 | HR 94 | Temp 98.1°F | Resp 20 | Ht 67.0 in | Wt 103.0 lb

## 2023-03-09 DIAGNOSIS — I7409 Other arterial embolism and thrombosis of abdominal aorta: Secondary | ICD-10-CM | POA: Diagnosis not present

## 2023-03-09 DIAGNOSIS — I739 Peripheral vascular disease, unspecified: Secondary | ICD-10-CM

## 2023-03-09 LAB — VAS US ABI WITH/WO TBI
Left ABI: 1.03
Right ABI: 0.59

## 2023-03-09 NOTE — Progress Notes (Signed)
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  However she has palpable femoral pulses although diminished.  Currently she is not having any claudication but her activity is fairly limited.  I have been following this for some time.  Her aorta is heavily calcified and she is 77 years old.  Therefore I would continue with a conservative approach unless she developed progressive ischemia.  I explained that I will be retiring.  I have ordered follow-up ABIs in 1 year and she will be seen on the PA schedule at that time.  In the meantime of encouraged her to stay as active as possible.  She is on aspirin and is on a statin.  HPI:   Megan Glover is a pleasant 77 y.o. female who I been following with aortoiliac occlusive disease.  She had a CT angiogram back in 2015 that showed a high-grade stenosis of the proximal right common iliac artery with a moderate stenosis of the left common iliac artery.  However she had stable claudication and we have been following this.  In addition she has degenerative disc disease of her back and fibromyalgia and a lot of her symptoms may not be related to her peripheral arterial disease.  In addition her arteries were heavily calcified which would make intervention higher risk.  She comes in for a 1 year follow-up study.  On my history, the patient really is not having any claudication at this point.  However I think her activity is fairly limited.  She apparently broke her sacrum and required surgery.  I do not get any history of rest pain or nonhealing ulcers.  She is on aspirin and is on a statin.  She is not a smoker.  Past Medical History:  Diagnosis Date   Anemia    Anxiety    Has tried Celexa and effexor, poor tolerance.   Cataract    bilateral -left > right    Clostridium difficile diarrhea    05-09-14 denies any problems at this time   COPD (chronic obstructive pulmonary disease) (HCC)    Depression    Fibromyalgia    History of diverticulosis    Hyperlipidemia    Statins aggravated myalgias:Pravastatin and Atorvastatin among some.   Hypertension    Hypothyroidism    IBS (irritable bowel syndrome)    Neuromuscular disorder (HCC)    fibromyalgia   Thyroid disease     Family History  Problem Relation Age of Onset   Breast cancer Mother        breast CA   Diabetes Father    Hypertension Father    Colon cancer Other    Breast cancer Sister    Breast cancer Sister    Cancer Sister        breast   Cancer Sister        breast    SOCIAL HISTORY: Social History   Tobacco Use   Smoking status: Former    Current packs/day: 0.00    Average packs/day: 1.5 packs/day for 25.0 years (37.5 ttl pk-yrs)    Types: Cigarettes    Start date: 05/10/1955    Quit date: 05/09/1980    Years since quitting: 42.8   Smokeless tobacco: Never  Substance Use Topics   Alcohol use: No    Alcohol/week: 0.0 standard drinks  of alcohol    Allergies  Allergen Reactions   Sulfa Antibiotics Other (See Comments) and Itching    Anal itching "anal itching"   Demerol  [Meperidine Hcl] Nausea And Vomiting   Levofloxacin In D5w Other (See Comments)    Yeast infections.  "Yeast infections"   Spironolactone Other (See Comments)    Weakness/malaise "Weakness" Other reaction(s): Other (See Comments) Weakness/malaise   Clindamycin     Other reaction(s): Other (See Comments)   Clindamycin/Lincomycin     CDIF   Erythromycin Diarrhea   Erythromycin Base     OTHER REACTION(S): Diarrhea Other reaction(s): Other (See Comments) OTHER REACTION(S): Diarrhea   Fluconazole Other (See Comments)   Hydrochlorothiazide Other (See Comments)    Weakness/malaise Other reaction(s): Other (See Comments) Weakness/malaise   Lincomycin Diarrhea    CDIF   Meperidine Nausea  And Vomiting, Other (See Comments) and Hypertension    Severe headache OTHER REACTION(S): Hypertension Severe headache   Ciprofloxacin Diarrhea   Levofloxacin Nausea Only    Yeast infection OTHER REACTION(S): Diarrhea Other reaction(s): Other (See Comments) Yeast infection   Metronidazole Nausea Only    OTHER REACTION(S): Diarrhea OTHER REACTION(S): Diarrhea    Other Itching    Current Outpatient Medications  Medication Sig Dispense Refill   acetaminophen (TYLENOL) 500 MG tablet Take 1,000 mg by mouth every 8 (eight) hours as needed for moderate pain.     albuterol (VENTOLIN HFA) 108 (90 Base) MCG/ACT inhaler INHALE 2 PUFFS INTO THE LUNGS EVERY 4 HOURS AS NEEDED 42.5 g 1   aspirin 81 MG chewable tablet Chew 81 mg by mouth.     Biotin 1000 MCG tablet Take by mouth.     Cholecalciferol (VITAMIN D3) 2000 units TABS Take 1 tablet by mouth daily.     clonazePAM (KLONOPIN) 0.5 MG tablet TAKE 1 TABLET(0.5 MG) BY MOUTH TWICE DAILY AS NEEDED FOR ANXIETY 60 tablet 1   hydrALAZINE (APRESOLINE) 10 MG tablet Take 1 tablet (10 mg total) by mouth 3 (three) times daily. 180 tablet 1   levothyroxine (SYNTHROID) 50 MCG tablet TAKE 1 TABLET(50 MCG) BY MOUTH DAILY BEFORE BREAKFAST 90 tablet 3   lidocaine (XYLOCAINE) 5 % ointment Apply 1 application topically as needed. 50 g 2   losartan (COZAAR) 100 MG tablet TAKE 1 TABLET(100 MG) BY MOUTH EVERY MORNING 90 tablet 3   meclizine (ANTIVERT) 25 MG tablet TAKE 1/2 TO 1 TABLET(12.5 TO 25 MG) BY MOUTH DAILY AS NEEDED FOR DIZZINESS 30 tablet 1   OVER THE COUNTER MEDICATION Apply 2 drops to eye at bedtime as needed (for itchy dry eyes.).     PROCTOZONE-HC 2.5 % rectal cream USE RECTALLY EVERY DAY AS NEEDED 30 g 1   rosuvastatin (CRESTOR) 10 MG tablet Take 1 tablet (10 mg total) by mouth daily. 30 tablet 3   tiZANidine (ZANAFLEX) 4 MG tablet TAKE 1 TABLET(4 MG) BY MOUTH EVERY 12 HOURS AS NEEDED FOR MUSCLE SPASMS 30 tablet 0   triamcinolone cream (KENALOG) 0.1 %  APPLY TOPICALLY TO THE AFFECTED AREA TWICE DAILY 30 g 1   No current facility-administered medications for this visit.    REVIEW OF SYSTEMS:  [X]  denotes positive finding, [ ]  denotes negative finding Cardiac  Comments:  Chest pain or chest pressure:    Shortness of breath upon exertion: x   Short of breath when lying flat:    Irregular heart rhythm:        Vascular    Pain in calf, thigh, or hip brought  on by ambulation:    Pain in feet at night that wakes you up from your sleep:     Blood clot in your veins:    Leg swelling:         Pulmonary    Oxygen at home:    Productive cough:     Wheezing:         Neurologic    Sudden weakness in arms or legs:     Sudden numbness in arms or legs:     Sudden onset of difficulty speaking or slurred speech:    Temporary loss of vision in one eye:     Problems with dizziness:         Gastrointestinal    Blood in stool:     Vomited blood:         Genitourinary    Burning when urinating:     Blood in urine:        Psychiatric    Major depression:         Hematologic    Bleeding problems:    Problems with blood clotting too easily:        Skin    Rashes or ulcers:        Constitutional    Fever or chills:     PHYSICAL EXAM:   Vitals:   03/09/23 1500  BP: (!) 153/80  Pulse: 94  Resp: 20  Temp: 98.1 F (36.7 C)  SpO2: 99%  Weight: 103 lb (46.7 kg)  Height: 5\' 7"  (1.702 m)    GENERAL: The patient is a well-nourished female, in no acute distress. The vital signs are documented above. CARDIAC: There is a regular rate and rhythm.  VASCULAR: I do not detect carotid bruits. On the right side she has a diminished femoral pulse.  I cannot palpate pedal pulses. On the left side she has a slightly diminished femoral pulse.  I cannot palpate pedal pulses. She has no ischemic ulcers. She has no lower extremity swelling. PULMONARY: There is good air exchange bilaterally without wheezing or rales. ABDOMEN: Soft and  non-tender with normal pitched bowel sounds.  MUSCULOSKELETAL: There are no major deformities or cyanosis. NEUROLOGIC: No focal weakness or paresthesias are detected. SKIN: There are no ulcers or rashes noted. PSYCHIATRIC: The patient has a normal affect.  DATA:    ARTERIAL DOPPLER STUDY: I have independently interpreted her arterial Doppler study today.  On the right side she has a monophasic posterior tibial and dorsalis pedis signal.  ABIs 59%.  Toe pressures 36 mmHg.  On the left side she has a biphasic posterior tibial signal with a triphasic dorsalis pedis signal.  ABIs 100%.  Toe pressures 83 mmHg.  Waverly Ferrari [facility] [phone]

## 2023-03-15 ENCOUNTER — Other Ambulatory Visit: Payer: Self-pay

## 2023-03-15 DIAGNOSIS — I739 Peripheral vascular disease, unspecified: Secondary | ICD-10-CM

## 2023-05-15 ENCOUNTER — Other Ambulatory Visit: Payer: Self-pay | Admitting: Family Medicine

## 2023-05-15 DIAGNOSIS — F411 Generalized anxiety disorder: Secondary | ICD-10-CM

## 2023-05-15 MED ORDER — CLONAZEPAM 0.5 MG PO TABS
ORAL_TABLET | ORAL | 0 refills | Status: DC
Start: 2023-05-15 — End: 2023-05-31

## 2023-05-15 NOTE — Telephone Encounter (Signed)
Prescription Request  05/15/2023  LOV: 11/02/2022  What is the name of the medication or equipment? clonazePAM (KLONOPIN) 0.5 MG tablet   Have you contacted your pharmacy to request a refill? No   Which pharmacy would you like this sent to?  [id number] - [place],  - [address] [place] [address] Phone: [phone] Fax: [phone]    Patient notified that their request is being sent to the clinical staff for review and that they should receive a response within 2 business days.   Please advise at Mobile [phone] (mobile)

## 2023-05-15 NOTE — Telephone Encounter (Signed)
Pt has been sch for 05-31-2023

## 2023-05-15 NOTE — Telephone Encounter (Signed)
Needs to schedule appt first.

## 2023-05-17 ENCOUNTER — Ambulatory Visit: Payer: Medicare Other | Admitting: Family Medicine

## 2023-05-31 ENCOUNTER — Ambulatory Visit: Payer: Medicare Other | Admitting: Family Medicine

## 2023-05-31 ENCOUNTER — Encounter: Payer: Self-pay | Admitting: Family Medicine

## 2023-05-31 VITALS — BP 126/80 | HR 82 | Temp 97.4°F | Resp 16 | Ht 67.0 in | Wt 103.1 lb

## 2023-05-31 DIAGNOSIS — I10 Essential (primary) hypertension: Secondary | ICD-10-CM | POA: Diagnosis not present

## 2023-05-31 DIAGNOSIS — M797 Fibromyalgia: Secondary | ICD-10-CM | POA: Diagnosis not present

## 2023-05-31 DIAGNOSIS — F411 Generalized anxiety disorder: Secondary | ICD-10-CM

## 2023-05-31 DIAGNOSIS — K581 Irritable bowel syndrome with constipation: Secondary | ICD-10-CM

## 2023-05-31 DIAGNOSIS — R636 Underweight: Secondary | ICD-10-CM

## 2023-05-31 DIAGNOSIS — Z681 Body mass index (BMI) 19 or less, adult: Secondary | ICD-10-CM

## 2023-05-31 MED ORDER — CLONAZEPAM 0.5 MG PO TABS
ORAL_TABLET | ORAL | 3 refills | Status: DC
Start: 2023-05-31 — End: 2023-10-23

## 2023-05-31 NOTE — Assessment & Plan Note (Signed)
In general problem is stable, she has not tolerated SSRIs and SNRI's in the past. Continue clonazepam 0.5 mg twice daily as needed. PDMP reviewed. Follow-up in 6 months, before if needed.

## 2023-05-31 NOTE — Assessment & Plan Note (Signed)
Left costal/rib cage pain could be related to this problem.according to her son, she has had workup done when pain started, after a fall in 04/2021, and negative.   She has not tolerated gabapentin in the past. Continue Tylenol 500 mg 3-4 times per day. Low impact physical activity as tolerated, fall precautions. Adequate sleep hygiene.

## 2023-05-31 NOTE — Assessment & Plan Note (Signed)
BP today adequately controlled. She has intolerance to multiple antihypertensive medication in the past, she discontinue hydralazine 10 mg about 3 to 4 weeks ago. Continue losartan 100 mg daily and low-salt diet. Continue monitoring BP regularly. Follow-up in 6 months.

## 2023-05-31 NOTE — Assessment & Plan Note (Addendum)
Ideal body wt 136 Lb. She has lost about 3 Lb since her last visit. Stressed the importance of adequate protein intake. We discussed possible complications. We did not do her labs today because according to patient, she has had blood work at her dermatologist office after starting treatment for psoriasis, she will have lab results faxed to my office.

## 2023-05-31 NOTE — Progress Notes (Unsigned)
HPI: [name] is a 77 y.o. female with a PMHs significant for PAD, HTN, peripheral neuropathy, chronic back pain, fibromyalgia, IBS, anxiety, hearing loss, and hypothyroidism here today with her son for chronic disease management.  Last seen on 11/02/2022  Hypertension: She is still taking losartan 100 mg daily. She is no longer taking hydralazine because she believed it was causing hair loss, stopped it about 4 weeks ago. Checking BP at home: She says she checks her BP at home using a wrist cuff about every 3 days. She says her numbers were "good."  Negative for unusual or severe headache, visual changes, exertional chest pain, dyspnea,  focal weakness, or edema. She says has not had an eye exam for two years.   Lab Results  Component Value Date   NA 133 (L) 08/11/2022   CL 98 08/11/2022   K 4.5 08/11/2022   CO2 27 08/11/2022   BUN 28 (H) 08/11/2022   CREATININE 0.65 08/11/2022   GFRNONAA >60 08/11/2022   CALCIUM 9.7 08/11/2022   ALBUMIN 4.3 03/05/2021   GLUCOSE 110 (H) 08/11/2022   She is still losing wt.  Her son reports that they grocery shop together and she is getting protein from chicken and fish. He notes her IBS makes it difficult for her to eat sometimes, some foods exacerbate symptoms. She usually eats ham and [place] sandwich.  Hair loss: Inquiring about possible causes. She has seen dermatology in [place] for hair loss.  States that she had blood work done because she was started on medication for psoriasis and eczema. She denies FMHx of hair loss.   Anxiety:Her son reports she is having frequent anxiety about "literally everything." Still going through a lot of stress due to financial difficulties. She is currently on clonazepam 0.5 mg bid prn. She has not tolerated medications for anxiety. She has been on clonazepam for years.  IBS-C: She says she is constipated today. She says she hasn't had a bowel movement since the weekend.  Her son  states that she has not been taking OTC laxatives proactively. She also take fiber supplements. She has not seen blood in stool.  Chronic back pain and fibromyalgia:  She complains of some pain on the left side of her ribcage. States that she has had pain since fall in 04/2021, had humeral fracture, completed PT. Pain did not go away. According to her son, she had imaging done and negative.  Review of Systems  Constitutional:  Positive for fatigue. Negative for chills and fever.  HENT:  Negative for mouth sores, sore throat and trouble swallowing.   Respiratory:  Negative for cough and wheezing.   Gastrointestinal:  Negative for abdominal pain, nausea and vomiting.  Endocrine: Negative for cold intolerance and heat intolerance.  Genitourinary:  Negative for decreased urine volume, dysuria and hematuria.  Musculoskeletal:  Positive for arthralgias, back pain, gait problem and myalgias.  Skin:  Positive for rash (ezcema.).  Neurological:  Negative for syncope and facial asymmetry.  Psychiatric/Behavioral:  Negative for confusion and hallucinations. The patient is nervous/anxious.   See other pertinent positives and negatives in HPI.  Current Outpatient Medications on File Prior to Visit  Medication Sig Dispense Refill   acetaminophen (TYLENOL) 500 MG tablet Take 1,000 mg by mouth every 8 (eight) hours as needed for moderate pain.     albuterol (VENTOLIN HFA) 108 (90 Base) MCG/ACT inhaler INHALE 2 PUFFS INTO THE LUNGS EVERY 4 HOURS AS NEEDED 42.5 g 1   aspirin 81  MG chewable tablet Chew 81 mg by mouth.     Biotin 1000 MCG tablet Take by mouth.     Cholecalciferol (VITAMIN D3) 2000 units TABS Take 1 tablet by mouth daily.     levothyroxine (SYNTHROID) 50 MCG tablet TAKE 1 TABLET(50 MCG) BY MOUTH DAILY BEFORE BREAKFAST 90 tablet 3   lidocaine (XYLOCAINE) 5 % ointment Apply 1 application topically as needed. 50 g 2   losartan (COZAAR) 100 MG tablet TAKE 1 TABLET(100 MG) BY MOUTH EVERY MORNING  90 tablet 3   meclizine (ANTIVERT) 25 MG tablet TAKE 1/2 TO 1 TABLET(12.5 TO 25 MG) BY MOUTH DAILY AS NEEDED FOR DIZZINESS 30 tablet 1   OVER THE COUNTER MEDICATION Apply 2 drops to eye at bedtime as needed (for itchy dry eyes.).     PROCTOZONE-HC 2.5 % rectal cream USE RECTALLY EVERY DAY AS NEEDED 30 g 1   rosuvastatin (CRESTOR) 10 MG tablet Take 1 tablet (10 mg total) by mouth daily. 30 tablet 3   tiZANidine (ZANAFLEX) 4 MG tablet TAKE 1 TABLET(4 MG) BY MOUTH EVERY 12 HOURS AS NEEDED FOR MUSCLE SPASMS 30 tablet 0   triamcinolone cream (KENALOG) 0.1 % APPLY TOPICALLY TO THE AFFECTED AREA TWICE DAILY 30 g 1   No current facility-administered medications on file prior to visit.    Past Medical History:  Diagnosis Date   Anemia    Anxiety    Has tried Celexa and effexor, poor tolerance.   Cataract    bilateral -left > right   Clostridium difficile diarrhea    05-09-14 denies any problems at this time   COPD (chronic obstructive pulmonary disease) (HCC)    Depression    Fibromyalgia    History of diverticulosis    Hyperlipidemia    Statins aggravated myalgias:Pravastatin and Atorvastatin among some.   Hypertension    Hypothyroidism    IBS (irritable bowel syndrome)    Neuromuscular disorder (HCC)    fibromyalgia   Thyroid disease    Allergies  Allergen Reactions   Sulfa Antibiotics Other (See Comments) and Itching    Anal itching "anal itching"   Demerol  [Meperidine Hcl] Nausea And Vomiting   Levofloxacin In D5w Other (See Comments)    Yeast infections.  "Yeast infections"   Spironolactone Other (See Comments)    Weakness/malaise "Weakness" Other reaction(s): Other (See Comments) Weakness/malaise   Clindamycin     Other reaction(s): Other (See Comments)   Clindamycin/Lincomycin     CDIF   Erythromycin Diarrhea   Erythromycin Base     OTHER REACTION(S): Diarrhea Other reaction(s): Other (See Comments) OTHER REACTION(S): Diarrhea   Fluconazole Other (See Comments)    Hydrochlorothiazide Other (See Comments)    Weakness/malaise Other reaction(s): Other (See Comments) Weakness/malaise   Lincomycin Diarrhea    CDIF   Meperidine Nausea And Vomiting, Other (See Comments) and Hypertension    Severe headache OTHER REACTION(S): Hypertension Severe headache   Ciprofloxacin Diarrhea   Levofloxacin Nausea Only    Yeast infection OTHER REACTION(S): Diarrhea Other reaction(s): Other (See Comments) Yeast infection   Metronidazole Nausea Only    OTHER REACTION(S): Diarrhea OTHER REACTION(S): Diarrhea    Other Itching    Social History   Socioeconomic History   Marital status: Widowed    Spouse name: Brianda Beitler   Number of children: Not on file   Years of education: Not on file   Highest education level: Not on file  Occupational History   Not on file  Tobacco Use  Smoking status: Former    Current packs/day: 0.00    Average packs/day: 1.5 packs/day for 25.0 years (37.5 ttl pk-yrs)    Types: Cigarettes    Start date: 05/10/1955    Quit date: 05/09/1980    Years since quitting: 43.0   Smokeless tobacco: Never  Vaping Use   Vaping status: Never Used  Substance and Sexual Activity   Alcohol use: No    Alcohol/week: 0.0 standard drinks of alcohol   Drug use: No   Sexual activity: Not Currently  Other Topics Concern   Not on file  Social History Narrative   Not on file   Social Determinants of Health   Financial Resource Strain: High Risk (09/02/2021)   Overall Financial Resource Strain (CARDIA)    Difficulty of Paying Living Expenses: Hard  Food Insecurity: No Food Insecurity (05/16/2022)   Hunger Vital Sign    Worried About Running Out of Food in the Last Year: Never true    Ran Out of Food in the Last Year: Never true  Transportation Needs: No Transportation Needs (05/16/2022)   PRAPARE - Administrator, Civil Service (Medical): No    Lack of Transportation (Non-Medical): No  Physical Activity: Unknown (09/02/2021)    Exercise Vital Sign    Days of Exercise per Week: Patient declined    Minutes of Exercise per Session: Not on file  Recent Concern: Physical Activity - Inactive (08/30/2021)   Exercise Vital Sign    Days of Exercise per Week: 0 days    Minutes of Exercise per Session: 0 min  Stress: Stress Concern Present (08/30/2021)   [employer] of Occupational Health - Occupational Stress Questionnaire    Feeling of Stress : To some extent  Social Connections: Moderately Integrated (06/23/2020)   Social Connection and Isolation Panel [NHANES]    Frequency of Communication with Friends and Family: Three times a week    Frequency of Social Gatherings with Friends and Family: Once a week    Attends Religious Services: More than 4 times per year    Active Member of Clubs or Organizations: No    Attends Banker Meetings: Never    Marital Status: Married   Vitals:   05/31/23 1455  BP: 126/80  Pulse: 82  Resp: 16  Temp: (!) 97.4 F (36.3 C)  SpO2: 99%   Wt Readings from Last 3 Encounters:  05/31/23 103 lb 2 oz (46.8 kg)  03/09/23 103 lb (46.7 kg)  01/31/23 109 lb (49.4 kg)   Body mass index is 16.15 kg/m.  Physical Exam Vitals and nursing note reviewed.  Constitutional:      General: She is not in acute distress.    Appearance: She is well-developed and underweight.  HENT:     Head: Normocephalic and atraumatic.     Mouth/Throat:     Mouth: Mucous membranes are moist.  Eyes:     Conjunctiva/sclera: Conjunctivae normal.  Cardiovascular:     Rate and Rhythm: Normal rate and regular rhythm.     Heart sounds: No murmur heard. Pulmonary:     Effort: Pulmonary effort is normal. No respiratory distress.     Breath sounds: Normal breath sounds.  Abdominal:     Palpations: Abdomen is soft. There is no mass.     Tenderness: There is no abdominal tenderness.  Musculoskeletal:     Right lower leg: No edema.     Left lower leg: No edema.     Comments: Positive tender trigger  points around thoracic and lumbar muscles as well and rib cages. No rib cages masses or deformities appreciated.  Skin:    General: Skin is warm.     Findings: No erythema.  Neurological:     General: No focal deficit present.     Mental Status: She is alert and oriented to person, place, and time.     Cranial Nerves: No cranial nerve deficit.     Comments: Unstable gait, assisted with a cane.  Psychiatric:        Mood and Affect: Affect normal. Mood is anxious.   ASSESSMENT AND PLAN:  Ms. Mena was seen today for chronic disease management.  Fibromyalgia Assessment & Plan: Left costal/rib cage pain could be related to this problem.according to her son, she has had workup done when pain started, after a fall in 04/2021, and negative.   She has not tolerated gabapentin in the past. Continue Tylenol 500 mg 3-4 times per day. Low impact physical activity as tolerated, fall precautions. Adequate sleep hygiene.   Essential hypertension Assessment & Plan: BP today adequately controlled. She has intolerance to multiple antihypertensive medication in the past, she discontinue hydralazine 10 mg about 3 to 4 weeks ago. Continue losartan 100 mg daily and low-salt diet. Continue monitoring BP regularly. Follow-up in 6 months.   Generalized anxiety disorder Assessment & Plan: In general problem is stable, she has not tolerated SSRIs and SNRI's in the past. Continue clonazepam 0.5 mg twice daily as needed. PDMP reviewed. Follow-up in 6 months, before if needed.  Orders: -     clonazePAM; TAKE 1 TABLET(0.5 MG) BY MOUTH TWICE DAILY AS NEEDED FOR ANXIETY  Dispense: 60 tablet; Refill: 3  Underweight (BMI < 18.5) Assessment & Plan: Ideal body wt 136 Lb. She has lost about 3 Lb since her last visit. Stressed the importance of adequate protein intake. We discussed possible complications. We did not do her labs today because according to patient, she has had blood work at her  dermatologist office after starting treatment for psoriasis, she will have lab results faxed to my office.   Irritable bowel syndrome with constipation Assessment & Plan: Still having some issues with constipation. According to her son, she has not been consistent with taking OTC medications. Recommend MiraLAX and bisacodyl 5 mg daily or every other day. Adequate hydration and fiber intake also recommended.    I spent a total of 44 minutes in both face to face and non face to face activities for this visit on the date of this encounter. During this time history was obtained and documented, examination was performed, prior labs reviewed, and assessment/plan discussed.  Return in about 6 months (around 11/29/2023) for chronic problems.  I, Rolla Etienne Wierda, acting as a scribe for Maxamus Colao [place], MD., have documented all relevant documentation on the behalf of Megan Skluzacek [place], MD, as directed by  Jaziah Goeller [place], MD while in the presence of Megan Kittleson [place], MD.   I, Nabila Albarracin [place], MD, have reviewed all documentation for this visit. The documentation on 06/01/23 for the exam, diagnosis, procedures, and orders are all accurate and complete.  Marzell Isakson G. [place], MD  [facility]. [place] office.

## 2023-05-31 NOTE — Assessment & Plan Note (Signed)
Still having some issues with constipation. According to her son, she has not been consistent with taking OTC medications. Recommend MiraLAX and bisacodyl 5 mg daily or every other day. Adequate hydration and fiber intake also recommended.

## 2023-05-31 NOTE — Patient Instructions (Addendum)
A few things to remember from today's visit:  Hypertensive urgency  Generalized anxiety disorder  Weight loss, abnormal  Fibromyalgia  Wt Readings from Last 3 Encounters:  05/31/23 103 lb 2 oz (46.8 kg)  03/09/23 103 lb (46.7 kg)  01/31/23 109 lb (49.4 kg)   No changes today. Continue monitoring blood pressure regularly. Increase protein intake. For constipation miralax and Bisacodyl daily or every other day.  If you need refills for medications you take chronically, please call your pharmacy. Do not use My Chart to request refills or for acute issues that need immediate attention. If you send a my chart message, it may take a few days to be addressed, specially if I am not in the office.  Please be sure medication list is accurate. If a new problem present, please set up appointment sooner than planned today.

## 2023-06-15 ENCOUNTER — Other Ambulatory Visit: Payer: Self-pay | Admitting: Family Medicine

## 2023-06-15 DIAGNOSIS — R42 Dizziness and giddiness: Secondary | ICD-10-CM

## 2023-07-18 ENCOUNTER — Other Ambulatory Visit: Payer: Self-pay | Admitting: Family Medicine

## 2023-08-28 ENCOUNTER — Telehealth: Payer: Self-pay

## 2023-08-28 ENCOUNTER — Other Ambulatory Visit: Payer: Self-pay | Admitting: Family Medicine

## 2023-08-28 DIAGNOSIS — H00019 Hordeolum externum unspecified eye, unspecified eyelid: Secondary | ICD-10-CM

## 2023-08-28 MED ORDER — ERYTHROMYCIN 5 MG/GM OP OINT
TOPICAL_OINTMENT | OPHTHALMIC | 0 refills | Status: AC
Start: 2023-08-28 — End: 2023-09-06

## 2023-08-28 NOTE — Telephone Encounter (Signed)
 Copied from CRM [id number]. Topic: Clinical - Medication Question >> Aug 28, 2023  2:58 PM Megan Glover wrote: Reason for CRM: patient called in stating that the medication that she is wanting for the sty on her eye is called axithromycin. She would like to know if she could sent this rx in for her

## 2023-08-28 NOTE — Telephone Encounter (Signed)
 Copied from CRM [id number]. Topic: Clinical - Medication Refill >> Aug 28, 2023 11:04 AM Pinkey ORN wrote: Most Recent Primary Care Visit:  Provider: JORDAN, BETTY G  Department: [facility]  Visit Type: OFFICE VISIT  Date: 05/31/2023  Medication: ***  Has the patient contacted their pharmacy?  (Agent: If no, request that the patient contact the pharmacy for the refill. If patient does not wish to contact the pharmacy document the reason why and proceed with request.) (Agent: If yes, when and what did the pharmacy advise?)  Is this the correct pharmacy for this prescription?  If no, delete pharmacy and type the correct one.  This is the patient's preferred pharmacy:  [id number] - [place], [place] - [address] [place] [address] Phone: [phone] Fax: [phone]   Has the prescription been filled recently?   Is the patient out of the medication?   Has the patient been seen for an appointment in the last year OR does the patient have an upcoming appointment?   Can we respond through MyChart?   Agent: Please be advised that Rx refills may take up to 3 business days. We ask that you follow-up with your pharmacy.

## 2023-08-28 NOTE — Telephone Encounter (Signed)
 Copied from CRM [id number]. Topic: Clinical - Medical Advice >> Aug 28, 2023 11:10 AM Pinkey ORN wrote: Reason for CRM: Medication Request >> Aug 28, 2023 11:13 AM Pinkey ORN wrote: Patient states she has a stye on her eye and is wanting for Jordan, Betty MD to send her out a prescription of the medication she once used before. Patient was unable to provide the exact name and the medication isn't listed in her chart. Patient states Jordan will know exactly which medication she is referring to and would love to receive a call back on rather or not she's able to get the prescription. Call back number [phone]

## 2023-08-28 NOTE — Telephone Encounter (Signed)
 I tried calling patient, line keeps ringing with no answer.

## 2023-08-28 NOTE — Telephone Encounter (Signed)
 I sent a prescription for erythromycin ophthalmic ointment to apply on affected area twice daily for 7 to 10 days. If problem is persistent, she needs to be seen by her eye care provider. Thanks, BJ

## 2023-08-29 NOTE — Telephone Encounter (Signed)
 Copied from CRM [id number]. Topic: Clinical - Medication Question >> Aug 28, 2023  5:24 PM Megan Glover wrote: Reason for CRM: Patient called in stated that she attempted to answer the phone call from the office but her phone was malfunctioning.  Advised the patient that the nature of the call was for a prescription for her eye. Also advised her the provider noted if the problem persist to please follow up with her eye care provider.

## 2023-09-10 IMAGING — DX DG SHOULDER 2+V*L*
3 series · 3 of 3 positions shown · non-contrast
Comparison: Chest radiographs 04/09/2018 and earlier.

CLINICAL DATA: 75-year-old female status post fall this morning
landing on left shoulder.

EXAM:
LEFT SHOULDER - 2+ VIEW

[shoulder grashey]
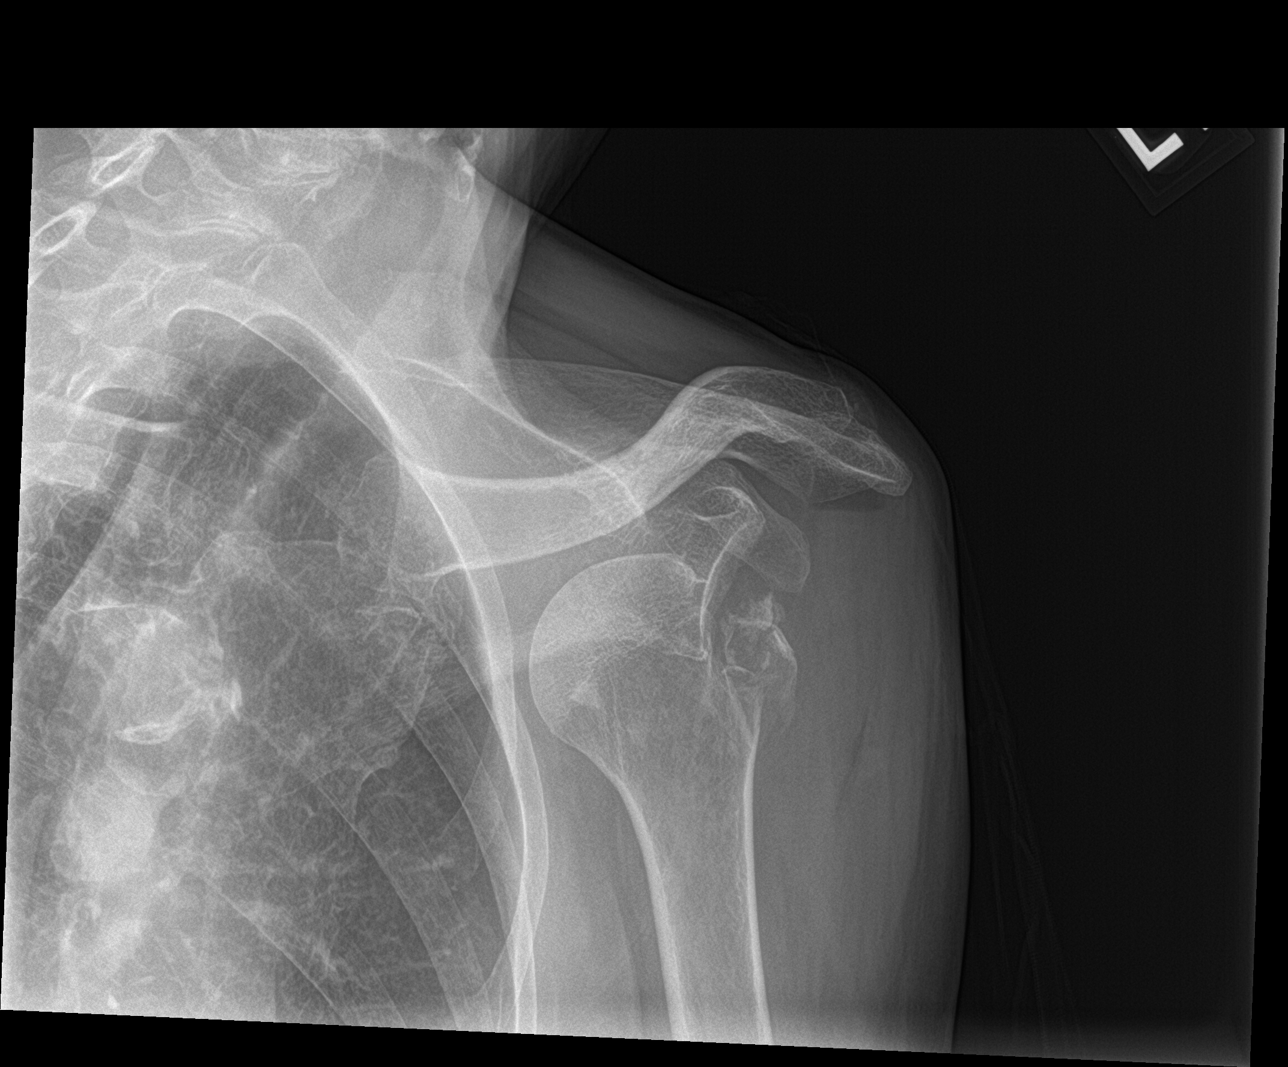

[shoulder y view]
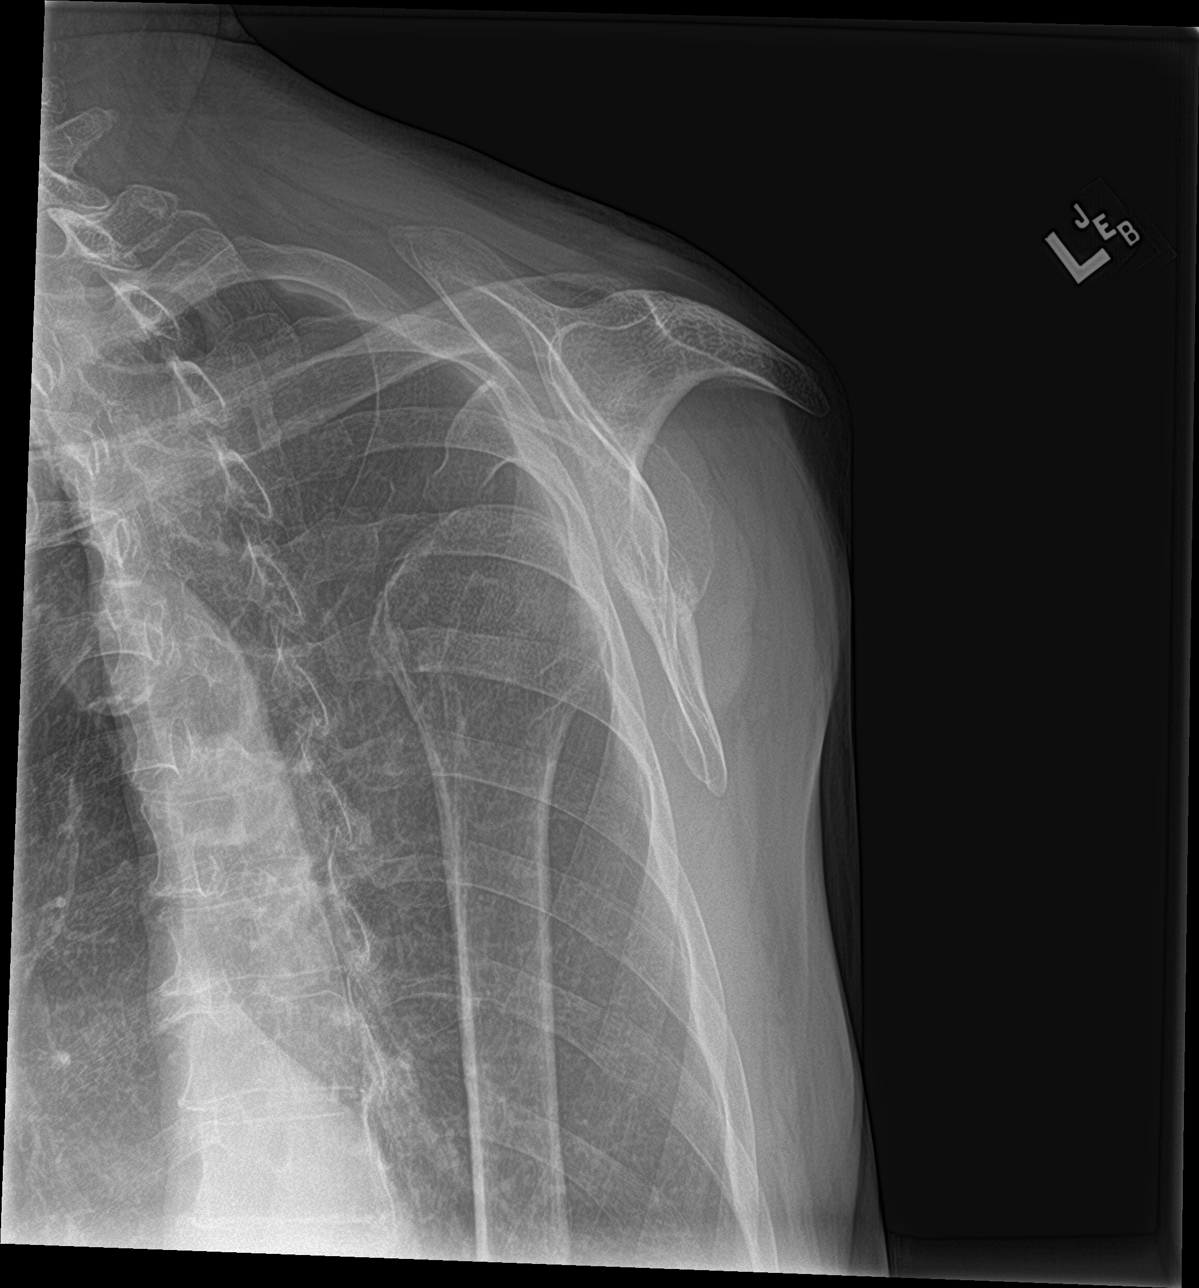

[shoulder ap neutral]
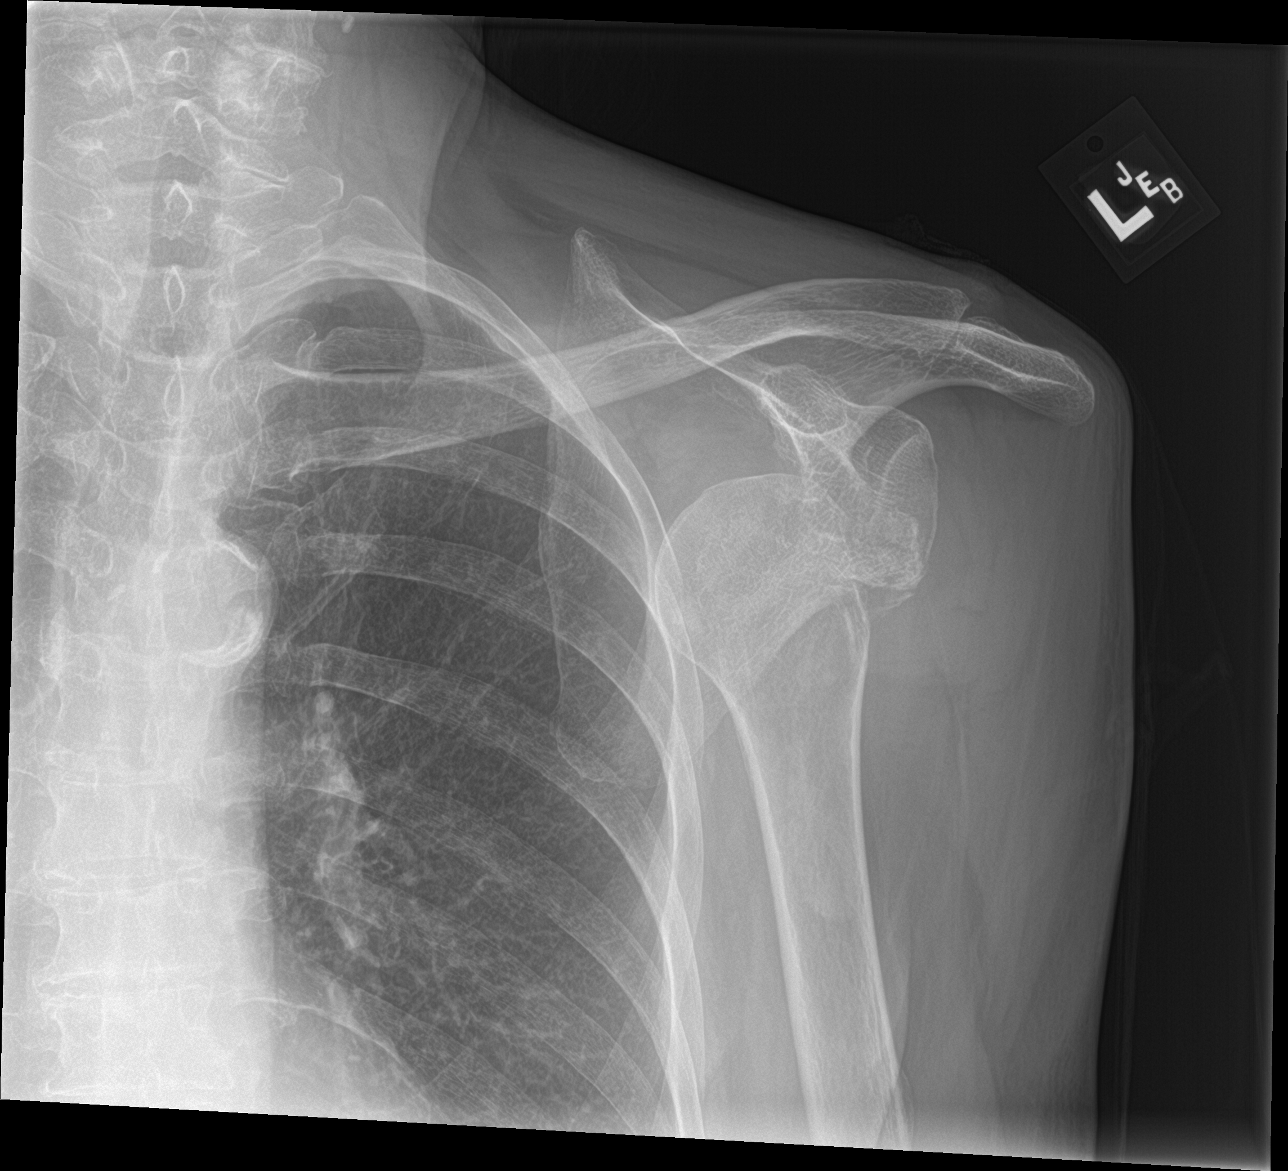

[3 of 3 positions shown; findings below may reference images not displayed]

FINDINGS: Anterior, subcoracoid glenohumeral dislocation with associated
comminuted fracture of the greater tuberosity. Mild displacement of
comminution fragments. The glenoid and left scapula appear to remain
intact. Left clavicle appears intact. Visible left ribs appear
intact, pulmonary hyperinflation suspected, clear visible left lung.
IMPRESSION: Anterior left glenohumeral fracture dislocation.
Comminution and mild displacement of the greater tuberosity. No
scapula fracture identified.

## 2023-10-21 ENCOUNTER — Other Ambulatory Visit: Payer: Self-pay | Admitting: Family Medicine

## 2023-10-21 DIAGNOSIS — F411 Generalized anxiety disorder: Secondary | ICD-10-CM

## 2023-11-29 ENCOUNTER — Ambulatory Visit: Payer: Medicare Other | Admitting: Family Medicine

## 2023-12-15 ENCOUNTER — Telehealth: Payer: Self-pay

## 2023-12-15 NOTE — Telephone Encounter (Signed)
 Copied from CRM [id number]. Topic: General - Other >> Dec 15, 2023 12:09 PM Orien Bird wrote: Reason for CRM: Patient called stating she is in need of a vascular vein specialist patient call back number [phone] >> Dec 15, 2023 12:29 PM Kita Perish H wrote: Patiens son Myrtie Atkinson called to let office know that patient does not need an appointment for vein specialist. States mom has dementia and seen a letter from vascular clinic saying they were moving not that she needs an appointment so previous message can be disregarded.  Myrtie Atkinson [phone] >> Dec 15, 2023 12:17 PM Turkey A wrote: Patient called back to let office know when scheduling appointment she can only have afternoon appointment

## 2023-12-15 NOTE — Telephone Encounter (Signed)
 Copied from CRM [id number]. Topic: General - Other >> Dec 15, 2023 12:09 PM Megan Glover wrote: Reason for CRM: Patient called stating she is in need of a vascular vein specialist patient call back number [phone]

## 2023-12-19 DIAGNOSIS — E785 Hyperlipidemia, unspecified: Secondary | ICD-10-CM | POA: Insufficient documentation

## 2023-12-20 ENCOUNTER — Ambulatory Visit (INDEPENDENT_AMBULATORY_CARE_PROVIDER_SITE_OTHER): Admitting: Family Medicine

## 2023-12-20 ENCOUNTER — Encounter: Payer: Self-pay | Admitting: Family Medicine

## 2023-12-20 VITALS — BP 160/80 | HR 90 | Temp 97.8°F | Resp 16 | Ht 67.0 in | Wt 104.2 lb

## 2023-12-20 DIAGNOSIS — Z780 Asymptomatic menopausal state: Secondary | ICD-10-CM

## 2023-12-20 DIAGNOSIS — E785 Hyperlipidemia, unspecified: Secondary | ICD-10-CM | POA: Diagnosis not present

## 2023-12-20 DIAGNOSIS — F411 Generalized anxiety disorder: Secondary | ICD-10-CM | POA: Diagnosis not present

## 2023-12-20 DIAGNOSIS — E559 Vitamin D deficiency, unspecified: Secondary | ICD-10-CM

## 2023-12-20 DIAGNOSIS — G8929 Other chronic pain: Secondary | ICD-10-CM

## 2023-12-20 DIAGNOSIS — I10 Essential (primary) hypertension: Secondary | ICD-10-CM

## 2023-12-20 DIAGNOSIS — E039 Hypothyroidism, unspecified: Secondary | ICD-10-CM | POA: Diagnosis not present

## 2023-12-20 DIAGNOSIS — M25512 Pain in left shoulder: Secondary | ICD-10-CM

## 2023-12-20 DIAGNOSIS — I7409 Other arterial embolism and thrombosis of abdominal aorta: Secondary | ICD-10-CM

## 2023-12-20 DIAGNOSIS — I739 Peripheral vascular disease, unspecified: Secondary | ICD-10-CM

## 2023-12-20 DIAGNOSIS — K581 Irritable bowel syndrome with constipation: Secondary | ICD-10-CM

## 2023-12-20 DIAGNOSIS — J441 Chronic obstructive pulmonary disease with (acute) exacerbation: Secondary | ICD-10-CM

## 2023-12-20 DIAGNOSIS — R42 Dizziness and giddiness: Secondary | ICD-10-CM

## 2023-12-20 LAB — COMPREHENSIVE METABOLIC PANEL WITH GFR
ALT: 16 U/L (ref 0–35)
AST: 27 U/L (ref 0–37)
Albumin: 4.5 g/dL (ref 3.5–5.2)
Alkaline Phosphatase: 58 U/L (ref 39–117)
BUN: 18 mg/dL (ref 6–23)
CO2: 25 meq/L (ref 19–32)
Calcium: 9.5 mg/dL (ref 8.4–10.5)
Chloride: 100 meq/L (ref 96–112)
Creatinine, Ser: 0.79 mg/dL (ref 0.40–1.20)
GFR: 71.54 mL/min (ref >60.00)
Glucose, Bld: 108 mg/dL — ABNORMAL HIGH (ref 70–99)
Potassium: 4.1 meq/L (ref 3.5–5.1)
Sodium: 133 meq/L — ABNORMAL LOW (ref 135–145)
Total Bilirubin: 0.8 mg/dL (ref 0.2–1.2)
Total Protein: 6.9 g/dL (ref 6.0–8.3)

## 2023-12-20 LAB — CBC
HCT: 34.6 % — ABNORMAL LOW (ref 36.0–46.0)
Hemoglobin: 11.8 g/dL — ABNORMAL LOW (ref 12.0–15.0)
MCHC: 33.9 g/dL (ref 30.0–36.0)
MCV: 94.8 fl (ref 78.0–100.0)
Platelets: 230 10*3/uL (ref 150.0–400.0)
RBC: 3.65 Mil/uL — ABNORMAL LOW (ref 3.87–5.11)
RDW: 13.1 % (ref 11.5–15.5)
WBC: 5.9 10*3/uL (ref 4.0–10.5)

## 2023-12-20 LAB — LIPID PANEL
Cholesterol: 189 mg/dL (ref 0–200)
HDL: 73 mg/dL (ref >39.00)
LDL Cholesterol: 107 mg/dL — ABNORMAL HIGH (ref 0–99)
NonHDL: 116.44
Total CHOL/HDL Ratio: 3
Triglycerides: 46 mg/dL (ref 0.0–149.0)
VLDL: 9.2 mg/dL (ref 0.0–40.0)

## 2023-12-20 LAB — TSH: TSH: 1.13 u[IU]/mL (ref 0.35–5.50)

## 2023-12-20 LAB — T4, FREE: Free T4: 1.09 ng/dL (ref 0.60–1.60)

## 2023-12-20 MED ORDER — ROSUVASTATIN CALCIUM 10 MG PO TABS: 10.0000 mg | ORAL_TABLET | Freq: Every day | ORAL | 3 refills | Status: AC

## 2023-12-20 NOTE — Assessment & Plan Note (Signed)
 Stable. She has not tolerated SSRIs and SNRI's in the past. Continue clonazepam  0.5 mg twice daily as needed. PDMP reviewed. Follow-up in 6 months, before if needed.

## 2023-12-20 NOTE — Assessment & Plan Note (Signed)
 In general problem is stable. Continue Albuterol  inh 1-2 puff qid prn.

## 2023-12-20 NOTE — Patient Instructions (Addendum)
 A few things to remember from today's visit:  Essential hypertension - Plan: Comprehensive metabolic panel with GFR, CBC  Generalized anxiety disorder  Hypothyroidism, unspecified type - Plan: TSH, T4, free  Vitamin D  deficiency, unspecified  Hyperlipidemia, unspecified hyperlipidemia type - Plan: Comprehensive metabolic panel with GFR  Chronic left shoulder pain  Asymptomatic postmenopausal estrogen deficiency - Plan: DG Bone Density  PAD (peripheral artery disease) (HCC)  No changes today. Try Voltaren gel for left shoulder pain. Bone density will be arrange. Continue vit D and take calcium  through your diet.  Continue monitoring blood pressure.  If you need refills for medications you take chronically, please call your pharmacy. Do not use My Chart to request refills or for acute issues that need immediate attention. If you send a my chart message, it may take a few days to be addressed, specially if I am not in the office.  Please be sure medication list is accurate. If a new problem present, please set up appointment sooner than planned today.

## 2023-12-20 NOTE — Assessment & Plan Note (Signed)
 Problem has been well controlled. Last TSH 1.6 in 08/2022. Continue Levothyroxine  50 mg daily.

## 2023-12-20 NOTE — Assessment & Plan Note (Signed)
 Abdominal tenderness with palpation, no more than her normal. Constipation has improved with Colace, sometimes causes diarrhea, so recommend taking it every other day. Continue adequate fiber and fluid intake.

## 2023-12-20 NOTE — Assessment & Plan Note (Signed)
 Reports 3-4 months of right foot and lateral calf pain (above ankle), which could be related with fibromyalgia or OA.  No signs of acute ischemic limb.  Recommend resuming statin, Rosuvastatin . Continue Aspirin 81 mg daily and good foot care. ABI order placed. Vascular referral placed. Clearly instructed about warning signs.

## 2023-12-20 NOTE — Assessment & Plan Note (Signed)
 Continue same dose of vit D supplementation. Further recommendations according to 25 OH vit D result.

## 2023-12-20 NOTE — Assessment & Plan Note (Signed)
 She has not been on Rosuvastatin  10 mg for months, not sure why she discontinued medication. We discussed the benefits of statins. She agrees with resuming medication. LDL 85 in 02/2021. Further recommendations according to lipid panel result.

## 2023-12-20 NOTE — Progress Notes (Unsigned)
 HPI: Megan Glover is a 78 y.o. female with a PMHx significant for PAD, HTN, peripheral neuropathy, chronic back pain, fibromyalgia, IBS, anxiety, hearing loss, and hypothyroidism, who is here today with her son for chronic disease management.  Last seen on 05/31/2023.  Her vascular surgeon has retired and she will need a new provider. She last saw him on 02/17/2023. Her son says it is difficult for her to be active.   Leg pain:  Patient complains of a new pain in her feet for about four months. Also notices some blue discoloration in her feet at the end of the day.  She was not having it at the time of her last appointment with vascular surgery.  03/09/2023 Vascular Ultrasound ABI Summary:  Right: Resting right ankle- brachial index indicates moderate right lower extremity arterial disease. The right toe- brachial index is abnormal. Left: Resting left ankle- brachial index is within normal range. The left toe- brachial index is abnormal.  Rib pain:  Patient also complains of pain in her ribs that she has had since a fall in 03/2018.   Shoulder pain:  Patient also complains of right shoulder pain. She has tried PT without significant relief.   Fibromyalgia/peripheral neuropathy/back pain:  She takes tylenol  and ibuprofen for pain.   Anxiety:  Currently on clonazepam  0.5 mg twice daily as needed.   Hypothyroidism:  Currently on levothyroxine  50 mcg daily.  Lab Results  Component Value Date   TSH 1.62 08/24/2022   Hypertension:  Medications: Currently on losartan  100 mg daily.  BP readings at home: She checks her BP every 3 days and says her readings are normal.  Side effects: none Negative for unusual or severe headache, visual changes, exertional chest pain, dyspnea, palpitations, focal weakness, or edema.  Lab Results  Component Value Date   CREATININE 0.65 08/11/2022   BUN 28 (H) 08/11/2022   NA 133 (L) 08/11/2022   K 4.5 08/11/2022   CL 98 08/11/2022   CO2  27 08/11/2022   Hyperlipidemia: Not taking rosuvastatin  10 mg.  Lab Results  Component Value Date   CHOL 158 03/05/2021   HDL 59.50 03/05/2021   LDLCALC 85 03/05/2021   TRIG 68.0 03/05/2021   CHOLHDL 3 03/05/2021   Eczema:  Currently on Rinvoq prescribed by dermatology.   Weight:  Diet: She says her appetite is good. She is eating protein with every meal. She eats a lot of chicken.   Constipation:  Currently on Colace. She says she is having consistent bowel movements.  Lab Results  Component Value Date   WBC 6.5 01/31/2023   HGB 12.3 01/31/2023   HCT 35.8 (L) 01/31/2023   MCV 91.8 01/31/2023   PLT 262 01/31/2023    She takes vitamin D  supplementation but cannot remember the dosage.  Lab Results  Component Value Date   VD25OH 58.34 03/05/2021   She has occasional cough, wheezing, and SOB, but not significantly worse than usual. These symptoms worsen with humidity.  She uses an albuterol  inhaler.   Review of Systems  Constitutional:  Positive for fatigue. Negative for activity change and appetite change.  HENT:  Negative for mouth sores and sore throat.   Cardiovascular:  Negative for chest pain, palpitations and leg swelling.  Endocrine: Negative for cold intolerance and heat intolerance.  Genitourinary:  Negative for decreased urine volume, dysuria and hematuria.  Musculoskeletal:  Positive for arthralgias, back pain, gait problem and myalgias.  Skin:  Negative for rash.  Neurological:  Negative  for syncope and facial asymmetry.  Psychiatric/Behavioral:  Negative for confusion and hallucinations. The patient is nervous/anxious.   See other pertinent positives and negatives in HPI.  Current Outpatient Medications on File Prior to Visit  Medication Sig Dispense Refill   acetaminophen  (TYLENOL ) 500 MG tablet Take 1,000 mg by mouth every 8 (eight) hours as needed for moderate pain.     albuterol  (VENTOLIN  HFA) 108 (90 Base) MCG/ACT inhaler INHALE 2 PUFFS INTO THE  LUNGS EVERY 4 HOURS AS NEEDED 42.5 g 1   aspirin 81 MG chewable tablet Chew 81 mg by mouth.     Biotin 1000 MCG tablet Take by mouth.     Cholecalciferol  (VITAMIN D3) 2000 units TABS Take 1 tablet by mouth daily.     clonazePAM  (KLONOPIN ) 0.5 MG tablet TAKE 1 TABLET(0.5 MG) BY MOUTH TWICE DAILY AS NEEDED FOR ANXIETY 60 tablet 3   levothyroxine  (SYNTHROID ) 50 MCG tablet TAKE 1 TABLET(50 MCG) BY MOUTH DAILY BEFORE BREAKFAST 90 tablet 3   lidocaine  (XYLOCAINE ) 5 % ointment Apply 1 application topically as needed. 50 g 2   losartan  (COZAAR ) 100 MG tablet TAKE 1 TABLET(100 MG) BY MOUTH EVERY MORNING 90 tablet 3   meclizine  (ANTIVERT ) 25 MG tablet TAKE 1/2 TO 1 TABLET(12.5 TO 25 MG) BY MOUTH DAILY AS NEEDED FOR DIZZINESS 30 tablet 1   OVER THE COUNTER MEDICATION Apply 2 drops to eye at bedtime as needed (for itchy dry eyes.).     PROCTOZONE -HC 2.5 % rectal cream USE RECTALLY EVERY DAY AS NEEDED 30 g 1   tiZANidine  (ZANAFLEX ) 4 MG tablet TAKE 1 TABLET(4 MG) BY MOUTH EVERY 12 HOURS AS NEEDED FOR MUSCLE SPASMS 30 tablet 0   triamcinolone  cream (KENALOG ) 0.1 % APPLY TOPICALLY TO THE AFFECTED AREA TWICE DAILY 30 g 1   No current facility-administered medications on file prior to visit.   Past Medical History:  Diagnosis Date   Anemia    Anxiety    Has tried Celexa and effexor, poor tolerance.   Cataract    bilateral -left > right   Clostridium difficile diarrhea    05-09-14 denies any problems at this time   COPD (chronic obstructive pulmonary disease) (HCC)    Depression    Fibromyalgia    History of diverticulosis    Hyperlipidemia    Statins aggravated myalgias:Pravastatin  and Atorvastatin  among some.   Hypertension    Hypothyroidism    IBS (irritable bowel syndrome)    Neuromuscular disorder (HCC)    fibromyalgia   Thyroid  disease    Allergies  Allergen Reactions   Sulfa Antibiotics Other (See Comments) and Itching    Anal itching "anal itching"   Demerol  [Meperidine Hcl] Nausea  And Vomiting   Levofloxacin In D5w Other (See Comments)    Yeast infections.  "Yeast infections"   Spironolactone Other (See Comments)    Weakness/malaise "Weakness" Other reaction(s): Other (See Comments) Weakness/malaise   Clindamycin     Other reaction(s): Other (See Comments)   Clindamycin/Lincomycin     CDIF   Erythromycin  Diarrhea   Erythromycin  Base     OTHER REACTION(S): Diarrhea Other reaction(s): Other (See Comments) OTHER REACTION(S): Diarrhea   Fluconazole Other (See Comments)   Hydrochlorothiazide Other (See Comments)    Weakness/malaise Other reaction(s): Other (See Comments) Weakness/malaise   Lincomycin Diarrhea    CDIF   Meperidine Nausea And Vomiting, Other (See Comments) and Hypertension    Severe headache OTHER REACTION(S): Hypertension Severe headache   Ciprofloxacin Diarrhea  Levofloxacin Nausea Only    Yeast infection OTHER REACTION(S): Diarrhea Other reaction(s): Other (See Comments) Yeast infection   Metronidazole Nausea Only    OTHER REACTION(S): Diarrhea OTHER REACTION(S): Diarrhea    Other Itching   Social History   Socioeconomic History   Marital status: Widowed    Spouse name: Bula Jinks   Number of children: Not on file   Years of education: Not on file   Highest education level: Not on file  Occupational History   Not on file  Tobacco Use   Smoking status: Former    Current packs/day: 0.00    Average packs/day: 1.5 packs/day for 25.0 years (37.5 ttl pk-yrs)    Types: Cigarettes    Start date: 05/10/1955    Quit date: 05/09/1980    Years since quitting: 43.6   Smokeless tobacco: Never  Vaping Use   Vaping status: Never Used  Substance and Sexual Activity   Alcohol use: No    Alcohol/week: 0.0 standard drinks of alcohol   Drug use: No   Sexual activity: Not Currently  Other Topics Concern   Not on file  Social History Narrative   Not on file   Social Drivers of Health   Financial Resource Strain: High Risk  (09/02/2021)   Overall Financial Resource Strain (CARDIA)    Difficulty of Paying Living Expenses: Hard  Food Insecurity: No Food Insecurity (05/16/2022)   Hunger Vital Sign    Worried About Running Out of Food in the Last Year: Never true    Ran Out of Food in the Last Year: Never true  Transportation Needs: No Transportation Needs (05/16/2022)   PRAPARE - Administrator, Civil Service (Medical): No    Lack of Transportation (Non-Medical): No  Physical Activity: Unknown (09/02/2021)   Exercise Vital Sign    Days of Exercise per Week: Patient declined    Minutes of Exercise per Session: Not on file  Recent Concern: Physical Activity - Inactive (08/30/2021)   Exercise Vital Sign    Days of Exercise per Week: 0 days    Minutes of Exercise per Session: 0 min  Stress: Stress Concern Present (08/30/2021)   [employer] of Occupational Health - Occupational Stress Questionnaire    Feeling of Stress : To some extent  Social Connections: Moderately Integrated (06/23/2020)   Social Connection and Isolation Panel [NHANES]    Frequency of Communication with Friends and Family: Three times a week    Frequency of Social Gatherings with Friends and Family: Once a week    Attends Religious Services: More than 4 times per year    Active Member of Clubs or Organizations: No    Attends Banker Meetings: Never    Marital Status: Married   Vitals:   12/20/23 1359 12/20/23 1438  BP: (!) 180/90 (!) 160/80  Pulse: 90   Temp: 97.8 F (36.6 C)   SpO2: 98%    Body mass index is 16.32 kg/m.  Physical Exam Vitals and nursing note reviewed.  Constitutional:      General: She is not in acute distress.    Appearance: She is well-developed.  HENT:     Head: Normocephalic and atraumatic.     Mouth/Throat:     Mouth: Mucous membranes are moist.     Pharynx: Oropharynx is clear.  Eyes:     Conjunctiva/sclera: Conjunctivae normal.  Cardiovascular:     Rate and Rhythm: Normal  rate and regular rhythm.     Heart sounds:  No murmur heard.    Comments: *** Pulmonary:     Effort: Pulmonary effort is normal. No respiratory distress.     Breath sounds: Normal breath sounds.  Abdominal:     Palpations: Abdomen is soft. There is no hepatomegaly or mass.     Tenderness: There is no abdominal tenderness.     Comments: ***  Musculoskeletal:     Right foot: Normal capillary refill.     Left foot: Normal capillary refill.  Lymphadenopathy:     Cervical: No cervical adenopathy.  Skin:    General: Skin is warm.     Findings: No erythema or rash.  Neurological:     General: No focal deficit present.     Mental Status: She is alert and oriented to person, place, and time.     Cranial Nerves: No cranial nerve deficit.     Comments: Unstable gait assisted with a cane.  Psychiatric:     Comments: Well groomed, good eye contact.    ASSESSMENT AND PLAN:  Ms. Perrault was seen today for chronic disease management.   Orders Placed This Encounter  Procedures   DG Bone Density   TSH   T4, free   Comprehensive metabolic panel with GFR   CBC   Lipid panel   No problem-specific Assessment & Plan notes found for this encounter. Multiple complaints today, most are chronic and stable. Her son states that she eats well, adequate amount of protein with each meal and snacks, but she has not gained wt. Explained that wt loss could also be a sx of a more serious problem, including malignancy. We discussed the options of ahving chest CT, abdominal imaging,and maybe GI evaluation to discuss colonoscopy/EGD but she is not interested in any of these. She finally agrees with having a DEXA, which she has declined in the past, but she is not interested in medication for osteoporosis even if recommended. ***  Return in about 6 months (around 06/21/2024) for chronic problems.  I, Fritz Jewel Wierda, acting as a scribe for Brandii Lakey [place], MD., have documented all relevant documentation on the  behalf of Ledger Heindl [place], MD, as directed by  Bethanny Toelle [place], MD while in the presence of Khair Chasteen [place], MD.   I, Siena Poehler [place], MD, have reviewed all documentation for this visit. The documentation on 12/20/23 for the exam, diagnosis, procedures, and orders are all accurate and complete.  Demetria Lightsey G. [place], MD  [facility]. [place] office.

## 2023-12-20 NOTE — Assessment & Plan Note (Signed)
 BP elevated today, she reports lower BP's at home. Has tried several medications in the past and poorly tolerated. Continue Losartan  100 mg daily and low salt diet. Continue monitoring BP's at home.

## 2023-12-21 ENCOUNTER — Encounter: Payer: Self-pay | Admitting: Family Medicine

## 2023-12-21 NOTE — Assessment & Plan Note (Signed)
 Since 04/2021 when she had anterior left glenohumeral fracture dislocation and comminution and mild displacement of the greater tuberosity. Closed reduction under anesthesia on 05/13/21. Completed PT. Residual limitation of ROM and pain. Caution with oral NSAID's.  She can try topical Voltaren and continue Tylenol  500 mg 3-4 times prn.

## 2023-12-21 NOTE — Assessment & Plan Note (Signed)
 She has been following with vascular. On Aspirin 81 mfg daily. Agrees with resuming Rosuvastatin  10 mg daily.

## 2024-01-04 ENCOUNTER — Ambulatory Visit (INDEPENDENT_AMBULATORY_CARE_PROVIDER_SITE_OTHER)
Admission: RE | Admit: 2024-01-04 | Discharge: 2024-01-04 | Disposition: A | Discharge status: Home / Self Care | Source: Ambulatory Visit

## 2024-01-04 DIAGNOSIS — Z780 Asymptomatic menopausal state: Secondary | ICD-10-CM | POA: Diagnosis not present

## 2024-01-10 ENCOUNTER — Ambulatory Visit: Payer: Self-pay | Admitting: Family Medicine

## 2024-01-15 ENCOUNTER — Other Ambulatory Visit: Payer: Self-pay | Admitting: Family Medicine

## 2024-01-15 DIAGNOSIS — I10 Essential (primary) hypertension: Secondary | ICD-10-CM

## 2024-01-16 ENCOUNTER — Encounter (HOSPITAL_COMMUNITY)

## 2024-01-29 ENCOUNTER — Ambulatory Visit

## 2024-02-13 ENCOUNTER — Ambulatory Visit (HOSPITAL_BASED_OUTPATIENT_CLINIC_OR_DEPARTMENT_OTHER)

## 2024-02-13 DIAGNOSIS — I739 Peripheral vascular disease, unspecified: Secondary | ICD-10-CM | POA: Diagnosis not present

## 2024-02-14 LAB — VAS US ABI WITH/WO TBI
Left ABI: 0.89
Right ABI: 0.59

## 2024-04-09 ENCOUNTER — Other Ambulatory Visit: Payer: Self-pay | Admitting: Family Medicine

## 2024-04-09 ENCOUNTER — Telehealth: Payer: Self-pay

## 2024-04-09 DIAGNOSIS — F411 Generalized anxiety disorder: Secondary | ICD-10-CM

## 2024-04-09 MED ORDER — LEVOTHYROXINE SODIUM 50 MCG PO TABS: ORAL_TABLET | ORAL | 3 refills | Status: AC

## 2024-04-09 NOTE — Telephone Encounter (Signed)
 Copied from CRM [id number]. Topic: Clinical - Medication Refill >> Apr 09, 2024 12:30 PM Deleta S wrote: Medication: levothyroxine  (SYNTHROID ) 50 MCG tablet   Has the patient contacted their pharmacy? No (Agent: If no, request that the patient contact the pharmacy for the refill. If patient does not wish to contact the pharmacy document the reason why and proceed with request.) (Agent: If yes, when and what did the pharmacy advise?)  This is the patient's preferred pharmacy:  [id number] - [place], [place] - [address] [place] [address] Phone: [phone] Fax: [phone]  Is this the correct pharmacy for this prescription? Yes If no, delete pharmacy and type the correct one.   Has the prescription been filled recently? Yes  Is the patient out of the medication? Yes  Has the patient been seen for an appointment in the last year OR does the patient have an upcoming appointment? Yes  Can we respond through MyChart? No  Agent: Please be advised that Rx refills may take up to 3 business days. We ask that you follow-up with your pharmacy.

## 2024-04-09 NOTE — Telephone Encounter (Signed)
 Tried to contact patient for more information as med is not on her med list; unable to leave a voicemail.

## 2024-04-09 NOTE — Telephone Encounter (Signed)
 Copied from CRM #[id number]. Topic: Clinical - Medication Question >> Apr 09, 2024 12:25 PM Deleta RAMAN wrote: Reason for CRM: patient would like to know if she can be placed back on [facility]. Please contact the patient at [phone]. Patient aware of call back time

## 2024-04-10 NOTE — Telephone Encounter (Signed)
 Tried to contact patient for more information as med is not on her med list; unable to leave a voicemail.

## 2024-04-10 NOTE — Telephone Encounter (Unsigned)
 Copied from CRM [id number]. Topic: Clinical - Medication Refill >> Apr 10, 2024  5:40 PM Harlene ORN wrote: Medication: lidocaine  (XYLOCAINE ) 5 % ointment  Has the patient contacted their pharmacy? Yes (Agent: If no, request that the patient contact the pharmacy for the refill. If patient does not wish to contact the pharmacy document the reason why and proceed with request.) (Agent: If yes, when and what did the pharmacy advise?)  This is the patient's preferred pharmacy:  [id number] - [place], [place] - [id number] [place] [address] Phone: [phone] Fax: [phone]  Is this the correct pharmacy for this prescription? Yes If no, delete pharmacy and type the correct one.   Has the prescription been filled recently? Yes  Is the patient out of the medication? Yes  Has the patient been seen for an appointment in the last year OR does the patient have an upcoming appointment? Yes  Can we respond through MyChart? No  Agent: Please be advised that Rx refills may take up to 3 business days. We ask that you follow-up with your pharmacy.

## 2024-04-12 MED ORDER — LIDOCAINE 5 % EX OINT: 1.0000 | TOPICAL_OINTMENT | CUTANEOUS | 2 refills | Status: AC | PRN

## 2024-04-12 NOTE — Telephone Encounter (Signed)
 Rx sent.

## 2024-04-12 NOTE — Addendum Note (Signed)
 Addended by: EVELINE LAURAINE BRAVO on: 04/12/2024 07:11 AM   Modules accepted: Orders

## 2024-04-17 ENCOUNTER — Other Ambulatory Visit: Payer: Self-pay | Admitting: Family Medicine

## 2024-04-17 DIAGNOSIS — R42 Dizziness and giddiness: Secondary | ICD-10-CM

## 2024-06-24 ENCOUNTER — Ambulatory Visit: Admitting: Family Medicine

## 2024-07-02 ENCOUNTER — Other Ambulatory Visit: Payer: Self-pay | Admitting: Family Medicine

## 2024-07-02 DIAGNOSIS — R42 Dizziness and giddiness: Secondary | ICD-10-CM

## 2024-07-06 ENCOUNTER — Other Ambulatory Visit: Payer: Self-pay | Admitting: Family Medicine

## 2024-07-06 DIAGNOSIS — J441 Chronic obstructive pulmonary disease with (acute) exacerbation: Secondary | ICD-10-CM

## 2024-07-07 ENCOUNTER — Other Ambulatory Visit: Payer: Self-pay | Admitting: Family Medicine

## 2024-07-07 DIAGNOSIS — J441 Chronic obstructive pulmonary disease with (acute) exacerbation: Secondary | ICD-10-CM

## 2024-09-06 ENCOUNTER — Other Ambulatory Visit: Payer: Self-pay | Admitting: Family Medicine

## 2024-09-06 DIAGNOSIS — F411 Generalized anxiety disorder: Secondary | ICD-10-CM
